# Patient Record
Sex: Female | Born: 1950 | Race: Black or African American | Hispanic: No | Smoking: Current every day smoker
Health system: Southern US, Community
[De-identification: ages and names within clinical notes are randomized; demographics above are authoritative.]

## PROBLEM LIST (undated history)

## (undated) DIAGNOSIS — I10 Essential (primary) hypertension: Secondary | ICD-10-CM

## (undated) DIAGNOSIS — M1990 Unspecified osteoarthritis, unspecified site: Secondary | ICD-10-CM

## (undated) DIAGNOSIS — F1010 Alcohol abuse, uncomplicated: Secondary | ICD-10-CM

## (undated) DIAGNOSIS — M549 Dorsalgia, unspecified: Secondary | ICD-10-CM

## (undated) DIAGNOSIS — F329 Major depressive disorder, single episode, unspecified: Secondary | ICD-10-CM

## (undated) DIAGNOSIS — R7303 Prediabetes: Secondary | ICD-10-CM

## (undated) DIAGNOSIS — S42302A Unspecified fracture of shaft of humerus, left arm, initial encounter for closed fracture: Secondary | ICD-10-CM

## (undated) DIAGNOSIS — K859 Acute pancreatitis without necrosis or infection, unspecified: Secondary | ICD-10-CM

## (undated) DIAGNOSIS — G8929 Other chronic pain: Secondary | ICD-10-CM

## (undated) DIAGNOSIS — K259 Gastric ulcer, unspecified as acute or chronic, without hemorrhage or perforation: Secondary | ICD-10-CM

## (undated) DIAGNOSIS — F419 Anxiety disorder, unspecified: Secondary | ICD-10-CM

## (undated) DIAGNOSIS — F32A Depression, unspecified: Secondary | ICD-10-CM

## (undated) DIAGNOSIS — C50912 Malignant neoplasm of unspecified site of left female breast: Secondary | ICD-10-CM

## (undated) DIAGNOSIS — K219 Gastro-esophageal reflux disease without esophagitis: Secondary | ICD-10-CM

## (undated) HISTORY — PX: TONSILLECTOMY: SUR1361

## (undated) HISTORY — PX: ABDOMINAL HYSTERECTOMY: SHX81

## (undated) HISTORY — PX: MASTECTOMY: SHX3

## (undated) HISTORY — PX: BREAST BIOPSY: SHX20

---

## 1997-07-12 ENCOUNTER — Emergency Department (HOSPITAL_COMMUNITY): Admission: EM | Admit: 1997-07-12 | Discharge: 1997-07-12 | Payer: Self-pay | Admitting: Emergency Medicine

## 1999-04-08 ENCOUNTER — Emergency Department (HOSPITAL_COMMUNITY): Admission: EM | Admit: 1999-04-08 | Discharge: 1999-04-08 | Payer: Self-pay | Admitting: Emergency Medicine

## 2000-09-01 ENCOUNTER — Other Ambulatory Visit: Admission: RE | Admit: 2000-09-01 | Discharge: 2000-09-01 | Payer: Self-pay | Admitting: Radiology

## 2000-09-13 ENCOUNTER — Other Ambulatory Visit: Admission: RE | Admit: 2000-09-13 | Discharge: 2000-09-13 | Payer: Self-pay | Admitting: Oncology

## 2000-09-13 ENCOUNTER — Encounter: Payer: Self-pay | Admitting: Oncology

## 2000-09-13 ENCOUNTER — Encounter: Admission: RE | Admit: 2000-09-13 | Discharge: 2000-09-13 | Payer: Self-pay | Admitting: Oncology

## 2000-10-20 ENCOUNTER — Other Ambulatory Visit: Admission: RE | Admit: 2000-10-20 | Discharge: 2000-10-20 | Payer: Self-pay | Admitting: Obstetrics and Gynecology

## 2000-10-25 ENCOUNTER — Encounter: Payer: Self-pay | Admitting: Obstetrics and Gynecology

## 2000-10-25 ENCOUNTER — Encounter: Admission: RE | Admit: 2000-10-25 | Discharge: 2000-10-25 | Payer: Self-pay | Admitting: Obstetrics and Gynecology

## 2000-10-26 ENCOUNTER — Inpatient Hospital Stay (HOSPITAL_COMMUNITY): Admission: RE | Admit: 2000-10-26 | Discharge: 2000-10-28 | Payer: Self-pay | Admitting: General Surgery

## 2000-10-26 ENCOUNTER — Encounter: Payer: Self-pay | Admitting: General Surgery

## 2001-01-04 ENCOUNTER — Encounter: Payer: Self-pay | Admitting: Oncology

## 2001-01-04 ENCOUNTER — Ambulatory Visit (HOSPITAL_COMMUNITY): Admission: RE | Admit: 2001-01-04 | Discharge: 2001-01-04 | Payer: Self-pay | Admitting: Oncology

## 2001-01-10 ENCOUNTER — Ambulatory Visit (HOSPITAL_COMMUNITY): Admission: RE | Admit: 2001-01-10 | Discharge: 2001-01-10 | Payer: Self-pay | Admitting: Oncology

## 2001-01-10 ENCOUNTER — Encounter: Payer: Self-pay | Admitting: Oncology

## 2001-02-10 ENCOUNTER — Inpatient Hospital Stay (HOSPITAL_COMMUNITY): Admission: RE | Admit: 2001-02-10 | Discharge: 2001-02-13 | Payer: Self-pay | Admitting: Obstetrics and Gynecology

## 2002-03-20 ENCOUNTER — Encounter: Payer: Self-pay | Admitting: General Surgery

## 2002-03-20 ENCOUNTER — Ambulatory Visit (HOSPITAL_COMMUNITY): Admission: RE | Admit: 2002-03-20 | Discharge: 2002-03-20 | Payer: Self-pay | Admitting: General Surgery

## 2002-04-25 ENCOUNTER — Encounter: Admission: RE | Admit: 2002-04-25 | Discharge: 2002-07-06 | Payer: Self-pay | Admitting: General Surgery

## 2002-07-24 ENCOUNTER — Ambulatory Visit (HOSPITAL_BASED_OUTPATIENT_CLINIC_OR_DEPARTMENT_OTHER): Admission: RE | Admit: 2002-07-24 | Discharge: 2002-07-24 | Payer: Self-pay | Admitting: Specialist

## 2002-08-24 ENCOUNTER — Ambulatory Visit (HOSPITAL_COMMUNITY): Admission: RE | Admit: 2002-08-24 | Discharge: 2002-08-24 | Payer: Self-pay | Admitting: Orthopedic Surgery

## 2002-08-29 ENCOUNTER — Encounter: Admission: RE | Admit: 2002-08-29 | Discharge: 2002-09-11 | Payer: Self-pay | Admitting: Orthopedic Surgery

## 2002-10-06 ENCOUNTER — Emergency Department (HOSPITAL_COMMUNITY): Admission: EM | Admit: 2002-10-06 | Discharge: 2002-10-06 | Payer: Self-pay | Admitting: Emergency Medicine

## 2005-03-19 ENCOUNTER — Emergency Department (HOSPITAL_COMMUNITY): Admission: EM | Admit: 2005-03-19 | Discharge: 2005-03-19 | Payer: Self-pay | Admitting: Emergency Medicine

## 2011-02-14 ENCOUNTER — Emergency Department (HOSPITAL_COMMUNITY)
Admission: EM | Admit: 2011-02-14 | Discharge: 2011-02-15 | Disposition: A | Discharge status: Home / Self Care | Payer: Medicaid Other | Attending: Emergency Medicine | Admitting: Emergency Medicine

## 2011-02-14 DIAGNOSIS — G8929 Other chronic pain: Secondary | ICD-10-CM | POA: Insufficient documentation

## 2011-02-14 DIAGNOSIS — M549 Dorsalgia, unspecified: Secondary | ICD-10-CM | POA: Insufficient documentation

## 2011-02-14 DIAGNOSIS — E119 Type 2 diabetes mellitus without complications: Secondary | ICD-10-CM | POA: Insufficient documentation

## 2011-02-14 DIAGNOSIS — M129 Arthropathy, unspecified: Secondary | ICD-10-CM | POA: Insufficient documentation

## 2011-02-14 DIAGNOSIS — I10 Essential (primary) hypertension: Secondary | ICD-10-CM | POA: Insufficient documentation

## 2011-02-14 DIAGNOSIS — F10929 Alcohol use, unspecified with intoxication, unspecified: Secondary | ICD-10-CM

## 2011-02-14 DIAGNOSIS — H543 Unqualified visual loss, both eyes: Secondary | ICD-10-CM | POA: Insufficient documentation

## 2011-02-14 DIAGNOSIS — R5381 Other malaise: Secondary | ICD-10-CM | POA: Insufficient documentation

## 2011-02-14 DIAGNOSIS — F1010 Alcohol abuse, uncomplicated: Secondary | ICD-10-CM | POA: Insufficient documentation

## 2011-02-14 HISTORY — DX: Unspecified osteoarthritis, unspecified site: M19.90

## 2011-02-14 HISTORY — DX: Essential (primary) hypertension: I10

## 2011-02-14 LAB — RAPID URINE DRUG SCREEN, HOSP PERFORMED
Amphetamines: NOT DETECTED
Benzodiazepines: NOT DETECTED
Opiates: NOT DETECTED

## 2011-02-14 LAB — COMPREHENSIVE METABOLIC PANEL
Alkaline Phosphatase: 71 U/L (ref 39–117)
BUN: 8 mg/dL (ref 6–23)
Calcium: 9.3 mg/dL (ref 8.4–10.5)
GFR calc Af Amer: >90 mL/min (ref >90)
Glucose, Bld: 97 mg/dL (ref 70–99)
Total Protein: 6.9 g/dL (ref 6.0–8.3)

## 2011-02-14 LAB — CBC
MCH: 29.8 pg (ref 26.0–34.0)
MCV: 84 fL (ref 78.0–100.0)
Platelets: 468 10*3/uL — ABNORMAL HIGH (ref 150–400)
RBC: 4.43 MIL/uL (ref 3.87–5.11)

## 2011-02-14 LAB — ETHANOL: Alcohol, Ethyl (B): 381 mg/dL — ABNORMAL HIGH (ref 0–11)

## 2011-02-15 ENCOUNTER — Encounter: Payer: Self-pay | Admitting: *Deleted

## 2011-02-15 NOTE — ED Notes (Signed)
Pt a/ox3, c/o chronic back pain, called husband to pick her up

## 2011-02-15 NOTE — ED Notes (Addendum)
Report given Anna Shell 

## 2011-02-15 NOTE — ED Notes (Signed)
Working with Linton Rump RN until she can log into epic

## 2011-02-15 NOTE — ED Notes (Signed)
Pt resting comfortably, NAD, awaiting disposition

## 2011-02-15 NOTE — ED Notes (Signed)
Pt resting quietly with eyes closed, continued monitoring,  NAD

## 2011-02-15 NOTE — ED Notes (Signed)
Entered in for EPIC go live only

## 2011-05-05 ENCOUNTER — Ambulatory Visit (HOSPITAL_COMMUNITY): Admission: RE | Admit: 2011-05-05 | Payer: Medicaid Other | Source: Ambulatory Visit

## 2011-05-18 ENCOUNTER — Ambulatory Visit (HOSPITAL_COMMUNITY): Payer: Medicaid Other

## 2011-05-18 ENCOUNTER — Ambulatory Visit (HOSPITAL_COMMUNITY): Payer: Medicaid Other | Attending: Internal Medicine

## 2011-05-18 DIAGNOSIS — R0602 Shortness of breath: Secondary | ICD-10-CM | POA: Insufficient documentation

## 2011-05-18 DIAGNOSIS — R5381 Other malaise: Secondary | ICD-10-CM | POA: Insufficient documentation

## 2011-05-19 ENCOUNTER — Telehealth: Payer: Self-pay

## 2011-05-19 NOTE — Telephone Encounter (Signed)
Patient husband called please contact him concerning a lot of questions he has about her medical conditions.

## 2011-05-20 NOTE — Telephone Encounter (Signed)
Spoke with pts fiance who vented to me at length about bad health care that pt has had in the past, but he seemed to like Dr Audria Nine and what she had done for pt. He had a question about ESR and what it showed but said he had talked w/ someone yesterday and gotten answer.

## 2012-04-13 DIAGNOSIS — K859 Acute pancreatitis without necrosis or infection, unspecified: Secondary | ICD-10-CM

## 2012-04-13 DIAGNOSIS — F1010 Alcohol abuse, uncomplicated: Secondary | ICD-10-CM

## 2012-04-13 HISTORY — DX: Acute pancreatitis without necrosis or infection, unspecified: K85.90

## 2012-04-13 HISTORY — DX: Alcohol abuse, uncomplicated: F10.10

## 2012-05-23 ENCOUNTER — Emergency Department (HOSPITAL_COMMUNITY): Payer: Medicaid Other

## 2012-05-23 ENCOUNTER — Encounter (HOSPITAL_COMMUNITY): Payer: Self-pay | Admitting: Emergency Medicine

## 2012-05-23 ENCOUNTER — Emergency Department (HOSPITAL_COMMUNITY)
Admission: EM | Admit: 2012-05-23 | Discharge: 2012-05-23 | Disposition: A | Discharge status: Home / Self Care | Payer: Medicaid Other | Attending: Emergency Medicine | Admitting: Emergency Medicine

## 2012-05-23 DIAGNOSIS — K859 Acute pancreatitis without necrosis or infection, unspecified: Secondary | ICD-10-CM | POA: Insufficient documentation

## 2012-05-23 DIAGNOSIS — R197 Diarrhea, unspecified: Secondary | ICD-10-CM | POA: Insufficient documentation

## 2012-05-23 DIAGNOSIS — Z8739 Personal history of other diseases of the musculoskeletal system and connective tissue: Secondary | ICD-10-CM | POA: Insufficient documentation

## 2012-05-23 DIAGNOSIS — R630 Anorexia: Secondary | ICD-10-CM | POA: Insufficient documentation

## 2012-05-23 DIAGNOSIS — R110 Nausea: Secondary | ICD-10-CM | POA: Insufficient documentation

## 2012-05-23 DIAGNOSIS — E119 Type 2 diabetes mellitus without complications: Secondary | ICD-10-CM | POA: Insufficient documentation

## 2012-05-23 DIAGNOSIS — I10 Essential (primary) hypertension: Secondary | ICD-10-CM | POA: Insufficient documentation

## 2012-05-23 DIAGNOSIS — R059 Cough, unspecified: Secondary | ICD-10-CM | POA: Insufficient documentation

## 2012-05-23 DIAGNOSIS — R948 Abnormal results of function studies of other organs and systems: Secondary | ICD-10-CM | POA: Insufficient documentation

## 2012-05-23 DIAGNOSIS — F1010 Alcohol abuse, uncomplicated: Secondary | ICD-10-CM | POA: Insufficient documentation

## 2012-05-23 DIAGNOSIS — Z853 Personal history of malignant neoplasm of breast: Secondary | ICD-10-CM | POA: Insufficient documentation

## 2012-05-23 DIAGNOSIS — R05 Cough: Secondary | ICD-10-CM | POA: Insufficient documentation

## 2012-05-23 DIAGNOSIS — H543 Unqualified visual loss, both eyes: Secondary | ICD-10-CM | POA: Insufficient documentation

## 2012-05-23 HISTORY — DX: Malignant neoplasm of unspecified site of left female breast: C50.912

## 2012-05-23 LAB — COMPREHENSIVE METABOLIC PANEL
Albumin: 4.2 g/dL (ref 3.5–5.2)
BUN: 8 mg/dL (ref 6–23)
Calcium: 10 mg/dL (ref 8.4–10.5)
Creatinine, Ser: 0.83 mg/dL (ref 0.50–1.10)
GFR calc Af Amer: 87 mL/min — ABNORMAL LOW (ref >90)
Total Protein: 8 g/dL (ref 6.0–8.3)

## 2012-05-23 LAB — LIPASE, BLOOD: Lipase: 67 U/L — ABNORMAL HIGH (ref 11–59)

## 2012-05-23 LAB — CBC WITH DIFFERENTIAL/PLATELET
Basophils Relative: 0 % (ref 0–1)
Eosinophils Absolute: 0.1 10*3/uL (ref 0.0–0.7)
Eosinophils Relative: 0 % (ref 0–5)
HCT: 45 % (ref 36.0–46.0)
Hemoglobin: 16.5 g/dL — ABNORMAL HIGH (ref 12.0–15.0)
MCH: 31.9 pg (ref 26.0–34.0)
MCHC: 36.7 g/dL — ABNORMAL HIGH (ref 30.0–36.0)
MCV: 87 fL (ref 78.0–100.0)
Monocytes Absolute: 0.7 10*3/uL (ref 0.1–1.0)
Monocytes Relative: 5 % (ref 3–12)
Neutrophils Relative %: 71 % (ref 43–77)

## 2012-05-23 MED ORDER — PROMETHAZINE HCL 25 MG PO TABS: 25.0000 mg | ORAL_TABLET | Freq: Four times a day (QID) | ORAL | Status: DC | PRN

## 2012-05-23 MED ORDER — SODIUM CHLORIDE 0.9 % IV BOLUS (SEPSIS)
1000.0000 mL | Freq: Once | INTRAVENOUS | Status: AC
  Administered 2012-05-23: 1000 mL via INTRAVENOUS

## 2012-05-23 MED ORDER — MORPHINE SULFATE 4 MG/ML IJ SOLN
4.0000 mg | Freq: Once | INTRAMUSCULAR | Status: AC
  Administered 2012-05-23: 4 mg via INTRAVENOUS
  Filled 2012-05-23: qty 1

## 2012-05-23 MED ORDER — ONDANSETRON HCL 4 MG/2ML IJ SOLN
4.0000 mg | Freq: Once | INTRAMUSCULAR | Status: AC
  Administered 2012-05-23: 4 mg via INTRAVENOUS
  Filled 2012-05-23: qty 2

## 2012-05-23 MED ORDER — IOHEXOL 300 MG/ML  SOLN
25.0000 mL | INTRAMUSCULAR | Status: AC
  Administered 2012-05-23 (×2): 25 mL via ORAL

## 2012-05-23 MED ORDER — IOHEXOL 300 MG/ML  SOLN
100.0000 mL | Freq: Once | INTRAMUSCULAR | Status: AC | PRN
  Administered 2012-05-23: 100 mL via INTRAVENOUS

## 2012-05-23 MED ORDER — OXYCODONE-ACETAMINOPHEN 5-325 MG PO TABS: 1.0000 | ORAL_TABLET | Freq: Four times a day (QID) | ORAL | Status: DC | PRN

## 2012-05-23 NOTE — ED Notes (Signed)
CT notified that pt has finished contrast.  

## 2012-05-23 NOTE — ED Provider Notes (Signed)
History     CSN: [id number]  Arrival date & time 05/23/12  1053   First MD Initiated Contact with Patient 05/23/12 1106      Chief Complaint  Patient presents with  . Abdominal Pain    (Consider location/radiation/quality/duration/timing/severity/associated sxs/prior treatment) Patient is a 62 y.o. female presenting with abdominal pain. The history is provided by the patient.  Abdominal Pain Associated symptoms: diarrhea and nausea   Associated symptoms: no chest pain, no fever, no shortness of breath and no vomiting    patient's had abdominal pain for last day and a half. States it is on her upper abdomen and goes to both flanks. It is worse on either flank if she lays on that side. She's had nausea and a decreased appetite. No fevers. She states she's had diarrhea. The pain is constant. She is eating less. She's had a cough with is chronic for her. No blood in stool or melena. She states she's been seen for this before and was told was her cholesterol. Pain does radiate to her back. Past Medical History  Diagnosis Date  . Arthritis   . Diabetes mellitus   . Hypertension   . Alcohol use   . Blindness   . Cancer   . Breast cancer, left breast     History reviewed. No pertinent past surgical history.  History reviewed. No pertinent family history.  History  Substance Use Topics  . Smoking status: Not on file  . Smokeless tobacco: Not on file  . Alcohol Use: Yes     Comment: heavy drinker    OB History   Grav Para Term Preterm Abortions TAB SAB Ect Mult Living                  Review of Systems  Constitutional: Positive for appetite change. Negative for fever and activity change.  HENT: Negative for neck stiffness.   Eyes: Negative for pain.  Respiratory: Negative for chest tightness and shortness of breath.   Cardiovascular: Negative for chest pain and leg swelling.  Gastrointestinal: Positive for nausea, abdominal pain and diarrhea. Negative for vomiting.   Endocrine: Negative for polyuria.  Genitourinary: Negative for flank pain.  Musculoskeletal: Negative for back pain.  Skin: Negative for rash.  Neurological: Negative for weakness, numbness and headaches.  Psychiatric/Behavioral: Negative for behavioral problems.    Allergies  Review of patient's allergies indicates no known allergies.  Home Medications   Current Outpatient Rx  Name  Route  Sig  Dispense  Refill  . ibuprofen (ADVIL,MOTRIN) 200 MG tablet   Oral   Take 600 mg by mouth every 6 (six) hours as needed for pain.         Marland Kitchen OVER THE COUNTER MEDICATION   Oral   Take 3 tablets by mouth at bedtime. Over the Counter sleep aid -- patient takes 3 tablets every night         . oxyCODONE-acetaminophen (PERCOCET/ROXICET) 5-325 MG per tablet   Oral   Take 1-2 tablets by mouth every 6 (six) hours as needed for pain.   15 tablet   0   . promethazine (PHENERGAN) 25 MG tablet   Oral   Take 1 tablet (25 mg total) by mouth every 6 (six) hours as needed for nausea.   20 tablet   0     BP 151/88  Pulse 98  Temp(Src) 99.2 F (37.3 C) (Oral)  Resp 14  SpO2 97%  Physical Exam  Nursing note and vitals reviewed.  Constitutional: She is oriented to person, place, and time. She appears well-developed and well-nourished.  HENT:  Head: Normocephalic and atraumatic.  Eyes: EOM are normal. Pupils are equal, round, and reactive to light.  Neck: Normal range of motion. Neck supple.  Cardiovascular: Normal rate, regular rhythm and normal heart sounds.   No murmur heard. Pulmonary/Chest: Effort normal. No respiratory distress. She has no wheezes. She has no rales.  Harsh breath sounds in bilateral lung fields  Abdominal: Soft. Bowel sounds are normal. She exhibits no distension. There is tenderness. There is no rebound and no guarding.  Minimal upper abdominal tenderness without CVA tenderness.  Musculoskeletal: Normal range of motion.  Neurological: She is alert and oriented to  person, place, and time. No cranial nerve deficit.  Skin: Skin is warm and dry.  Psychiatric: She has a normal mood and affect. Her speech is normal.    ED Course  Procedures (including critical care time)  Labs Reviewed  CBC WITH DIFFERENTIAL - Abnormal; Notable for the following:    WBC 14.1 (*)    RBC 5.17 (*)    Hemoglobin 16.5 (*)    MCHC 36.7 (*)    Platelets 403 (*)    Neutro Abs 10.0 (*)    All other components within normal limits  COMPREHENSIVE METABOLIC PANEL - Abnormal; Notable for the following:    Sodium 132 (*)    Chloride 95 (*)    Glucose, Bld 115 (*)    GFR calc non Af Amer 75 (*)    GFR calc Af Amer 87 (*)    All other components within normal limits  LIPASE, BLOOD - Abnormal; Notable for the following:    Lipase 67 (*)    All other components within normal limits   Dg Chest 2 View  05/23/2012  *RADIOLOGY REPORT*  Clinical Data: Cough.  CHEST - 2 VIEW  Comparison: None.  Findings: The heart size is normal.  The lungs are clear.  A port projects over the left axilla.  The patient is status post left mastectomy.  Surgical clips are present in the left axilla as well. The visualized soft tissues and bony thorax are otherwise unremarkable.  IMPRESSION:  1.  No acute cardiopulmonary disease. 2.  Status post left mastectomy and axillary node dissection.   Original Report Authenticated By: Marin Roberts, M.D.    Ct Abdomen Pelvis W Contrast  05/23/2012  *RADIOLOGY REPORT*  Clinical Data: generalized abdominal pain.  Elevated lipase.  CT ABDOMEN AND PELVIS WITH CONTRAST  Technique:  Multidetector CT imaging of the abdomen and pelvis was performed following the standard protocol during bolus administration of intravenous contrast.  Contrast: OMNIPAQUE IOHEXOL 300 MG/ML  SOLN  Comparison: None.  Findings: Lung bases are clear.  No effusions.  Heart is normal size.  Mild diffuse fatty infiltration of the liver. Small low-density area peripherally in the right  hepatic lobe, likely small cyst. There is calcification along the wall of the gallbladder fundus. No definite layering stones in the gallbladder.  There are is an ill-defined area of low density within the pancreatic head with surrounding inflammatory stranding.  Centrally within this area of irregular low density, there is a linear high density structure.  This may represent an area of focal acute pancreatitis superimposed on chronic pancreatitis.  The high density structure could conceivably represent a surgical clip. Recommend clinical correlation for prior instrumentation in the region of the pancreatic head.  Given the appearance of the pancreatic head, follow-up and  further characterization is recommended after symptoms resolve (1-2 months) with MRI to exclude underlying carcinoma.  Spleen, adrenals, kidneys are unremarkable except for small cortical cysts in the kidneys.  No hydronephrosis.  Appendix is visualized and is normal. Bowel grossly unremarkable. No free fluid, free air, or adenopathy.  Aorta and iliac vessels are calcified, non-aneurysmal.  No acute bony abnormality.  IMPRESSION: Focal area of low density within the pancreatic head could represent focal pancreatitis or pancreatic carcinoma.  There is a linear central high density structure, presumably calcification, but also could be a surgical clip or foreign body if there is been prior instrumentation in this area. Recommend further characterization and follow up after acute symptoms resolve (1-2 months ideally) with MRI to further assess this area and exclude underlying carcinoma.  Fatty infiltration of the liver.   Original Report Authenticated By: Charlett Nose, M.D.      1. Pancreatitis       MDM  Patient with abdominal pain nausea decreased appetite. Found to have pancreatitis on lab work with mildly elevated lipase. CT shows focal area of possible pancreatitis or carcinoma and pancreatic head. Patient was informed of this fact.  She's tolerated orals here and feels better. She was discharged home and will follow with her primary care Dr.        Juliet Rude. Rubin Payor, MD 05/23/12 2015

## 2012-05-23 NOTE — ED Notes (Signed)
Liquid Diet ordered spoke with [facility]

## 2012-05-23 NOTE — ED Notes (Signed)
Per EMS: pt c/o generalized abd pain into back; pt noncompliant with medications per EMS

## 2013-01-15 ENCOUNTER — Encounter (HOSPITAL_COMMUNITY): Payer: Self-pay | Admitting: *Deleted

## 2013-01-15 ENCOUNTER — Emergency Department (HOSPITAL_COMMUNITY)
Admission: EM | Admit: 2013-01-15 | Discharge: 2013-01-15 | Discharge status: Against Medical Advice | Payer: Medicaid Other | Attending: Emergency Medicine | Admitting: Emergency Medicine

## 2013-01-15 DIAGNOSIS — I10 Essential (primary) hypertension: Secondary | ICD-10-CM | POA: Insufficient documentation

## 2013-01-15 DIAGNOSIS — M129 Arthropathy, unspecified: Secondary | ICD-10-CM | POA: Insufficient documentation

## 2013-01-15 DIAGNOSIS — H539 Unspecified visual disturbance: Secondary | ICD-10-CM | POA: Insufficient documentation

## 2013-01-15 DIAGNOSIS — Z853 Personal history of malignant neoplasm of breast: Secondary | ICD-10-CM | POA: Insufficient documentation

## 2013-01-15 DIAGNOSIS — H0019 Chalazion unspecified eye, unspecified eyelid: Secondary | ICD-10-CM | POA: Insufficient documentation

## 2013-01-15 DIAGNOSIS — H5710 Ocular pain, unspecified eye: Secondary | ICD-10-CM | POA: Insufficient documentation

## 2013-01-15 DIAGNOSIS — Z79899 Other long term (current) drug therapy: Secondary | ICD-10-CM | POA: Insufficient documentation

## 2013-01-15 DIAGNOSIS — E119 Type 2 diabetes mellitus without complications: Secondary | ICD-10-CM | POA: Insufficient documentation

## 2013-01-15 DIAGNOSIS — Z7982 Long term (current) use of aspirin: Secondary | ICD-10-CM | POA: Insufficient documentation

## 2013-01-15 DIAGNOSIS — F17200 Nicotine dependence, unspecified, uncomplicated: Secondary | ICD-10-CM | POA: Insufficient documentation

## 2013-01-15 DIAGNOSIS — H0013 Chalazion right eye, unspecified eyelid: Secondary | ICD-10-CM

## 2013-01-15 MED ORDER — TETRACAINE HCL 0.5 % OP SOLN
2.0000 [drp] | Freq: Once | OPHTHALMIC | Status: AC
  Administered 2013-01-15: 2 [drp] via OPHTHALMIC
  Filled 2013-01-15: qty 2

## 2013-01-15 NOTE — ED Notes (Signed)
Pt stated that she was no longer going to allow treatment to her eye.  She stated it felt better and she was going to leave

## 2013-01-15 NOTE — ED Provider Notes (Signed)
CSN: [id number]     Arrival date & time 01/15/13  0127 History   First MD Initiated Contact with Patient 01/15/13 0146     Chief Complaint  Patient presents with  . Foreign Body in Eye   (Consider location/radiation/quality/duration/timing/severity/associated sxs/prior Treatment) HPI Comments: Patient states, that for the past 3, days.  She's had a foreign body sensation in her right eye it moves locations from upper to lower now.  She has some blurry vision, as well.  No exudate.  Oral erythema.  No known foreign bodies to the eye, but she states she was out in her garden when she first noticed this  Patient is a 62 y.o. female presenting with foreign body in eye. The history is provided by the patient.  Foreign Body in Eye This is a new problem. The current episode started in the past 7 days. The problem occurs constantly. The problem has been gradually worsening. Pertinent negatives include no congestion, fever or headaches. Exacerbated by: blinking. Treatments tried: Patient is irrigated with water x2. The treatment provided no relief.    Past Medical History  Diagnosis Date  . Arthritis   . Diabetes mellitus   . Hypertension   . Alcohol use   . Blindness   . Cancer   . Breast cancer, left breast    History reviewed. No pertinent past surgical history. History reviewed. No pertinent family history. History  Substance Use Topics  . Smoking status: Current Every Day Smoker  . Smokeless tobacco: Not on file  . Alcohol Use: Yes     Comment: heavy drinker   OB History   Grav Para Term Preterm Abortions TAB SAB Ect Mult Living                 Review of Systems  Constitutional: Negative for fever.  HENT: Negative for congestion and rhinorrhea.   Eyes: Positive for pain and visual disturbance. Negative for photophobia, discharge and redness.  Skin: Negative for wound.  Neurological: Negative for dizziness and headaches.  All other systems reviewed and are  negative.    Allergies  Review of patient's allergies indicates no known allergies.  Home Medications   Current Outpatient Rx  Name  Route  Sig  Dispense  Refill  . amLODipine (NORVASC) 10 MG tablet   Oral   Take 10 mg by mouth daily.         Marland Kitchen aspirin EC 81 MG tablet   Oral   Take 81 mg by mouth daily.         . hydrochlorothiazide (HYDRODIURIL) 25 MG tablet   Oral   Take 25 mg by mouth daily.         Marland Kitchen ibuprofen (ADVIL,MOTRIN) 200 MG tablet   Oral   Take 600 mg by mouth every 6 (six) hours as needed for pain.         Marland Kitchen lovastatin (MEVACOR) 20 MG tablet   Oral   Take 20 mg by mouth at bedtime.         Marland Kitchen OVER THE COUNTER MEDICATION   Oral   Take 3 tablets by mouth at bedtime. Over the Counter sleep aid -- patient takes 3 tablets every night         . oxyCODONE-acetaminophen (PERCOCET) 10-325 MG per tablet   Oral   Take 1-2 tablets by mouth every 6 (six) hours as needed for pain.         . promethazine (PHENERGAN) 25 MG tablet   Oral  Take 1 tablet (25 mg total) by mouth every 6 (six) hours as needed for nausea.   20 tablet   0    BP 112/69  Pulse 94  Temp(Src) 97.8 F (36.6 C) (Oral)  Resp 18  SpO2 96% Physical Exam  Nursing note and vitals reviewed. Constitutional: She is oriented to person, place, and time. She appears well-nourished.  HENT:  Head: Normocephalic.  Right Ear: External ear normal.  Left Ear: External ear normal.  Eyes: Pupils are equal, round, and reactive to light. Right eye exhibits no discharge and no hordeolum. Right conjunctiva is not injected. Right conjunctiva has no hemorrhage.    Cardiovascular: Normal rate.   Pulmonary/Chest: Effort normal.  Musculoskeletal: Normal range of motion.  Neurological: She is alert and oriented to person, place, and time.  Skin: Skin is warm and dry.    ED Course  Procedures (including critical care time) Labs Review Labs Reviewed - No data to display Imaging Review No  results found.  MDM  No diagnosis found.  Physical exam makes the believe this is a chalazion, but due to patient's symptoms, I will continue with irrigation and reassessment Patient left prior to completion of irrigation.    Arman Filter, NP 01/15/13 [phone]

## 2013-01-15 NOTE — ED Provider Notes (Signed)
Medical screening examination/treatment/procedure(s) were performed by non-physician practitioner and as supervising physician I was immediately available for consultation/collaboration.  Marcial Pless, MD 01/15/13 0711 

## 2013-01-15 NOTE — ED Notes (Signed)
Per EMS: pt states that she feels like she has a foreign body in her left eye.

## 2013-02-20 ENCOUNTER — Encounter (HOSPITAL_COMMUNITY): Payer: Self-pay | Admitting: Emergency Medicine

## 2013-02-20 ENCOUNTER — Emergency Department (HOSPITAL_COMMUNITY)
Admission: EM | Admit: 2013-02-20 | Discharge: 2013-02-20 | Disposition: A | Discharge status: Home / Self Care | Payer: Medicaid Other | Attending: Emergency Medicine | Admitting: Emergency Medicine

## 2013-02-20 DIAGNOSIS — R221 Localized swelling, mass and lump, neck: Secondary | ICD-10-CM

## 2013-02-20 DIAGNOSIS — Z853 Personal history of malignant neoplasm of breast: Secondary | ICD-10-CM | POA: Insufficient documentation

## 2013-02-20 DIAGNOSIS — Z8669 Personal history of other diseases of the nervous system and sense organs: Secondary | ICD-10-CM | POA: Insufficient documentation

## 2013-02-20 DIAGNOSIS — Z79899 Other long term (current) drug therapy: Secondary | ICD-10-CM | POA: Insufficient documentation

## 2013-02-20 DIAGNOSIS — J029 Acute pharyngitis, unspecified: Secondary | ICD-10-CM | POA: Insufficient documentation

## 2013-02-20 DIAGNOSIS — M129 Arthropathy, unspecified: Secondary | ICD-10-CM | POA: Insufficient documentation

## 2013-02-20 DIAGNOSIS — R109 Unspecified abdominal pain: Secondary | ICD-10-CM

## 2013-02-20 DIAGNOSIS — Z7982 Long term (current) use of aspirin: Secondary | ICD-10-CM | POA: Insufficient documentation

## 2013-02-20 DIAGNOSIS — F17200 Nicotine dependence, unspecified, uncomplicated: Secondary | ICD-10-CM | POA: Insufficient documentation

## 2013-02-20 DIAGNOSIS — R1084 Generalized abdominal pain: Secondary | ICD-10-CM | POA: Insufficient documentation

## 2013-02-20 DIAGNOSIS — I10 Essential (primary) hypertension: Secondary | ICD-10-CM | POA: Insufficient documentation

## 2013-02-20 DIAGNOSIS — E119 Type 2 diabetes mellitus without complications: Secondary | ICD-10-CM | POA: Insufficient documentation

## 2013-02-20 DIAGNOSIS — R220 Localized swelling, mass and lump, head: Secondary | ICD-10-CM | POA: Insufficient documentation

## 2013-02-20 LAB — URINALYSIS, ROUTINE W REFLEX MICROSCOPIC
Glucose, UA: NEGATIVE mg/dL
Ketones, ur: NEGATIVE mg/dL
Protein, ur: NEGATIVE mg/dL
pH: 5 (ref 5.0–8.0)

## 2013-02-20 LAB — COMPREHENSIVE METABOLIC PANEL
AST: 26 U/L (ref 0–37)
Albumin: 4.1 g/dL (ref 3.5–5.2)
Alkaline Phosphatase: 79 U/L (ref 39–117)
BUN: 7 mg/dL (ref 6–23)
CO2: 23 mEq/L (ref 19–32)
Chloride: 97 mEq/L (ref 96–112)
Creatinine, Ser: 0.73 mg/dL (ref 0.50–1.10)
GFR calc non Af Amer: >90 mL/min (ref >90)
Potassium: 4.6 mEq/L (ref 3.5–5.1)
Total Bilirubin: 0.8 mg/dL (ref 0.3–1.2)

## 2013-02-20 LAB — URINE MICROSCOPIC-ADD ON

## 2013-02-20 LAB — CBC WITH DIFFERENTIAL/PLATELET
Basophils Absolute: 0 10*3/uL (ref 0.0–0.1)
HCT: 41.8 % (ref 36.0–46.0)
Hemoglobin: 15.4 g/dL — ABNORMAL HIGH (ref 12.0–15.0)
Lymphocytes Relative: 28 % (ref 12–46)
Monocytes Absolute: 0.8 10*3/uL (ref 0.1–1.0)
Monocytes Relative: 6 % (ref 3–12)
Neutro Abs: 8 10*3/uL — ABNORMAL HIGH (ref 1.7–7.7)
Neutrophils Relative %: 65 % (ref 43–77)
WBC: 12.3 10*3/uL — ABNORMAL HIGH (ref 4.0–10.5)

## 2013-02-20 NOTE — ED Notes (Signed)
Pt c/o swollen gland on right side of throat with pain x months; pt c/o generalized abd pain and bloating x 3 days with nausea

## 2013-02-21 LAB — URINE CULTURE

## 2013-02-21 NOTE — ED Provider Notes (Signed)
Medical screening examination/treatment/procedure(s) were conducted as a shared visit with non-physician practitioner(s) or resident  and myself.  I personally evaluated the patient during the encounter and agree with the findings and plan unless otherwise indicated.    I have personally reviewed any xrays and/ or EKG's with the provider and I agree with interpretation.   Swelling to right neck mobile non tender, no erythema or induration, benign appearing - fup closely outpt discussed.l Abd pain resolved, abd soft/ NT/ ND - mild lipase elevation, outpt fup discussed.  Abdominal pain, right neck swelling  Enid Skeens, MD 02/21/13 0157

## 2013-02-21 NOTE — ED Provider Notes (Signed)
CSN: [id number]     Arrival date & time 02/20/13  1540 History   First MD Initiated Contact with Patient 02/20/13 2014     Chief Complaint  Patient presents with  . Abdominal Pain  . Sore Throat   (Consider location/radiation/quality/duration/timing/severity/associated sxs/prior Treatment) The history is provided by the patient. No language interpreter was used.   Patient is 62 year old African American female with past medical history of hypertension who comes emergency department today with generalized abdominal pain and swelling to the right neck. She's noted swelling for several months she feels that this change in size. It is nontender.  It is mobile. She's not had any difficulty breathing, swallowing, or speaking. She's not had any drooling. She describes abdominal pain as a distention. She states after she and she feels that she is full. She had a similar complaint several months ago and was told that she was suffering from pancreatitis at that time. She's not had any fevers, chills, nausea, vomiting, diarrhea, constipation, dysuria, frequency, or urgency. She currently denies abdominal pain.  Past Medical History  Diagnosis Date  . Arthritis   . Diabetes mellitus   . Hypertension   . Alcohol use   . Blindness   . Cancer   . Breast cancer, left breast    History reviewed. No pertinent past surgical history. History reviewed. No pertinent family history. History  Substance Use Topics  . Smoking status: Current Every Day Smoker  . Smokeless tobacco: Not on file  . Alcohol Use: Yes     Comment: heavy drinker   OB History   Grav Para Term Preterm Abortions TAB SAB Ect Mult Living                 Review of Systems  Constitutional: Negative for fever and chills.  HENT: Negative for drooling, sore throat, trouble swallowing and voice change.   Respiratory: Negative for cough and shortness of breath.   Gastrointestinal: Positive for abdominal distention. Negative for nausea,  vomiting, abdominal pain, diarrhea and constipation.  Genitourinary: Negative for dysuria, urgency and frequency.  Neurological: Negative for weakness, numbness and headaches.  All other systems reviewed and are negative.    Allergies  Review of patient's allergies indicates no known allergies.  Home Medications   Current Outpatient Rx  Name  Route  Sig  Dispense  Refill  . amLODipine (NORVASC) 10 MG tablet   Oral   Take 10 mg by mouth daily.         Marland Kitchen aspirin EC 81 MG tablet   Oral   Take 81 mg by mouth daily.         . hydrochlorothiazide (HYDRODIURIL) 25 MG tablet   Oral   Take 25 mg by mouth daily.         Marland Kitchen ibuprofen (ADVIL,MOTRIN) 200 MG tablet   Oral   Take 600 mg by mouth every 6 (six) hours as needed for pain.         Marland Kitchen lovastatin (MEVACOR) 20 MG tablet   Oral   Take 20 mg by mouth at bedtime.         . naproxen (NAPROSYN) 500 MG tablet   Oral   Take 500 mg by mouth 2 (two) times daily with a meal.         . OVER THE COUNTER MEDICATION   Oral   Take 3 tablets by mouth at bedtime. Over the Counter sleep aid -- patient takes 3 tablets every night         .  oxyCODONE-acetaminophen (PERCOCET) 10-325 MG per tablet   Oral   Take 1-2 tablets by mouth every 6 (six) hours as needed for pain.         . promethazine (PHENERGAN) 25 MG tablet   Oral   Take 1 tablet (25 mg total) by mouth every 6 (six) hours as needed for nausea.   20 tablet   0   . venlafaxine (EFFEXOR) 75 MG tablet   Oral   Take 75 mg by mouth daily.          BP 128/79  Pulse 96  Temp(Src) 98.6 F (37 C) (Oral)  Resp 20  Ht 5\' 7"  (1.702 m)  Wt 138 lb 4.8 oz (62.732 kg)  BMI 21.66 kg/m2  SpO2 98% Physical Exam  Nursing note and vitals reviewed. Constitutional: She is oriented to person, place, and time. She appears well-developed and well-nourished. No distress.  HENT:  Head: Normocephalic and atraumatic.  Eyes: Pupils are equal, round, and reactive to light.   Neck: Normal range of motion.  3 cm non-tender, freely mobile mass to the right neck.    Cardiovascular: Normal rate, regular rhythm, normal heart sounds and intact distal pulses.   Pulmonary/Chest: Effort normal. No respiratory distress. She has no wheezes. She exhibits no tenderness.  Abdominal: Soft. Bowel sounds are normal. She exhibits no distension. There is no tenderness. There is no rebound and no guarding.  Neurological: She is alert and oriented to person, place, and time.  Skin: Skin is warm and dry.    ED Course  Procedures (including critical care time) Labs Review Labs Reviewed  CBC WITH DIFFERENTIAL - Abnormal; Notable for the following:    WBC 12.3 (*)    Hemoglobin 15.4 (*)    MCHC 36.8 (*)    Platelets 404 (*)    Neutro Abs 8.0 (*)    All other components within normal limits  COMPREHENSIVE METABOLIC PANEL - Abnormal; Notable for the following:    Sodium 133 (*)    Glucose, Bld 126 (*)    All other components within normal limits  LIPASE, BLOOD - Abnormal; Notable for the following:    Lipase 111 (*)    All other components within normal limits  URINALYSIS, ROUTINE W REFLEX MICROSCOPIC - Abnormal; Notable for the following:    Color, Urine AMBER (*)    Bilirubin Urine SMALL (*)    Leukocytes, UA MODERATE (*)    All other components within normal limits  URINE MICROSCOPIC-ADD ON - Abnormal; Notable for the following:    Squamous Epithelial / LPF FEW (*)    Bacteria, UA FEW (*)    All other components within normal limits  URINE CULTURE   Imaging Review No results found.  EKG Interpretation   None       MDM  Patient is 62 year old American female with past medical history of hypertension who comes emergency department today with a mass to the right side of her neck and abdominal distention. Physical exam as above. Initial workup included a UA, CBC, CMP, and a lipase.  With benign abdominal exam patient is not felt to require abdominal imaging at this  time. Patient was initially tachycardic to 116 the emergency department she was observed in the emergency department and her heart rate decreased to 94 without intervention.  Lipase is 111. CMP had a set of 133 otherwise unremarkable. CBC had WBC of 12.3 unremarkable. UA had a few bacteria and a few epithelial cells. Since patient is asymptomatic  doubt urinary tract infection. Urine culture was sent. Patient will be called if she requires antibiotic treatment.  With benign abdominal exam and unremarkable labs doubt pancreatitis, acute cholecystitis, or appendicitis. Since the mass is freely mobile, nontender, and has been present for several months was not felt to require evaluation for this at this time. Patient is currently in the process of obtaining a primary care physician. She was instructed to followup with her primary care physician as soon as possible to evaluate for the mass. She was instructed to return to emergency department if she develops worsening pain, nausea, vomiting, or any other concerns. Ms. expressed understanding. She was discharged in stable condition. Labs reviewed by myself and considered and medical decision-making. Care was discussed with my attending Dr. Jodi Mourning.    1. Mass of right side of neck   2. Abdominal discomfort      Bethann Berkshire, MD 02/21/13 [phone]

## 2013-04-03 ENCOUNTER — Other Ambulatory Visit: Payer: Self-pay | Admitting: Internal Medicine

## 2013-04-03 DIAGNOSIS — Z1231 Encounter for screening mammogram for malignant neoplasm of breast: Secondary | ICD-10-CM

## 2015-09-22 ENCOUNTER — Emergency Department (HOSPITAL_COMMUNITY): Payer: Medicaid Other

## 2015-09-22 ENCOUNTER — Encounter (HOSPITAL_COMMUNITY): Payer: Self-pay | Admitting: *Deleted

## 2015-09-22 ENCOUNTER — Emergency Department (HOSPITAL_COMMUNITY)
Admission: EM | Admit: 2015-09-22 | Discharge: 2015-09-22 | Disposition: A | Discharge status: Home / Self Care | Payer: Medicaid Other | Attending: Emergency Medicine | Admitting: Emergency Medicine

## 2015-09-22 DIAGNOSIS — F10129 Alcohol abuse with intoxication, unspecified: Secondary | ICD-10-CM | POA: Diagnosis not present

## 2015-09-22 DIAGNOSIS — M1990 Unspecified osteoarthritis, unspecified site: Secondary | ICD-10-CM | POA: Insufficient documentation

## 2015-09-22 DIAGNOSIS — Z853 Personal history of malignant neoplasm of breast: Secondary | ICD-10-CM | POA: Diagnosis not present

## 2015-09-22 DIAGNOSIS — I959 Hypotension, unspecified: Secondary | ICD-10-CM | POA: Diagnosis present

## 2015-09-22 DIAGNOSIS — R4182 Altered mental status, unspecified: Secondary | ICD-10-CM | POA: Insufficient documentation

## 2015-09-22 DIAGNOSIS — F17200 Nicotine dependence, unspecified, uncomplicated: Secondary | ICD-10-CM | POA: Diagnosis not present

## 2015-09-22 DIAGNOSIS — E119 Type 2 diabetes mellitus without complications: Secondary | ICD-10-CM | POA: Diagnosis not present

## 2015-09-22 DIAGNOSIS — F10920 Alcohol use, unspecified with intoxication, uncomplicated: Secondary | ICD-10-CM

## 2015-09-22 LAB — ETHANOL: Alcohol, Ethyl (B): 259 mg/dL — ABNORMAL HIGH (ref <5)

## 2015-09-22 MED ORDER — NALOXONE HCL 0.4 MG/ML IJ SOLN
0.4000 mg | Freq: Once | INTRAMUSCULAR | Status: AC
  Administered 2015-09-22: 0.4 mg via INTRAVENOUS
  Filled 2015-09-22: qty 1
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
## 2015-09-22 NOTE — ED Notes (Signed)
amb in hallway to BR w/o difficulty

## 2015-09-22 NOTE — ED Notes (Signed)
Pt to CT via stretcher
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
## 2015-09-22 NOTE — ED Notes (Signed)
Oxycodone 5/325 secured in pharmacy.
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
## 2015-09-22 NOTE — ED Notes (Signed)
Pt found have low sat and hypotension, pt placed on 2L [place], CCM and attempt made to give fluid bolus. IV in R hand started by EMS found to be nonfunctioning. IV catheter removed, tip intact. Will attempt to establish another IV for fluid administration.
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
## 2015-09-22 NOTE — Discharge Instructions (Signed)
Alcohol Intoxication Emily Cohen, call the numbers below to get help with your alcohol use.  See a primary care doctor within 3 days for close follow up. If any symptoms worsen, come back to the ED immediately. Thank you. Alcohol intoxication occurs when you drink enough alcohol that it affects your ability to function. It can be mild or very severe. Drinking a lot of alcohol in a short time is called binge drinking. This can be very harmful. Drinking alcohol can also be more dangerous if you are taking medicines or other drugs. Some of the effects caused by alcohol may include:  Loss of coordination.  Changes in mood and behavior.  Unclear thinking.  Trouble talking (slurred speech).  Throwing up (vomiting).  Confusion.  Slowed breathing.  Twitching and shaking (seizures).  Loss of consciousness. HOME CARE  Do not drive after drinking alcohol.  Drink enough water and fluids to keep your pee (urine) clear or pale yellow. Avoid caffeine.  Only take medicine as told by your doctor. GET HELP IF:  You throw up (vomit) many times.  You do not feel better after a few days.  You frequently have alcohol intoxication. Your doctor can help decide if you should see a substance use treatment counselor. GET HELP RIGHT AWAY IF:  You become shaky when you stop drinking.  You have twitching and shaking.  You throw up blood. It may look bright red or like coffee grounds.  You notice blood in your poop (bowel movements).  You become lightheaded or pass out (faint). MAKE SURE YOU:   Understand these instructions.  Will watch your condition.  Will get help right away if you are not doing well or get worse.   This information is not intended to replace advice given to you by your health care provider. Make sure you discuss any questions you have with your health care provider.   Document Released: 09/16/2007 Document Revised: 11/30/2012 Document Reviewed: 09/02/2012 Elsevier  Interactive Patient Education [employer].
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
## 2016-01-12 DIAGNOSIS — S42302A Unspecified fracture of shaft of humerus, left arm, initial encounter for closed fracture: Secondary | ICD-10-CM

## 2016-01-12 HISTORY — DX: Unspecified fracture of shaft of humerus, left arm, initial encounter for closed fracture: S42.302A

## 2016-01-24 ENCOUNTER — Encounter (HOSPITAL_COMMUNITY): Payer: Self-pay | Admitting: *Deleted

## 2016-01-24 ENCOUNTER — Emergency Department (HOSPITAL_COMMUNITY)
Admission: EM | Admit: 2016-01-24 | Discharge: 2016-01-24 | Disposition: A | Discharge status: Home / Self Care | Payer: Medicaid Other | Attending: Emergency Medicine | Admitting: Emergency Medicine

## 2016-01-24 ENCOUNTER — Emergency Department (HOSPITAL_COMMUNITY): Payer: Medicaid Other

## 2016-01-24 DIAGNOSIS — Z791 Long term (current) use of non-steroidal anti-inflammatories (NSAID): Secondary | ICD-10-CM | POA: Insufficient documentation

## 2016-01-24 DIAGNOSIS — Y939 Activity, unspecified: Secondary | ICD-10-CM | POA: Insufficient documentation

## 2016-01-24 DIAGNOSIS — E119 Type 2 diabetes mellitus without complications: Secondary | ICD-10-CM | POA: Insufficient documentation

## 2016-01-24 DIAGNOSIS — F17200 Nicotine dependence, unspecified, uncomplicated: Secondary | ICD-10-CM | POA: Insufficient documentation

## 2016-01-24 DIAGNOSIS — Z853 Personal history of malignant neoplasm of breast: Secondary | ICD-10-CM | POA: Insufficient documentation

## 2016-01-24 DIAGNOSIS — Z79899 Other long term (current) drug therapy: Secondary | ICD-10-CM | POA: Insufficient documentation

## 2016-01-24 DIAGNOSIS — Y929 Unspecified place or not applicable: Secondary | ICD-10-CM | POA: Insufficient documentation

## 2016-01-24 DIAGNOSIS — Z7982 Long term (current) use of aspirin: Secondary | ICD-10-CM | POA: Diagnosis not present

## 2016-01-24 DIAGNOSIS — I10 Essential (primary) hypertension: Secondary | ICD-10-CM | POA: Insufficient documentation

## 2016-01-24 DIAGNOSIS — S4992XA Unspecified injury of left shoulder and upper arm, initial encounter: Secondary | ICD-10-CM | POA: Diagnosis present

## 2016-01-24 DIAGNOSIS — S42292A Other displaced fracture of upper end of left humerus, initial encounter for closed fracture: Secondary | ICD-10-CM | POA: Insufficient documentation

## 2016-01-24 DIAGNOSIS — W010XXA Fall on same level from slipping, tripping and stumbling without subsequent striking against object, initial encounter: Secondary | ICD-10-CM | POA: Insufficient documentation

## 2016-01-24 DIAGNOSIS — Y999 Unspecified external cause status: Secondary | ICD-10-CM | POA: Insufficient documentation

## 2016-01-24 MED ORDER — FENTANYL CITRATE (PF) 100 MCG/2ML IJ SOLN
50.0000 ug | Freq: Once | INTRAMUSCULAR | Status: AC
  Administered 2016-01-24: 50 ug via INTRAMUSCULAR
  Filled 2016-01-24: qty 2

## 2016-01-24 NOTE — ED Triage Notes (Signed)
Pt complains of left upper arm pain since falling on her arm yesterday. Pt states she lost her balance while getting up to go to the bathroom.

## 2016-01-24 NOTE — Discharge Instructions (Signed)
Follow up with Dr. Ninfa Linden as soon as possible. Take the oxycodone you have at home as needed for pain. Return to the ER for new or worsening symptoms.

## 2016-01-24 NOTE — Progress Notes (Signed)
Pt states she fell this am going to the BR and now has pain in her right humerus area. Ice applied and pt given blankets. She stated the pain is a 10/10 and pt does appear to guard the extremity.

## 2016-01-24 NOTE — ED Provider Notes (Signed)
[facility] DEPT Provider Note   CSN: [id number] Arrival date & time: 01/24/16  1253   By signing my name below, I, Delton Prairie, attest that this documentation has been prepared under the direction and in the presence of  Brennen Camper, [place]. Electronically Signed: Delton Prairie, ED Scribe. 01/24/16. 2:58 PM.   History   Chief Complaint Chief Complaint  Patient presents with  . Arm Pain    The history is provided by the patient. No language interpreter was used.   HPI Comments:  Emily Cohen is a 65 y.o. female who presents to the Emergency Department complaining of left upper arm pain s/p a fall which occurred in the AM today. Pt states she tripped and she fell onto her L side. She denies head injury or syncope. Pt has taken home oxycodone with no relief. Denies numbness or weakness. She denies any other complaints at this time.  Past Medical History:  Diagnosis Date  . Alcohol use   . Arthritis   . Blindness   . Breast cancer, left breast ([place])   . Cancer ([place])   . Diabetes mellitus   . Hypertension     There are no active problems to display for this patient.   History reviewed. No pertinent surgical history.  OB History    No data available       Home Medications    Prior to Admission medications   Medication Sig Start Date End Date Taking? Authorizing Provider  amLODipine (NORVASC) 10 MG tablet Take 10 mg by mouth daily.    Historical Provider, MD  aspirin EC 81 MG tablet Take 81 mg by mouth daily.    Historical Provider, MD  hydrochlorothiazide (HYDRODIURIL) 25 MG tablet Take 25 mg by mouth daily.    Historical Provider, MD  ibuprofen (ADVIL,MOTRIN) 200 MG tablet Take 600 mg by mouth every 6 (six) hours as needed for pain.    Historical Provider, MD  lovastatin (MEVACOR) 20 MG tablet Take 20 mg by mouth at bedtime.    Historical Provider, MD  naproxen (NAPROSYN) 500 MG tablet Take 500 mg by mouth 2 (two) times daily with a meal.    Historical  Provider, MD  ondansetron (ZOFRAN) 4 MG tablet Take 4 mg by mouth every 8 (eight) hours as needed for nausea or vomiting.    Historical Provider, MD  OVER THE COUNTER MEDICATION Take 3 tablets by mouth at bedtime. Over the Counter sleep aid -- patient takes 3 tablets every night    Historical Provider, MD  oxyCODONE-acetaminophen (PERCOCET) 10-325 MG per tablet Take 1-2 tablets by mouth every 6 (six) hours as needed for pain.    Historical Provider, MD  promethazine (PHENERGAN) 25 MG tablet Take 1 tablet (25 mg total) by mouth every 6 (six) hours as needed for nausea. Patient taking differently: Take 12.5-25 mg by mouth every 6 (six) hours as needed for nausea.  05/23/12   Davonna Belling, MD  venlafaxine XR (EFFEXOR-XR) 75 MG 24 hr capsule Take 75 mg by mouth daily with breakfast.    Historical Provider, MD    Family History No family history on file.  Social History Social History  Substance Use Topics  . Smoking status: Current Every Day Smoker  . Smokeless tobacco: Never Used  . Alcohol use Yes     Comment: heavy drinker     Allergies   Review of patient's allergies indicates no known allergies.   Review of Systems Review of Systems   10 Systems  reviewed and are negative for acute change except as noted in the HPI.  Physical Exam Updated Vital Signs BP 126/87 (BP Location: Right Arm)   Pulse 98   Temp 98.5 F (36.9 C) (Oral)   Resp 18   SpO2 100%   Physical Exam  Constitutional: She is oriented to person, place, and time. She appears well-developed and well-nourished. No distress.  NAD.  HENT:  Head: Normocephalic and atraumatic.  Eyes: Conjunctivae and EOM are normal. Pupils are equal, round, and reactive to light.  Neck: Normal range of motion.  No c-spine tenderness  Cardiovascular: Normal rate and regular rhythm.   Pulmonary/Chest: Effort normal.  Abdominal: She exhibits no distension.  Musculoskeletal: She exhibits tenderness. She exhibits no edema.    Marked tenderness of  L upper arm. Limited ROM at shoulder and elbow due to pain. FROM of wrist. 5/5 grip strength. 2+ radial pulse. Brisk cap refill x 5. No midline back tenderness no stepoff or deformity  Neurological: She is alert and oriented to person, place, and time.  Skin: Skin is warm and dry.  No skin discoloration or swelling.   Psychiatric: She has a normal mood and affect.  Nursing note and vitals reviewed.    ED Treatments / Results  DIAGNOSTIC STUDIES:  Oxygen Saturation is 100% on RA, normal by my interpretation.    COORDINATION OF CARE:  2:56 PM Discussed treatment plan with pt at bedside and pt agreed to plan.  Labs (all labs ordered are listed, but only abnormal results are displayed) Labs Reviewed - No data to display  EKG  EKG Interpretation None       Radiology Dg Humerus Left  Result Date: 01/24/2016 CLINICAL DATA:  Fall yesterday EXAM: LEFT HUMERUS - 2+ VIEW COMPARISON:  None. FINDINGS: Transverse fracture proximal humerus below the humeral neck. 1 shaft width of medial displacement. Mild angulation. Shoulder joint appears normal. Mild degenerative change in the [facility] joint. IMPRESSION: Displaced fracture proximal humerus. Electronically Signed   By: Franchot Gallo M.D.   On: 01/24/2016 13:52    Procedures Procedures (including critical care time)  Medications Ordered in ED Medications - No data to display   Initial Impression / Assessment and Plan / ED Course  I have reviewed the triage vital signs and the nursing notes.  Pertinent labs & imaging results that were available during my care of the patient were reviewed by me and considered in my medical decision making (see chart for details).  Clinical Course    Patient X-Ray positive for obvious fracture. Discussed with Dr. Ninfa Linden of ortho. As expected, recommend place in sling and f/u in clinic. Patient given sling while in ED, she has oxycodone at home for chronic pain. Encouraged close  f/u with ortho. Patient will be discharged home & is agreeable with above plan. Return precautions discussed. Pt appears safe for discharge.   Final Clinical Impressions(s) / ED Diagnoses   Final diagnoses:  Other closed displaced fracture of proximal end of left humerus, initial encounter    New Prescriptions Discharge Medication List as of 01/24/2016  4:03 PM     I personally performed the services described in this documentation, which was scribed in my presence. The recorded information has been reviewed and is accurate.    Anne Ng, PA-C 01/24/16 [place], MD 01/24/16 [phone]

## 2016-05-14 DIAGNOSIS — K259 Gastric ulcer, unspecified as acute or chronic, without hemorrhage or perforation: Secondary | ICD-10-CM

## 2016-05-14 HISTORY — DX: Gastric ulcer, unspecified as acute or chronic, without hemorrhage or perforation: K25.9

## 2016-05-14 HISTORY — PX: ESOPHAGOGASTRODUODENOSCOPY: SHX1529

## 2016-05-22 ENCOUNTER — Emergency Department (HOSPITAL_COMMUNITY): Payer: Medicaid Other

## 2016-05-22 ENCOUNTER — Inpatient Hospital Stay (HOSPITAL_COMMUNITY): Payer: Medicaid Other

## 2016-05-22 ENCOUNTER — Inpatient Hospital Stay (HOSPITAL_COMMUNITY)
Admission: EM | Admit: 2016-05-22 | Discharge: 2016-05-26 | DRG: 377 | Disposition: A | Discharge status: Home Health | Payer: Medicaid Other | Attending: Internal Medicine | Admitting: Internal Medicine

## 2016-05-22 ENCOUNTER — Encounter (HOSPITAL_COMMUNITY): Payer: Self-pay

## 2016-05-22 DIAGNOSIS — G9341 Metabolic encephalopathy: Secondary | ICD-10-CM | POA: Diagnosis present

## 2016-05-22 DIAGNOSIS — N179 Acute kidney failure, unspecified: Secondary | ICD-10-CM | POA: Diagnosis present

## 2016-05-22 DIAGNOSIS — Z853 Personal history of malignant neoplasm of breast: Secondary | ICD-10-CM | POA: Diagnosis not present

## 2016-05-22 DIAGNOSIS — S42309A Unspecified fracture of shaft of humerus, unspecified arm, initial encounter for closed fracture: Secondary | ICD-10-CM

## 2016-05-22 DIAGNOSIS — K254 Chronic or unspecified gastric ulcer with hemorrhage: Secondary | ICD-10-CM | POA: Diagnosis present

## 2016-05-22 DIAGNOSIS — S42202A Unspecified fracture of upper end of left humerus, initial encounter for closed fracture: Secondary | ICD-10-CM | POA: Diagnosis present

## 2016-05-22 DIAGNOSIS — G9340 Encephalopathy, unspecified: Secondary | ICD-10-CM | POA: Diagnosis not present

## 2016-05-22 DIAGNOSIS — Y92019 Unspecified place in single-family (private) house as the place of occurrence of the external cause: Secondary | ICD-10-CM | POA: Diagnosis not present

## 2016-05-22 DIAGNOSIS — Z9012 Acquired absence of left breast and nipple: Secondary | ICD-10-CM

## 2016-05-22 DIAGNOSIS — D62 Acute posthemorrhagic anemia: Secondary | ICD-10-CM | POA: Diagnosis present

## 2016-05-22 DIAGNOSIS — R680 Hypothermia, not associated with low environmental temperature: Secondary | ICD-10-CM | POA: Diagnosis present

## 2016-05-22 DIAGNOSIS — R296 Repeated falls: Secondary | ICD-10-CM

## 2016-05-22 DIAGNOSIS — D72829 Elevated white blood cell count, unspecified: Secondary | ICD-10-CM | POA: Diagnosis present

## 2016-05-22 DIAGNOSIS — K250 Acute gastric ulcer with hemorrhage: Secondary | ICD-10-CM | POA: Diagnosis not present

## 2016-05-22 DIAGNOSIS — Z79899 Other long term (current) drug therapy: Secondary | ICD-10-CM

## 2016-05-22 DIAGNOSIS — I10 Essential (primary) hypertension: Secondary | ICD-10-CM | POA: Diagnosis present

## 2016-05-22 DIAGNOSIS — H547 Unspecified visual loss: Secondary | ICD-10-CM | POA: Diagnosis present

## 2016-05-22 DIAGNOSIS — W1830XA Fall on same level, unspecified, initial encounter: Secondary | ICD-10-CM | POA: Diagnosis present

## 2016-05-22 DIAGNOSIS — E119 Type 2 diabetes mellitus without complications: Secondary | ICD-10-CM | POA: Diagnosis present

## 2016-05-22 DIAGNOSIS — Z791 Long term (current) use of non-steroidal anti-inflammatories (NSAID): Secondary | ICD-10-CM

## 2016-05-22 DIAGNOSIS — K922 Gastrointestinal hemorrhage, unspecified: Secondary | ICD-10-CM

## 2016-05-22 DIAGNOSIS — E878 Other disorders of electrolyte and fluid balance, not elsewhere classified: Secondary | ICD-10-CM | POA: Diagnosis present

## 2016-05-22 DIAGNOSIS — K920 Hematemesis: Secondary | ICD-10-CM | POA: Diagnosis present

## 2016-05-22 DIAGNOSIS — J9690 Respiratory failure, unspecified, unspecified whether with hypoxia or hypercapnia: Secondary | ICD-10-CM

## 2016-05-22 DIAGNOSIS — Z4659 Encounter for fitting and adjustment of other gastrointestinal appliance and device: Secondary | ICD-10-CM

## 2016-05-22 DIAGNOSIS — Z7982 Long term (current) use of aspirin: Secondary | ICD-10-CM | POA: Diagnosis not present

## 2016-05-22 DIAGNOSIS — F1010 Alcohol abuse, uncomplicated: Secondary | ICD-10-CM | POA: Diagnosis present

## 2016-05-22 DIAGNOSIS — Z79891 Long term (current) use of opiate analgesic: Secondary | ICD-10-CM | POA: Diagnosis not present

## 2016-05-22 DIAGNOSIS — F17200 Nicotine dependence, unspecified, uncomplicated: Secondary | ICD-10-CM | POA: Diagnosis present

## 2016-05-22 DIAGNOSIS — R000 Tachycardia, unspecified: Secondary | ICD-10-CM | POA: Diagnosis present

## 2016-05-22 DIAGNOSIS — I4891 Unspecified atrial fibrillation: Secondary | ICD-10-CM | POA: Diagnosis present

## 2016-05-22 DIAGNOSIS — R578 Other shock: Secondary | ICD-10-CM | POA: Diagnosis not present

## 2016-05-22 DIAGNOSIS — J9601 Acute respiratory failure with hypoxia: Secondary | ICD-10-CM | POA: Diagnosis not present

## 2016-05-22 DIAGNOSIS — Q8789 Other specified congenital malformation syndromes, not elsewhere classified: Secondary | ICD-10-CM | POA: Diagnosis not present

## 2016-05-22 DIAGNOSIS — I4892 Unspecified atrial flutter: Secondary | ICD-10-CM | POA: Diagnosis present

## 2016-05-22 DIAGNOSIS — R4182 Altered mental status, unspecified: Secondary | ICD-10-CM | POA: Diagnosis present

## 2016-05-22 DIAGNOSIS — K760 Fatty (change of) liver, not elsewhere classified: Secondary | ICD-10-CM | POA: Diagnosis present

## 2016-05-22 DIAGNOSIS — E876 Hypokalemia: Secondary | ICD-10-CM | POA: Diagnosis present

## 2016-05-22 LAB — COMPREHENSIVE METABOLIC PANEL
ALK PHOS: 87 U/L (ref 38–126)
ALT: 25 U/L (ref 14–54)
AST: 36 U/L (ref 15–41)
Albumin: 2.8 g/dL — ABNORMAL LOW (ref 3.5–5.0)
Anion gap: 18 — ABNORMAL HIGH (ref 5–15)
BILIRUBIN TOTAL: 0.8 mg/dL (ref 0.3–1.2)
BUN: 49 mg/dL — AB (ref 6–20)
CALCIUM: 8.2 mg/dL — AB (ref 8.9–10.3)
CO2: 20 mmol/L — ABNORMAL LOW (ref 22–32)
CREATININE: 1.15 mg/dL — AB (ref 0.44–1.00)
Chloride: 100 mmol/L — ABNORMAL LOW (ref 101–111)
GFR calc Af Amer: 57 mL/min — ABNORMAL LOW (ref >60)
GFR, EST NON AFRICAN AMERICAN: 49 mL/min — AB (ref >60)
GLUCOSE: 152 mg/dL — AB (ref 65–99)
Potassium: 2.5 mmol/L — CL (ref 3.5–5.1)
Sodium: 138 mmol/L (ref 135–145)
TOTAL PROTEIN: 5.5 g/dL — AB (ref 6.5–8.1)

## 2016-05-22 LAB — URINALYSIS, ROUTINE W REFLEX MICROSCOPIC
Bilirubin Urine: NEGATIVE
Glucose, UA: NEGATIVE mg/dL
HGB URINE DIPSTICK: NEGATIVE
KETONES UR: NEGATIVE mg/dL
LEUKOCYTES UA: NEGATIVE
Nitrite: NEGATIVE
PROTEIN: NEGATIVE mg/dL
Specific Gravity, Urine: 1.013 (ref 1.005–1.030)
pH: 5 (ref 5.0–8.0)

## 2016-05-22 LAB — GLUCOSE, CAPILLARY
GLUCOSE-CAPILLARY: 141 mg/dL — AB (ref 65–99)
Glucose-Capillary: 144 mg/dL — ABNORMAL HIGH (ref 65–99)

## 2016-05-22 LAB — BLOOD GAS, ARTERIAL
Acid-base deficit: 3.4 mmol/L — ABNORMAL HIGH (ref 0.0–2.0)
Bicarbonate: 19.8 mmol/L — ABNORMAL LOW (ref 20.0–28.0)
DRAWN BY: 244851
FIO2: 30
LHR: 16 {breaths}/min
MECHVT: 450 mL
O2 Saturation: 98.8 %
PATIENT TEMPERATURE: 98.5
PCO2 ART: 27.9 mmHg — AB (ref 32.0–48.0)
PEEP: 5 cmH2O
pH, Arterial: 7.464 — ABNORMAL HIGH (ref 7.350–7.450)
pO2, Arterial: 142 mmHg — ABNORMAL HIGH (ref 83.0–108.0)

## 2016-05-22 LAB — CBC
HEMATOCRIT: 16.7 % — AB (ref 36.0–46.0)
Hemoglobin: 6 g/dL — CL (ref 12.0–15.0)
MCH: 29.6 pg (ref 26.0–34.0)
MCHC: 35.9 g/dL (ref 30.0–36.0)
MCV: 82.3 fL (ref 78.0–100.0)
PLATELETS: 336 10*3/uL (ref 150–400)
RBC: 2.03 MIL/uL — AB (ref 3.87–5.11)
RDW: 14.1 % (ref 11.5–15.5)
WBC: 16.2 10*3/uL — ABNORMAL HIGH (ref 4.0–10.5)

## 2016-05-22 LAB — TRIGLYCERIDES: TRIGLYCERIDES: 440 mg/dL — AB (ref <150)

## 2016-05-22 LAB — I-STAT ARTERIAL BLOOD GAS, ED
ACID-BASE EXCESS: 3 mmol/L — AB (ref 0.0–2.0)
Bicarbonate: 25.4 mmol/L (ref 20.0–28.0)
O2 SAT: 100 %
PH ART: 7.535 — AB (ref 7.350–7.450)
PO2 ART: 504 mmHg — AB (ref 83.0–108.0)
Patient temperature: 97.4
TCO2: 26 mmol/L (ref 0–100)
pCO2 arterial: 29.9 mmHg — ABNORMAL LOW (ref 32.0–48.0)

## 2016-05-22 LAB — HEMOGLOBIN AND HEMATOCRIT, BLOOD
HCT: 29 % — ABNORMAL LOW (ref 36.0–46.0)
Hemoglobin: 10.2 g/dL — ABNORMAL LOW (ref 12.0–15.0)

## 2016-05-22 LAB — PROTIME-INR
INR: 1.45
Prothrombin Time: 17.7 seconds — ABNORMAL HIGH (ref 11.4–15.2)

## 2016-05-22 LAB — PREPARE RBC (CROSSMATCH)

## 2016-05-22 LAB — POC OCCULT BLOOD, ED: FECAL OCCULT BLD: POSITIVE — AB

## 2016-05-22 LAB — CK
CK TOTAL: 56 U/L (ref 38–234)
CK TOTAL: 183 U/L (ref 38–234)

## 2016-05-22 LAB — PHOSPHORUS: Phosphorus: 3.6 mg/dL (ref 2.5–4.6)

## 2016-05-22 LAB — AMMONIA: Ammonia: 31 umol/L (ref 9–35)

## 2016-05-22 LAB — MRSA PCR SCREENING: MRSA by PCR: NEGATIVE

## 2016-05-22 LAB — ABO/RH: ABO/RH(D): B POS

## 2016-05-22 LAB — MAGNESIUM: Magnesium: 1.5 mg/dL — ABNORMAL LOW (ref 1.7–2.4)

## 2016-05-22 MED ORDER — ONDANSETRON HCL 4 MG/2ML IJ SOLN: 4.0000 mg | Freq: Four times a day (QID) | INTRAMUSCULAR | Status: DC | PRN

## 2016-05-22 MED ORDER — MIDAZOLAM HCL 2 MG/2ML IJ SOLN
2.0000 mg | INTRAMUSCULAR | Status: DC | PRN
  Administered 2016-05-22 – 2016-05-23 (×3): 2 mg via INTRAVENOUS
  Filled 2016-05-22 (×3): qty 2

## 2016-05-22 MED ORDER — INSULIN ASPART 100 UNIT/ML ~~LOC~~ SOLN
0.0000 [IU] | SUBCUTANEOUS | Status: DC
  Administered 2016-05-22 – 2016-05-23 (×4): 2 [IU] via SUBCUTANEOUS
  Administered 2016-05-23: 3 [IU] via SUBCUTANEOUS
  Administered 2016-05-24: 2 [IU] via SUBCUTANEOUS

## 2016-05-22 MED ORDER — SODIUM CHLORIDE 0.9 % IV SOLN
8.0000 mg/h | INTRAVENOUS | Status: AC
  Administered 2016-05-22 – 2016-05-25 (×6): 8 mg/h via INTRAVENOUS
  Filled 2016-05-22 (×14): qty 80

## 2016-05-22 MED ORDER — PANTOPRAZOLE SODIUM 40 MG IV SOLR
80.0000 mg | Freq: Once | INTRAVENOUS | Status: AC
  Administered 2016-05-22: 16:00:00 80 mg via INTRAVENOUS
  Filled 2016-05-22: qty 80

## 2016-05-22 MED ORDER — FENTANYL CITRATE (PF) 100 MCG/2ML IJ SOLN
INTRAMUSCULAR | Status: DC | PRN
  Administered 2016-05-22: 100 ug via INTRAVENOUS

## 2016-05-22 MED ORDER — SODIUM CHLORIDE 0.9 % IV SOLN
30.0000 meq | Freq: Once | INTRAVENOUS | Status: AC
  Administered 2016-05-22: 30 meq via INTRAVENOUS
  Filled 2016-05-22: qty 15

## 2016-05-22 MED ORDER — SODIUM CHLORIDE 0.9 % IV SOLN
50.0000 ug/h | INTRAVENOUS | Status: DC
  Administered 2016-05-22 – 2016-05-23 (×2): 50 ug/h via INTRAVENOUS
  Filled 2016-05-22 (×4): qty 1

## 2016-05-22 MED ORDER — PANTOPRAZOLE SODIUM 40 MG IV SOLR: 40.0000 mg | Freq: Once | INTRAVENOUS | Status: DC

## 2016-05-22 MED ORDER — PROPOFOL 1000 MG/100ML IV EMUL
5.0000 ug/kg/min | INTRAVENOUS | Status: DC
  Administered 2016-05-22: 80 ug/kg/min via INTRAVENOUS
  Administered 2016-05-22 – 2016-05-23 (×2): 50 ug/kg/min via INTRAVENOUS
  Administered 2016-05-23: 80 ug/kg/min via INTRAVENOUS
  Filled 2016-05-22 (×4): qty 100

## 2016-05-22 MED ORDER — OCTREOTIDE LOAD VIA INFUSION
50.0000 ug | Freq: Once | INTRAVENOUS | Status: AC
  Administered 2016-05-22: 50 ug via INTRAVENOUS
  Filled 2016-05-22: qty 25

## 2016-05-22 MED ORDER — SUCCINYLCHOLINE CHLORIDE 20 MG/ML IJ SOLN
INTRAMUSCULAR | Status: DC | PRN
  Administered 2016-05-22: 10 mg via INTRAVENOUS

## 2016-05-22 MED ORDER — ETOMIDATE 2 MG/ML IV SOLN
INTRAVENOUS | Status: DC | PRN
  Administered 2016-05-22: 10 mg via INTRAVENOUS

## 2016-05-22 MED ORDER — FOLIC ACID 5 MG/ML IJ SOLN
1.0000 mg | Freq: Every day | INTRAMUSCULAR | Status: DC
  Administered 2016-05-23 – 2016-05-26 (×4): 1 mg via INTRAVENOUS
  Filled 2016-05-22 (×5): qty 0.2

## 2016-05-22 MED ORDER — SODIUM CHLORIDE 0.9 % IV SOLN
INTRAVENOUS | Status: DC
  Administered 2016-05-22 – 2016-05-23 (×2): via INTRAVENOUS

## 2016-05-22 MED ORDER — MAGNESIUM SULFATE 2 GM/50ML IV SOLN
2.0000 g | Freq: Once | INTRAVENOUS | Status: AC
  Administered 2016-05-22: 2 g via INTRAVENOUS
  Filled 2016-05-22: qty 50

## 2016-05-22 MED ORDER — SODIUM CHLORIDE 0.9 % IV SOLN: 80.0000 mg | Freq: Once | INTRAVENOUS | Status: DC

## 2016-05-22 MED ORDER — SODIUM CHLORIDE 0.9 % IV SOLN
10.0000 mL/h | Freq: Once | INTRAVENOUS | Status: AC
  Administered 2016-05-22: 10 mL/h via INTRAVENOUS

## 2016-05-22 MED ORDER — ALBUTEROL SULFATE (2.5 MG/3ML) 0.083% IN NEBU: 2.5000 mg | INHALATION_SOLUTION | RESPIRATORY_TRACT | Status: DC | PRN

## 2016-05-22 MED ORDER — PROPOFOL 1000 MG/100ML IV EMUL
INTRAVENOUS | Status: DC | PRN
  Administered 2016-05-22: 10 ug/kg/min via INTRAVENOUS

## 2016-05-22 MED ORDER — PROPOFOL 1000 MG/100ML IV EMUL
INTRAVENOUS | Status: AC
  Filled 2016-05-22: qty 100

## 2016-05-22 MED ORDER — MIDAZOLAM HCL 2 MG/2ML IJ SOLN
INTRAMUSCULAR | Status: AC
  Filled 2016-05-22: qty 4

## 2016-05-22 MED ORDER — THIAMINE HCL 100 MG/ML IJ SOLN
100.0000 mg | Freq: Every day | INTRAMUSCULAR | Status: DC
  Administered 2016-05-22 – 2016-05-26 (×5): 100 mg via INTRAVENOUS
  Filled 2016-05-22 (×5): qty 2

## 2016-05-22 MED ORDER — ONDANSETRON HCL 4 MG/2ML IJ SOLN
4.0000 mg | Freq: Once | INTRAMUSCULAR | Status: AC
  Administered 2016-05-22: 4 mg via INTRAVENOUS
  Filled 2016-05-22: qty 2

## 2016-05-22 MED ORDER — PANTOPRAZOLE SODIUM 40 MG IV SOLR: 40.0000 mg | Freq: Two times a day (BID) | INTRAVENOUS | Status: DC

## 2016-05-22 MED ORDER — MIDAZOLAM HCL 5 MG/5ML IJ SOLN
INTRAMUSCULAR | Status: DC | PRN
  Administered 2016-05-22: 50 mg via INTRAVENOUS

## 2016-05-22 MED ORDER — DEXTROSE 5 % IV SOLN
1.0000 g | INTRAVENOUS | Status: DC
  Administered 2016-05-22: 1 g via INTRAVENOUS
  Filled 2016-05-22 (×2): qty 10

## 2016-05-22 MED ORDER — FENTANYL CITRATE (PF) 100 MCG/2ML IJ SOLN
INTRAMUSCULAR | Status: AC
  Filled 2016-05-22: qty 2

## 2016-05-22 MED ORDER — CHLORHEXIDINE GLUCONATE 0.12% ORAL RINSE (MEDLINE KIT)
15.0000 mL | Freq: Two times a day (BID) | OROMUCOSAL | Status: DC
  Administered 2016-05-22 – 2016-05-24 (×3): 15 mL via OROMUCOSAL

## 2016-05-22 MED ORDER — FENTANYL CITRATE (PF) 100 MCG/2ML IJ SOLN
100.0000 ug | INTRAMUSCULAR | Status: DC | PRN
  Administered 2016-05-22 – 2016-05-23 (×3): 100 ug via INTRAVENOUS
  Filled 2016-05-22 (×4): qty 2

## 2016-05-22 MED ORDER — ORAL CARE MOUTH RINSE
15.0000 mL | Freq: Four times a day (QID) | OROMUCOSAL | Status: DC
  Administered 2016-05-23 – 2016-05-24 (×5): 15 mL via OROMUCOSAL

## 2016-05-22 NOTE — ED Notes (Signed)
$'20mg'Y$  bolus propofol given
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
## 2016-05-22 NOTE — H&P (Signed)
PCCM History and Physical Note  Admission date: 05/22/2016 Referring provider: Dr. Jeanell Sparrow  CC: Bleeding  HPI: Pt just intubated, sedated.  Hx from medical record and ER staff.  66 yo female fell at home.  Was found by roommate on floor.  She was throwing up blood.  She has hx of ETOH and last drink reported 5 days prior to admission,  In ER she was seen by GI.  Noted to take 2 aleve daily.  She continued to have bleeding, and there was concern about her ability to protect airway.  As a results she was intubated and had central venous access by EDP.  She  has a past medical history of Alcohol use; Arthritis; Blindness; Breast cancer, left breast ([place]); Cancer ([place]); Diabetes mellitus; and Hypertension.  She  has no past surgical history on file.  Unable to obtain family history   She  reports that she has been smoking.  She has never used smokeless tobacco. She reports that she drinks alcohol. She reports that she does not use drugs.  No Known Allergies  No current facility-administered medications on file prior to encounter.    Current Outpatient Prescriptions on File Prior to Encounter  Medication Sig  . amLODipine (NORVASC) 10 MG tablet Take 10 mg by mouth daily.  Marland Kitchen aspirin EC 81 MG tablet Take 81 mg by mouth daily.  . hydrochlorothiazide (HYDRODIURIL) 25 MG tablet Take 25 mg by mouth daily.  Marland Kitchen ibuprofen (ADVIL,MOTRIN) 200 MG tablet Take 600 mg by mouth every 6 (six) hours as needed for pain.  Marland Kitchen lovastatin (MEVACOR) 20 MG tablet Take 20 mg by mouth at bedtime.  . naproxen (NAPROSYN) 500 MG tablet Take 500 mg by mouth 2 (two) times daily with a meal.  . ondansetron (ZOFRAN) 4 MG tablet Take 4 mg by mouth every 8 (eight) hours as needed for nausea or vomiting.  Marland Kitchen OVER THE COUNTER MEDICATION Take 3 tablets by mouth at bedtime. Over the Counter sleep aid -- patient takes 3 tablets every night  . oxyCODONE-acetaminophen (PERCOCET) 10-325 MG per tablet Take 1-2 tablets by mouth every 6 (six)  hours as needed for pain.  . promethazine (PHENERGAN) 25 MG tablet Take 1 tablet (25 mg total) by mouth every 6 (six) hours as needed for nausea. (Patient taking differently: Take 12.5-25 mg by mouth every 6 (six) hours as needed for nausea. )  . venlafaxine XR (EFFEXOR-XR) 75 MG 24 hr capsule Take 75 mg by mouth daily with breakfast.   ROS: Unable to obtain  Vital signs: BP 178/90   Pulse 104   Temp 97.5 F (36.4 C) (Oral)   Resp 20   Ht '5\' 6"'$  (1.676 m)   Wt 148 lb (67.1 kg)   SpO2 100%   BMI 23.89 kg/m   Intake/output: No intake/output data recorded.  General: seen in ER Neuro: sedated, paralyzed after intubation HEENT: ETT, NG tubes in place Cardiac: irregular, no murmur Chest: no wheeze, Lt breast surgical removed Abd: soft, decreased BS, non tender Ext: no edema, decreased muscle bulk Skin: no rashes   CMP Latest Ref Rng & Units 02/20/2013 05/23/2012 02/14/2011  Glucose 70 - 99 mg/dL 126(H) 115(H) 97  BUN 6 - 23 mg/dL '7 8 8  '$ Creatinine 0.50 - 1.10 mg/dL 0.73 0.83 0.67  Sodium 135 - 145 mEq/L 133(L) 132(L) 138  Potassium 3.5 - 5.1 mEq/L 4.6 4.2 4.0  Chloride 96 - 112 mEq/L 97 95(L) 105  CO2 19 - 32 mEq/L 23 22  20  Calcium 8.4 - 10.5 mg/dL 9.6 10.0 9.3  Total Protein 6.0 - 8.3 g/dL 7.9 8.0 6.9  Total Bilirubin 0.3 - 1.2 mg/dL 0.8 0.8 0.2(L)  Alkaline Phos 39 - 117 U/L 79 84 71  AST 0 - 37 U/L '26 22 22  '$ ALT 0 - 35 U/L 24 19 33     CBC Latest Ref Rng & Units 05/22/2016 02/20/2013 05/23/2012  WBC 4.0 - 10.5 K/uL 16.2(H) 12.3(H) 14.1(H)  Hemoglobin 12.0 - 15.0 g/dL 6.0(LL) 15.4(H) 16.5(H)  Hematocrit 36.0 - 46.0 % 16.7(L) 41.8 45.0  Platelets 150 - 400 K/uL 336 404(H) 403(H)     ABG No results found for: PHART, PCO2ART, PO2ART, HCO3, TCO2, ACIDBASEDEF, O2SAT   CBG (last 3)  No results for input(s): GLUCAP in the last 72 hours.   Imaging: Dg Abd Portable 1 View  Result Date: 05/22/2016 CLINICAL DATA:  NG tube placement EXAM: PORTABLE ABDOMEN - 1 VIEW  COMPARISON:  None. FINDINGS: 1600 hours. NG tube tip is in the region the pylorus in may be transpyloric into the proximal duodenum. Bowel gas pattern nonspecific. Telemetry leads overlie the chest. IMPRESSION: NG tube tip is at or potentially distal to the pylorus. Tube could be retracted about 5 cm for tip placement in the distal stomach. Electronically Signed   By: Misty Stanley M.D.   On: 05/22/2016 16:14     Studies:  Antibiotics: Rocephin 2/09 >>  Cultures: Blood 2/09 >>  Lines/tubes: ETT 2/09 >> CVL 2/09 >>  Events: 2/09 Admit, GI consulted  Summary: 66 yo female smoker  with hx of ETOH and NSAID use presents with upper GI bleeding.  Required intubation for airway protection.  Assessment/plan:  Upper GI bleeding. - octreotide, protonix gtt - rocephin per GI - GI assessing for endoscopy  Acute blood loss anemia. - f/u CBC after transfusion - transfuse for Hb < 7, hemodynamic compromise, or bleeding - f/u INR  Compromised airway. - full vent support - f/u CXR, ABG - prn BDs  Acute metabolic encephalopathy. Hx of ETOH. - RASS goal -1 to -2 - thiamine, folic acid  A flutter. Hx of HTN. - monitor heart rhythm  DM type II. - SSI  DVT prophylaxis - SCDs SUP - protonix gtt Nutrition - NPO Goals of care - full code  No family at time of my examination  CC time 38 minutes  Chesley Mires, MD [place] 05/22/2016, 4:57 PM Pager:  [phone] After 3pm call: [phone]

## 2016-05-22 NOTE — Code Documentation (Signed)
$'20mg'v$  bolus of propofol for sedation

## 2016-05-22 NOTE — ED Notes (Signed)
Pt. Verbally agreeing to blood transfusion at this time with 2x RN as witnesses.

## 2016-05-22 NOTE — Progress Notes (Signed)
Patient transported from ED to room 3T59 without complications.

## 2016-05-22 NOTE — Code Documentation (Signed)
Intubation complete and successful.

## 2016-05-22 NOTE — Consult Note (Signed)
[facility] Consult: 4:29 PM 05/22/2016  LOS: 0 days    Referring Provider: ED PA Marcene Brawn.    Primary Care Physician:  Dr Vista Lawman. Primary Gastroenterologist:  unassigned     Reason for Consultation:  GI bleed and anemia.     HPI: Emily Cohen is a 66 y.o. female.  PMH alcoholism.  Pancreatitis in 03/2013, probably alcoholic. Left breast cancer and mastectomy before 2002.  Blindness.  Htn.   She has had previous ultrasound as well as CT scan. The CT scan from 05/2012 showed fatty liver and focal area of low density in the pancreatic head possibly representing focal pancreatitis or pancreatic carcinoma. Further workup with MRI post resolution of her acute symptoms was suggested.  However it doesn't look like she had any further imaging after 2014.  No previous upper endoscopy and colonoscopy.    Nausea and vomiting of bloody and sometimes dark emesis began yesterday. It was bloody from the get go. She also was passing red blood and dark, tarry, watery stools. She doesn't have abdominal pain. It's unusual for her to have GI upset and she rarely vomits. Patient takes 2 Aleve daily as she has done for at least a year.   She drinks malt liquor up to 40 ounces daily. 5 days ago she stopped drinking cold [place] because she is fed up with her drinking.  Patient found at home today down on the floor.  She fell on the floor and does not recall losing consciousness. She was not confused but she couldn't get herself up because she was too weak.Marland Kitchen She was covered with blood and vomited blood as well as passing dark stools and passing blood per rectum. Multiple labs are pending but so far we know that her hemoglobin is 6, her MCV is 82. She has good platelet count at 336 K.  FOBT positive.  Chemistries, coags, ammonia  level are all pending. Blood pressures reached as high as 203 over 96 and is currently in the 1 thirties over eighties. Tachycardia from 111-127.  Hypothermic with temperature 95.2 on arrival but 90 7.5 on recheck.     Past Medical History:  Diagnosis Date  . Alcohol use   . Arthritis   . Blindness   . Breast cancer, left breast ([place])   . Cancer ([place])   . Diabetes mellitus   . Hypertension     History reviewed. No pertinent surgical history.  Prior to Admission medications   Medication Sig Start Date End Date Taking? Authorizing Provider  amLODipine (NORVASC) 10 MG tablet Take 10 mg by mouth daily.    Historical Provider, MD  aspirin EC 81 MG tablet Take 81 mg by mouth daily.    Historical Provider, MD  hydrochlorothiazide (HYDRODIURIL) 25 MG tablet Take 25 mg by mouth daily.    Historical Provider, MD  ibuprofen (ADVIL,MOTRIN) 200 MG tablet Take 600 mg by mouth every 6 (six) hours as needed for pain.    Historical Provider, MD  lovastatin (MEVACOR) 20 MG tablet Take 20 mg by mouth at bedtime.  Historical Provider, MD  naproxen (NAPROSYN) 500 MG tablet Take 500 mg by mouth 2 (two) times daily with a meal.    Historical Provider, MD  ondansetron (ZOFRAN) 4 MG tablet Take 4 mg by mouth every 8 (eight) hours as needed for nausea or vomiting.    Historical Provider, MD  OVER THE COUNTER MEDICATION Take 3 tablets by mouth at bedtime. Over the Counter sleep aid -- patient takes 3 tablets every night    Historical Provider, MD  oxyCODONE-acetaminophen (PERCOCET) 10-325 MG per tablet Take 1-2 tablets by mouth every 6 (six) hours as needed for pain.    Historical Provider, MD  promethazine (PHENERGAN) 25 MG tablet Take 1 tablet (25 mg total) by mouth every 6 (six) hours as needed for nausea. Patient taking differently: Take 12.5-25 mg by mouth every 6 (six) hours as needed for nausea.  05/23/12   Davonna Belling, MD  venlafaxine XR (EFFEXOR-XR) 75 MG 24 hr capsule Take 75 mg by mouth daily  with breakfast.    Historical Provider, MD    Scheduled Meds: . octreotide  50 mcg Intravenous Once  . [START ON 05/26/2016] pantoprazole  40 mg Intravenous Q12H   Infusions: . cefTRIAXone (ROCEPHIN)  IV    . octreotide  (SANDOSTATIN)    IV infusion    . pantoprozole (PROTONIX) infusion     PRN Meds:    Allergies as of 05/22/2016  . (No Known Allergies)    History reviewed. No pertinent family history.  Social History   Social History  . Marital status: Single    Spouse name: N/A  . Number of children: N/A  . Years of education: N/A   Occupational History  . Not on file.   Social History Main Topics  . Smoking status: Current Every Day Smoker  . Smokeless tobacco: Never Used  . Alcohol use Yes     Comment: heavy drinker  . Drug use: No  . Sexual activity: Not on file   Other Topics Concern  . Not on file   Social History Narrative  . No narrative on file    REVIEW OF SYSTEMS: Constitutional:  Weakness as per HPI ENT:  No nose bleeds Pulm:  No SOB or cough.   CV:  No palpitations, no LE edema.  GU:  No hematuria, no frequency GI:  Per HPI.  No dysphagia.  Occasional heartburn. No previous history of hematemesis or of passing bloody stool. Heme:  No unusual bleeding or bruising.   Transfusions:  No previous transfusions. Neuro:  No recent or past history of seizures. She feels a bit tremulous today. Derm:  She gets pruritic lesions on her upper Endocrine:  No sweats or chills.  No polyuria or dysuria Immunization:  Didn't inquire as to recent or past immunizations. Travel:  None beyond local counties in last few months.    PHYSICAL EXAM: Vital signs in last 24 hours: Vitals:   05/22/16 1545 05/22/16 1545  BP: 130/89 130/89  Pulse: 112 112  Resp:  18  Temp:  97.5 F (36.4 C)   Wt Readings from Last 3 Encounters:  05/22/16 67.1 kg (148 lb)  02/20/13 62.7 kg (138 lb 4.8 oz)    General: Alert but acutely ill appearing AAF who has an emesis bag  at her mouth. Head:  No signs of head trauma. There is some dried blood in her hair.  Eyes:  No scleral icterus, no conjunctival pallor. Ears:  Not hard of hearing.  Nose:  NGT  is in place and is draining dark Mouth:  Oropharynx is moist and clear. Dentition is poor. Tongue is midline. Neck:  No masses, no thyromegaly, no JVD. Lungs:  Clear bilaterally.  Left mastectomy scar. Heart: RRR, tachy.   Abdomen:  Soft, NT, ND. Active bowel sounds.  No HSM, hernias or masses.  Rectal: Deferred rectal exam   Musc/Skeltl: No joint swelling, erythema or gross deformity. Extremities:  No CCE.  Neurologic:  Alert. Oriented times 3. No tremor, no asterixis. Mentation is a little bit slow but very precise and she is easy to understand. Skin:  On her upper back there are multiple scars which are hyper pigmented/dark and about 1 mm in size Tattoos:  None seen Nodes:  No cervical adenopathy.   Psych:  Calm, cooperative, seems sad and depressed.  Intake/Output from previous day: No intake/output data recorded. Intake/Output this shift: Total I/O In: 435 [Blood:335; IV Piggyback:100] Out: -   LAB RESULTS:  Recent Labs  05/22/16 1421  WBC 16.2*  HGB 6.0*  HCT 16.7*  PLT 336   BMET Lab Results  Component Value Date   NA 133 (L) 02/20/2013   NA 132 (L) 05/23/2012   NA 138 02/14/2011   K 4.6 02/20/2013   K 4.2 05/23/2012   K 4.0 02/14/2011   CL 97 02/20/2013   CL 95 (L) 05/23/2012   CL 105 02/14/2011   CO2 23 02/20/2013   CO2 22 05/23/2012   CO2 20 02/14/2011   GLUCOSE 126 (H) 02/20/2013   GLUCOSE 115 (H) 05/23/2012   GLUCOSE 97 02/14/2011   BUN 7 02/20/2013   BUN 8 05/23/2012   BUN 8 02/14/2011   CREATININE 0.73 02/20/2013   CREATININE 0.83 05/23/2012   CREATININE 0.67 02/14/2011   CALCIUM 9.6 02/20/2013   CALCIUM 10.0 05/23/2012   CALCIUM 9.3 02/14/2011   LFT No results for input(s): PROT, ALBUMIN, AST, ALT, ALKPHOS, BILITOT, BILIDIR, IBILI in the last 72 hours. PT/INR No  results found for: INR, PROTIME Hepatitis Panel No results for input(s): HEPBSAG, HCVAB, HEPAIGM, HEPBIGM in the last 72 hours. C-Diff No components found for: CDIFF Lipase     Component Value Date/Time   LIPASE 111 (H) 02/20/2013 1602    Drugs of Abuse     Component Value Date/Time   LABOPIA NONE DETECTED 02/14/2011 1748   COCAINSCRNUR NONE DETECTED 02/14/2011 1748   LABBENZ NONE DETECTED 02/14/2011 1748   AMPHETMU NONE DETECTED 02/14/2011 1748   THCU NONE DETECTED 02/14/2011 1748   LABBARB NONE DETECTED 02/14/2011 1748     RADIOLOGY STUDIES: Dg Abd Portable 1 View  Result Date: 05/22/2016 CLINICAL DATA:  NG tube placement EXAM: PORTABLE ABDOMEN - 1 VIEW COMPARISON:  None. FINDINGS: 1600 hours. NG tube tip is in the region the pylorus in may be transpyloric into the proximal duodenum. Bowel gas pattern nonspecific. Telemetry leads overlie the chest. IMPRESSION: NG tube tip is at or potentially distal to the pylorus. Tube could be retracted about 5 cm for tip placement in the distal stomach. Electronically Signed   By: Misty Stanley M.D.   On: 05/22/2016 16:14      IMPRESSION:   *  GI bleed with hematemesis and reports of both dark stools and hematochezia.  *  Atrial flutter.  *  Alcohol abuse.    *  Altered mental status, found down at home.    PLAN:     *  Octreotide and Protonix drips have been initiated by ED staff. Added daily  Rocephin indicated for GI bleeding in someone who may possibly have cirrhosis.  *  Plan upper endoscopy tomorrow once she has been stabilized though Dr. Ardis Hughs is available if this needs to be done emergently tonight.   *  Await coags, LFTs, ammonia level.   *  If she has coagulopathy this will need to be corrected with vitamin K and possibly FFP.  *  Red blood transfusion has already been started.  A total of 2 units are ordered..  *  Obviously she is not a candidate for anticoagulation despite the atrial flutter.   Azucena Freed   05/22/2016, 4:29 PM Pager: [phone]  ________________________________________________________________________  [facility] MD note:  I personally examined the patient, reviewed the data and agree with the assessment and plan described above.  Acute UGI bleeding in alcoholic with daily NSAID use.  She needs stabilization, blood transfusion, admission to ICU, IV PPI drip and octreotide drip as well.  Await coags, if elevated she should get vit K as well as FFP.  I'm planning on EGD tomorrow AM, hoping to get some control, stablization with the above measures first.  If needed more emergently, I am available anytime this weekend.  Owens Loffler, MD [facility] Pager [phone]

## 2016-05-22 NOTE — Procedures (Signed)
Intubation Procedure Note Emily Cohen [id number] 06-11-1950  Procedure: Intubation Indications: Airway protection and maintenance  Procedure Details Consent: Unable to obtain consent because of altered level of consciousness. Time Out: Verified patient identification, verified procedure, site/side was marked, verified correct patient position, special equipment/implants available, medications/allergies/relevent history reviewed, required imaging and test results available.  Performed  Maximum sterile technique was used including antiseptics, cap, gloves, gown, hand hygiene, mask and sheet.  MAC and 4    Evaluation Hemodynamic Status: BP stable throughout; O2 sats: stable throughout Patient's Current Condition: stable Complications: No apparent complications Patient did tolerate procedure well. Chest X-ray ordered to verify placement.  CXR: pending   Patient intubated by ED MD without complications.  Positive color change noted.  Condensation noted in ETT.  Bilateral breath sounds heard.  Chest xray pending.   Philomena Doheny 05/22/2016

## 2016-05-22 NOTE — Progress Notes (Signed)
[facility] Progress Note Patient Name: Emily Cohen DOB: Sep 18, 1950 MRN: [id number]   Date of Service  05/22/2016  HPI/Events of Note  Patient known from ER. Transferred to ICU.  89 female, known alcoholic, presenting with upper GI bleed likely from bleeding PUD. Intubated for airway protection.   Actively bleeding in the ER. On transfer to the ICU, no further bleeding noted. Baseline hemoglobin was 6. Received 1 unit packed red blood cells. Awaiting for the second unit.   Patient seen, comfortable, sedated. Blood pressure was 124/60, pulse rate 105, respiratory 20, sats 100%.   Chest x-ray without significant infiltrates. TLC in good place.   ABG with respiratory alkalosis.   eICU Interventions  Will adjust the vent. Rpt abg after 30 mins.   May use TLC.  Transfuse blood pRBC as ordered.   Awaiting rpt Hb and hct after transfusion.   Cont present management     Intervention Category Evaluation Type: New Patient Evaluation  Rush Landmark 05/22/2016, 7:20 PM

## 2016-05-22 NOTE — ED Notes (Signed)
EDP at bedside  

## 2016-05-22 NOTE — ED Provider Notes (Signed)
[facility] DEPT Provider Note   CSN: [id number] Arrival date & time: 05/22/16  1401     History   Chief Complaint Chief Complaint  Patient presents with  . Near Syncope  . GI Bleeding    HPI Emily Cohen is a 66 y.o. female.  The history is provided by the patient. No language interpreter was used.  Near Syncope  This is a new problem. The problem occurs constantly. The problem has not changed since onset.Nothing aggravates the symptoms. Nothing relieves the symptoms. She has tried nothing for the symptoms.  Pt was found on the floor by a roommate.  Pt reports she has been vomiting blood and having bloody diarrhea.  EMS reports she began feeling sick several days ago. Pt reports she has had black stools  Past Medical History:  Diagnosis Date  . Alcohol use   . Arthritis   . Blindness   . Breast cancer, left breast ([place])   . Cancer ([place])   . Diabetes mellitus   . Hypertension     There are no active problems to display for this patient.   History reviewed. No pertinent surgical history.  OB History    No data available       Home Medications    Prior to Admission medications   Medication Sig Start Date End Date Taking? Authorizing Provider  amLODipine (NORVASC) 10 MG tablet Take 10 mg by mouth daily.    Historical Provider, MD  aspirin EC 81 MG tablet Take 81 mg by mouth daily.    Historical Provider, MD  hydrochlorothiazide (HYDRODIURIL) 25 MG tablet Take 25 mg by mouth daily.    Historical Provider, MD  ibuprofen (ADVIL,MOTRIN) 200 MG tablet Take 600 mg by mouth every 6 (six) hours as needed for pain.    Historical Provider, MD  lovastatin (MEVACOR) 20 MG tablet Take 20 mg by mouth at bedtime.    Historical Provider, MD  naproxen (NAPROSYN) 500 MG tablet Take 500 mg by mouth 2 (two) times daily with a meal.    Historical Provider, MD  ondansetron (ZOFRAN) 4 MG tablet Take 4 mg by mouth every 8 (eight) hours as needed for nausea or vomiting.     Historical Provider, MD  OVER THE COUNTER MEDICATION Take 3 tablets by mouth at bedtime. Over the Counter sleep aid -- patient takes 3 tablets every night    Historical Provider, MD  oxyCODONE-acetaminophen (PERCOCET) 10-325 MG per tablet Take 1-2 tablets by mouth every 6 (six) hours as needed for pain.    Historical Provider, MD  promethazine (PHENERGAN) 25 MG tablet Take 1 tablet (25 mg total) by mouth every 6 (six) hours as needed for nausea. Patient taking differently: Take 12.5-25 mg by mouth every 6 (six) hours as needed for nausea.  05/23/12   Davonna Belling, MD  venlafaxine XR (EFFEXOR-XR) 75 MG 24 hr capsule Take 75 mg by mouth daily with breakfast.    Historical Provider, MD    Family History History reviewed. No pertinent family history.  Social History Social History  Substance Use Topics  . Smoking status: Current Every Day Smoker  . Smokeless tobacco: Never Used  . Alcohol use Yes     Comment: heavy drinker     Allergies   Patient has no known allergies.   Review of Systems Review of Systems  Cardiovascular: Positive for near-syncope.  All other systems reviewed and are negative.    Physical Exam Updated Vital Signs BP (!) 203/96  Pulse 103   Temp (!) 95.2 F (35.1 C) (Rectal)   Resp 19   Ht '5\' 6"'$  (1.676 m)   Wt 67.1 kg   SpO2 100%   BMI 23.89 kg/m   Physical Exam  Constitutional: She appears well-developed and well-nourished. No distress.  HENT:  Head: Normocephalic and atraumatic.  Nose: Nose normal.  Mouth/Throat: Oropharynx is clear and moist.  Eyes: Conjunctivae are normal.  Neck: Neck supple.  Cardiovascular: Normal rate.   No murmur heard. Tachy 120  Pulmonary/Chest: Effort normal and breath sounds normal. No respiratory distress.  Abdominal: Soft. She exhibits distension. There is tenderness.  Musculoskeletal: She exhibits no edema.  Neurological: She is alert. No cranial nerve deficit.  Skin: Skin is warm and dry.  Psychiatric: She  has a normal mood and affect.  Nursing note and vitals reviewed.    ED Treatments / Results  Labs (all labs ordered are listed, but only abnormal results are displayed) Labs Reviewed  POC OCCULT BLOOD, ED - Abnormal; Notable for the following:       Result Value   Fecal Occult Bld POSITIVE (*)    All other components within normal limits  CULTURE, BLOOD (ROUTINE X 2)  CULTURE, BLOOD (ROUTINE X 2)  COMPREHENSIVE METABOLIC PANEL  CBC  PROTIME-INR  AMMONIA  TYPE AND SCREEN    EKG  EKG Interpretation None       Radiology No results found.  Procedures Procedures (including critical care time)  Medications Ordered in ED Medications  ondansetron (ZOFRAN) injection 4 mg (not administered)  pantoprazole (PROTONIX) 80 mg in sodium chloride 0.9 % 100 mL IVPB (not administered)  pantoprazole (PROTONIX) 80 mg in sodium chloride 0.9 % 250 mL (0.32 mg/mL) infusion (not administered)  pantoprazole (PROTONIX) injection 40 mg (not administered)     Initial Impression / Assessment and Plan / ED Course  I have reviewed the triage vital signs and the nursing notes.  Pertinent labs & imaging results that were available during my care of the patient were reviewed by me and considered in my medical decision making (see chart for details).    ED course. IV x2, 02 x 2 liters, labs ordered  Hemoglobin 6.0.  Pt began actively vomiting bright red blood.  I spoke with Ivory Broad PA with [facility] who will see.   Critical care consulted.  Dr. Halford Chessman evaluated pt and will admit to ICU. Pt had increased vomiting of blood.  Pt vomiting around ng tube.  Pt required intubation to maintain airway. Central line for blood and medication.  CRITICAL CARE Performed by: Alyse Low Total critical care time: 60  minutes Critical care time was exclusive of separately billable procedures and treating other patients. Critical care was necessary to treat or prevent imminent or life-threatening  deterioration. Critical care was time spent personally by me on the following activities: development of treatment plan with patient and/or surrogate as well as nursing, discussions with consultants, evaluation of patient's response to treatment, examination of patient, obtaining history from patient or surrogate, ordering and performing treatments and interventions, ordering and review of laboratory studies, ordering and review of radiographic studies, pulse oximetry and re-evaluation of patient's condition.  Final Clinical Impressions(s) / ED Diagnoses   Final diagnoses:  Acute GI bleeding  Upper GI bleed  Lower GI bleed    New Prescriptions New Prescriptions   No medications on file     Fransico Meadow, PA-C 05/22/16 1753    Pattricia Boss, MD 05/23/16 [phone]

## 2016-05-22 NOTE — Code Documentation (Signed)
$'20mg'a$  bolus of propofol

## 2016-05-22 NOTE — Code Documentation (Signed)
ICU MD in room

## 2016-05-22 NOTE — ED Provider Notes (Signed)
66 year old female history of alcohol abuse who presents today with upper GI bleed. She has been tachycardic and her pressures have ranged from 1 121-975 SYSTOLICALLY. PATIENT HAD NG TUBE PLACED AND HAD LARGE QUANTITIES OF BLOOD RETURNED. Patient intubated and right ij placed.    INTUBATION Performed by: Shaune Pollack  Required items: required blood products, implants, devices, and special equipment available Patient identity confirmed: provided demographic data and hospital-assigned identification number Time out: Immediately prior to procedure a "time out" was called to verify the correct patient, procedure, equipment, support staff and site/side marked as required.  Indications: airway protection   Intubation method: Glidescope Laryngoscopy   Preoxygenation: high flow nasal canula, nrb  Sedatives: 10Etomidate Paralytic: 100Succinylcholine  Tube Size: 8 cuffed  Post-procedure assessment: chest rise and ETCO2 monitor Breath sounds: equal and absent over the epigastrium Tube secured with: ETT holder Chest x-Zakhai Meisinger interpreted by radiologist and me.  Chest x-Jacklynn Dehaas findings: 8 endotracheal tube in appropriate position  Patient tolerated the procedure well with no immediate complications.  CENTRAL LINE Performed by: Shaune Pollack Consent: The procedure was performed in an emergent situation. Required items: required blood products, implants, devices, and special equipment available Patient identity confirmed: arm band and provided demographic data Time out: Immediately prior to procedure a "time out" was called to verify the correct patient, procedure, equipment, support staff and site/side marked as required. Indications: vascular access Anesthesia: local infiltration Local anesthetic: lidocaine 1% with epinephrine Anesthetic total: 3 ml Patient sedated: no Preparation: skin prepped with 2% chlorhexidine Skin prep agent dried: skin prep agent completely dried prior to  procedure Sterile barriers: all five maximum sterile barriers used - cap, mask, sterile gown, sterile gloves, and large sterile sheet Hand hygiene: hand hygiene performed prior to central venous catheter insertion  Location details: right ij  Catheter type: triple lumen Catheter size: 8 Fr Pre-procedure: landmarks identified Ultrasound guidance: yes Successful placement: yes Post-procedure: line sutured and dressing applied Assessment: blood return through all parts, free fluid flow, placement verified by x-Moosa Bueche and no pneumothorax on x-Jarious Lyon Patient tolerance: Patient tolerated the procedure well with no immediate complications.     Pattricia Boss, MD 05/22/16 [phone]

## 2016-05-22 NOTE — ED Notes (Signed)
GI at bedside

## 2016-05-23 ENCOUNTER — Inpatient Hospital Stay (HOSPITAL_COMMUNITY): Payer: Medicaid Other

## 2016-05-23 ENCOUNTER — Encounter (HOSPITAL_COMMUNITY): Payer: Self-pay | Admitting: *Deleted

## 2016-05-23 ENCOUNTER — Encounter (HOSPITAL_COMMUNITY)
Admission: EM | Disposition: A | Discharge status: Home Health | Payer: Self-pay | Source: Home / Self Care | Attending: Pulmonary Disease

## 2016-05-23 DIAGNOSIS — Q8789 Other specified congenital malformation syndromes, not elsewhere classified: Secondary | ICD-10-CM

## 2016-05-23 DIAGNOSIS — R578 Other shock: Secondary | ICD-10-CM

## 2016-05-23 DIAGNOSIS — G9340 Encephalopathy, unspecified: Secondary | ICD-10-CM

## 2016-05-23 DIAGNOSIS — J9601 Acute respiratory failure with hypoxia: Secondary | ICD-10-CM

## 2016-05-23 DIAGNOSIS — D62 Acute posthemorrhagic anemia: Secondary | ICD-10-CM

## 2016-05-23 HISTORY — PX: ESOPHAGOGASTRODUODENOSCOPY: SHX5428

## 2016-05-23 LAB — CBC
HEMATOCRIT: 34.1 % — AB (ref 36.0–46.0)
HEMATOCRIT: 34.4 % — AB (ref 36.0–46.0)
HEMOGLOBIN: 12 g/dL (ref 12.0–15.0)
Hemoglobin: 12.3 g/dL (ref 12.0–15.0)
MCH: 29.6 pg (ref 26.0–34.0)
MCH: 30.2 pg (ref 26.0–34.0)
MCHC: 35.2 g/dL (ref 30.0–36.0)
MCHC: 35.8 g/dL (ref 30.0–36.0)
MCV: 84 fL (ref 78.0–100.0)
MCV: 84.5 fL (ref 78.0–100.0)
Platelets: 349 10*3/uL (ref 150–400)
Platelets: 356 10*3/uL (ref 150–400)
RBC: 4.06 MIL/uL (ref 3.87–5.11)
RBC: 4.07 MIL/uL (ref 3.87–5.11)
RDW: 14.7 % (ref 11.5–15.5)
RDW: 14.7 % (ref 11.5–15.5)
WBC: 16.8 10*3/uL — AB (ref 4.0–10.5)
WBC: 22.4 10*3/uL — ABNORMAL HIGH (ref 4.0–10.5)

## 2016-05-23 LAB — POTASSIUM: Potassium: 3.2 mmol/L — ABNORMAL LOW (ref 3.5–5.1)

## 2016-05-23 LAB — TYPE AND SCREEN
BLOOD PRODUCT EXPIRATION DATE: 201803032359
Blood Product Expiration Date: 201803032359
ISSUE DATE / TIME: 201802091535
ISSUE DATE / TIME: 201802092046
UNIT TYPE AND RH: 7300
Unit Type and Rh: 7300

## 2016-05-23 LAB — COMPREHENSIVE METABOLIC PANEL
ALBUMIN: 2.7 g/dL — AB (ref 3.5–5.0)
ALT: 23 U/L (ref 14–54)
AST: 34 U/L (ref 15–41)
Alkaline Phosphatase: 84 U/L (ref 38–126)
Anion gap: 13 (ref 5–15)
BILIRUBIN TOTAL: 1.1 mg/dL (ref 0.3–1.2)
BUN: 35 mg/dL — AB (ref 6–20)
CHLORIDE: 102 mmol/L (ref 101–111)
CO2: 23 mmol/L (ref 22–32)
CREATININE: 0.86 mg/dL (ref 0.44–1.00)
Calcium: 8.2 mg/dL — ABNORMAL LOW (ref 8.9–10.3)
GFR calc Af Amer: >60 mL/min (ref >60)
GLUCOSE: 146 mg/dL — AB (ref 65–99)
Potassium: 2.8 mmol/L — ABNORMAL LOW (ref 3.5–5.1)
Sodium: 138 mmol/L (ref 135–145)
TOTAL PROTEIN: 5.3 g/dL — AB (ref 6.5–8.1)

## 2016-05-23 LAB — GLUCOSE, CAPILLARY
GLUCOSE-CAPILLARY: 145 mg/dL — AB (ref 65–99)
GLUCOSE-CAPILLARY: 124 mg/dL — AB (ref 65–99)
Glucose-Capillary: 183 mg/dL — ABNORMAL HIGH (ref 65–99)
Glucose-Capillary: 103 mg/dL — ABNORMAL HIGH (ref 65–99)

## 2016-05-23 LAB — PHOSPHORUS: Phosphorus: 2.6 mg/dL (ref 2.5–4.6)

## 2016-05-23 LAB — MAGNESIUM: MAGNESIUM: 2.4 mg/dL (ref 1.7–2.4)

## 2016-05-23 SURGERY — EGD (ESOPHAGOGASTRODUODENOSCOPY)
Anesthesia: Moderate Sedation

## 2016-05-23 MED ORDER — DEXTROSE 5 % IV SOLN
2.0000 g | INTRAVENOUS | Status: DC
  Administered 2016-05-23 – 2016-05-25 (×3): 2 g via INTRAVENOUS
  Filled 2016-05-23 (×4): qty 2

## 2016-05-23 MED ORDER — CHLORHEXIDINE GLUCONATE CLOTH 2 % EX PADS
6.0000 | MEDICATED_PAD | Freq: Every day | CUTANEOUS | Status: DC
  Administered 2016-05-22 – 2016-05-26 (×4): 6 via TOPICAL

## 2016-05-23 MED ORDER — SODIUM CHLORIDE 0.9 % IV SOLN
30.0000 meq | Freq: Two times a day (BID) | INTRAVENOUS | Status: DC
  Administered 2016-05-23: 30 meq via INTRAVENOUS
  Filled 2016-05-23 (×2): qty 15

## 2016-05-23 MED ORDER — FENTANYL CITRATE (PF) 100 MCG/2ML IJ SOLN
INTRAMUSCULAR | Status: DC | PRN
  Administered 2016-05-23: 25 ug via INTRAVENOUS

## 2016-05-23 MED ORDER — MIDAZOLAM HCL 10 MG/2ML IJ SOLN
INTRAMUSCULAR | Status: DC | PRN
  Administered 2016-05-23: 2 mg via INTRAVENOUS

## 2016-05-23 MED ORDER — MIDAZOLAM HCL 5 MG/ML IJ SOLN
INTRAMUSCULAR | Status: AC
  Filled 2016-05-23: qty 2

## 2016-05-23 MED ORDER — SODIUM CHLORIDE 0.9 % IV SOLN
Freq: Once | INTRAVENOUS | Status: AC
  Administered 2016-05-23: 19:00:00 via INTRAVENOUS
  Filled 2016-05-23: qty 1000

## 2016-05-23 MED ORDER — PHENOL 1.4 % MT LIQD
1.0000 | OROMUCOSAL | Status: DC | PRN
  Administered 2016-05-23: 1 via OROMUCOSAL
  Filled 2016-05-23: qty 177

## 2016-05-23 MED ORDER — SODIUM PHOSPHATES 45 MMOLE/15ML IV SOLN
10.0000 mmol | Freq: Once | INTRAVENOUS | Status: DC
  Filled 2016-05-23: qty 3.33

## 2016-05-23 MED ORDER — DEXMEDETOMIDINE HCL IN NACL 200 MCG/50ML IV SOLN
0.0000 ug/kg/h | INTRAVENOUS | Status: DC
  Administered 2016-05-23: 0.6 ug/kg/h via INTRAVENOUS
  Administered 2016-05-23: 0.4 ug/kg/h via INTRAVENOUS
  Filled 2016-05-23 (×2): qty 50

## 2016-05-23 MED ORDER — SODIUM CHLORIDE 0.9 % IV SOLN: INTRAVENOUS | Status: DC

## 2016-05-23 MED ORDER — SODIUM PHOSPHATES 45 MMOLE/15ML IV SOLN
10.0000 mmol | Freq: Once | INTRAVENOUS | Status: AC
  Administered 2016-05-23: 10 mmol via INTRAVENOUS
  Filled 2016-05-23: qty 3.33

## 2016-05-23 MED ORDER — FENTANYL CITRATE (PF) 100 MCG/2ML IJ SOLN
INTRAMUSCULAR | Status: AC
  Filled 2016-05-23: qty 2

## 2016-05-23 MED ORDER — SODIUM CHLORIDE 0.9 % IV SOLN
30.0000 meq | Freq: Two times a day (BID) | INTRAVENOUS | Status: DC
  Filled 2016-05-23: qty 15

## 2016-05-23 NOTE — Progress Notes (Signed)
[facility] Progress Note Patient Name: Jasani Lengel DOB: 09/07/1950 MRN: [id number]   Date of Service  05/23/2016  HPI/Events of Note  Potassium 3.2 after IV replacement. Potassium chloride ordered IV every 12 hours. Creatinine improved now 0.86. Has central venous access. Magnesium 2.4.   eICU Interventions  1. KCl 80 mEq 1 IV 2. Repeat electrolytes in the a.m.      Intervention Category Intermediate Interventions: Electrolyte abnormality - evaluation and management  Tera Partridge 05/23/2016, 5:30 PM

## 2016-05-23 NOTE — Procedures (Signed)
Extubation Procedure Note  Patient Details:   Name: Emily Cohen DOB: Mar 18, 1951 MRN: [id number]   Airway Documentation:     Evaluation  O2 sats: stable throughout Complications: No apparent complications Patient did tolerate procedure well. Bilateral Breath Sounds: Clear, Diminished   Yes  Pt extubated per Dr Nelda Marseille. Pt placed on 4lpm [place]   [name] 05/23/2016, 1:33 PM

## 2016-05-23 NOTE — Progress Notes (Signed)
PT Cancellation Note  Patient Details Name: Emily Cohen MRN: [id number] DOB: [birth year]   Cancelled Treatment:    Reason Eval/Treat Not Completed: Patient not medically ready, recently extubated   Duncan Dull 05/23/2016, 2:13 PM Alben Deeds, [place] DPT  [phone]

## 2016-05-23 NOTE — Op Note (Signed)
[facility] Patient Name: Emily Cohen Procedure Date : 05/23/2016 MRN: [id number] Attending MD: Milus Banister , MD Date of Birth: January 22, 1951 CSN: [id number] Age: 66 Admit Type: Inpatient Procedure:                Upper GI endoscopy Indications:              Hematemesis in setting of alcoholism and excessive                            NSAID use Providers:                Milus Banister, MD, Elmer Ramp. Tilden Dome, RN, Cletis Athens, Technician Referring MD:              Medicines:                General Anesthesia Complications:            No immediate complications. Estimated blood loss:                            None. Estimated Blood Loss:     Estimated blood loss: none. Procedure:                Pre-Anesthesia Assessment:                           - Prior to the procedure, a History and Physical                            was performed, and patient medications and                            allergies were reviewed. The patient's tolerance of                            previous anesthesia was also reviewed. The risks                            and benefits of the procedure and the sedation                            options and risks were discussed with the patient.                            All questions were answered, and informed consent                            was obtained. Prior Anticoagulants: The patient has                            taken no previous anticoagulant or antiplatelet                            agents. ASA Grade  Assessment: IV - A patient with                            severe systemic disease that is a constant threat                            to life. After reviewing the risks and benefits,                            the patient was deemed in satisfactory condition to                            undergo the procedure.                           After obtaining informed consent, the endoscope was     passed under direct vision. Throughout the                            procedure, the patient's blood pressure, pulse, and                            oxygen saturations were monitored continuously. The                            EG-[id number] ([id number]) scope was introduced through the                            mouth, and advanced to the second part of duodenum.                            The upper GI endoscopy was accomplished without                            difficulty. The patient tolerated the procedure                            well. Scope In: Scope Out: Findings:      The esophagus was normal.      There was a large amount of blood clot in the proximal stomach. One       non-bleeding cratered gastric ulcer with a small visible vessel was       found on the greater curvature of the stomach. The lesion was 13 mm in       largest dimension. This was not overtly neoplastic appearing. The ulcer       crater was successfully injected with of a 1:10,000 solution of       epinephrine for hemostasis. Coagulation for destruction of remaining       portion of lesion using bipolar probe was successful.      Diffuse moderate inflammation characterized by erosions, erythema and       friability was found in the entire examined stomach. Biopsies were taken       with a cold forceps for histology.      The examined duodenum was normal. Impression:               -  Blood clot in the stomach.                           - Medium to large non-bleeding gastric ulcer with                            visible vessel. The ulcer was treated with                            injection of dilute epinephrine and then                            application of co-aptive cautery with 7French Gold                            probe.                           - Moderate pan-gastritis was biopsied to check for                            H. pylori. Moderate Sedation:      none Recommendation:           - Observe patient  in ICU for rebleeding.                           - Continue NPO overnight.                           - Continue present medications. IV PPI drip. OK to                            d/c octreotide. Procedure Code(s):        --- Professional ---                           [phone], Esophagogastroduodenoscopy, flexible,                            transoral; with ablation of tumor(s), polyp(s), or                            other lesion(s) (includes pre- and post-dilation                            and guide wire passage, when performed)                           43239, Esophagogastroduodenoscopy, flexible,                            transoral; with biopsy, single or multiple Diagnosis Code(s):        --- Professional ---                           K25.4, Chronic or unspecified gastric ulcer with  hemorrhage                           K29.70, Gastritis, unspecified, without bleeding                           K92.0, Hematemesis CPT copyright 2016 [employer] Association. All rights reserved. The codes documented in this report are preliminary and upon coder review may  be revised to meet current compliance requirements. Milus Banister, MD 05/23/2016 12:07:06 PM This report has been signed electronically. Number of Addenda: 0

## 2016-05-23 NOTE — Interval H&P Note (Signed)
History and Physical Interval Note:  05/23/2016 11:49 AM  Emily Cohen  has presented today for surgery, with the diagnosis of upper gi bleeding  The various methods of treatment have been discussed with the patient and family. After consideration of risks, benefits and other options for treatment, the patient has consented to  Procedure(s): ESOPHAGOGASTRODUODENOSCOPY (EGD) (N/A) as a surgical intervention .  The patient's history has been reviewed, patient examined, no change in status, stable for surgery.  I have reviewed the patient's chart and labs.  Questions were answered to the patient's satisfaction.     Milus Banister

## 2016-05-23 NOTE — Progress Notes (Signed)
PCCM History and Physical Note  Admission date: 05/22/2016 Referring provider: Dr. Jeanell Sparrow  CC: Bleeding  HPI: Pt just intubated, sedated.  Hx from medical record and ER staff.  66 yo female fell at home.  Was found by roommate on floor.  She was throwing up blood.  She has hx of ETOH and last drink reported 5 days prior to admission,  In ER she was seen by GI.  Noted to take 2 aleve daily.  She continued to have bleeding, and there was concern about her ability to protect airway.  As a results she was intubated and had central venous access by EDP.  Vital signs: BP 108/88   Pulse (!) 104   Temp 100 F (37.8 C) (Oral)   Resp 10   Ht '5\' 6"'$  (1.676 m)   Wt 61.3 kg (135 lb 2.3 oz)   SpO2 100%   BMI 21.81 kg/m   Intake/output: I/O last 3 completed shifts: In: 3470.2 [I.V.:1461.8; Blood:1005; Other:20; IV Piggyback:983.3] Out: 1330 [Urine:1130; Emesis/NG output:200]  General: Chronically ill appearing female who appears acutely ill, on vent. Neuro: Alert and interactive, moving all ext to commands HEENT: ETT, NG tubes in place, [place]/AT, PERRL, EOM-I and MMM Cardiac: IRIR, Nl S1/S2, -M/R/G. Chest: No wheeze, Lt breast surgical removed Abd: Soft, NT, ND and +BS Ext: No edema, decreased muscle bulk Skin: No rashes   CMP Latest Ref Rng & Units 05/23/2016 05/22/2016 05/22/2016  Glucose 65 - 99 mg/dL 146(H) 152(H) QUESTIONABLE RESULTS, RECOMMEND RECOLLECT TO VERIFY  BUN 6 - 20 mg/dL 35(H) 49(H) QUESTIONABLE RESULTS, RECOMMEND RECOLLECT TO VERIFY  Creatinine 0.44 - 1.00 mg/dL 0.86 1.15(H) QUESTIONABLE RESULTS, RECOMMEND RECOLLECT TO VERIFY  Sodium 135 - 145 mmol/L 138 138 QUESTIONABLE RESULTS, RECOMMEND RECOLLECT TO VERIFY  Potassium 3.5 - 5.1 mmol/L 2.8(L) 2.5(LL) QUESTIONABLE RESULTS, RECOMMEND RECOLLECT TO VERIFY  Chloride 101 - 111 mmol/L 102 100(L) QUESTIONABLE RESULTS, RECOMMEND RECOLLECT TO VERIFY  CO2 22 - 32 mmol/L 23 20(L) QUESTIONABLE RESULTS, RECOMMEND RECOLLECT TO VERIFY  Calcium  8.9 - 10.3 mg/dL 8.2(L) 8.2(L) QUESTIONABLE RESULTS, RECOMMEND RECOLLECT TO VERIFY  Total Protein 6.5 - 8.1 g/dL 5.3(L) 5.5(L) QUESTIONABLE RESULTS, RECOMMEND RECOLLECT TO VERIFY  Total Bilirubin 0.3 - 1.2 mg/dL 1.1 0.8 QUESTIONABLE RESULTS, RECOMMEND RECOLLECT TO VERIFY  Alkaline Phos 38 - 126 U/L 84 87 QUESTIONABLE RESULTS, RECOMMEND RECOLLECT TO VERIFY  AST 15 - 41 U/L 34 36 QUESTIONABLE RESULTS, RECOMMEND RECOLLECT TO VERIFY  ALT 14 - 54 U/L 23 25 QUESTIONABLE RESULTS, RECOMMEND RECOLLECT TO VERIFY     CBC Latest Ref Rng & Units 05/23/2016 05/22/2016 05/22/2016  WBC 4.0 - 10.5 K/uL 16.8(H) 22.4(H) -  Hemoglobin 12.0 - 15.0 g/dL 12.0 12.3 10.2(L)  Hematocrit 36.0 - 46.0 % 34.1(L) 34.4(L) 29.0(L)  Platelets 150 - 400 K/uL 349 356 -     ABG    Component Value Date/Time   PHART 7.464 (H) 05/22/2016 2000   PCO2ART 27.9 (L) 05/22/2016 2000   PO2ART 142 (H) 05/22/2016 2000   HCO3 19.8 (L) 05/22/2016 2000   TCO2 26 05/22/2016 1801   ACIDBASEDEF 3.4 (H) 05/22/2016 2000   O2SAT 98.8 05/22/2016 2000     CBG (last 3)   Recent Labs  05/22/16 2337 05/23/16 0333 05/23/16 1204  GLUCAP 144* 145* 183*     Imaging: Dg Chest Port 1 View  Result Date: 05/23/2016 CLINICAL DATA:  Respiratory failure EXAM: PORTABLE CHEST 1 VIEW COMPARISON:  05/22/2016 FINDINGS: Endotracheal tube, right central line and NG tube remain  in place, unchanged. Minimal left base atelectasis. Right lung is clear. Heart is normal size. No effusions. IMPRESSION: Minimal left base atelectasis. Electronically Signed   By: Rolm Baptise M.D.   On: 05/23/2016 07:10   Dg Chest Port 1 View  Result Date: 05/22/2016 CLINICAL DATA:  Respiratory failure EXAM: PORTABLE CHEST 1 VIEW COMPARISON:  09/22/2015 FINDINGS: Endotracheal tube tip is approximately 2.4 cm superior to the carina. Esophageal tube tip is below the diaphragm but is not included. Right-sided central venous catheter tip overlies the mid SVC. There is no right  pneumothorax. No acute infiltrate or effusion.  Normal heart size. IMPRESSION: 1. Support lines and tubes as above 2. Clear lung fields Electronically Signed   By: Donavan Foil M.D.   On: 05/22/2016 18:08   Dg Abd Portable 1v  Result Date: 05/22/2016 CLINICAL DATA:  OG tube placement EXAM: PORTABLE ABDOMEN - 1 VIEW COMPARISON:  05/22/2016 FINDINGS: Partially visualized right-sided central venous catheter tip superimposes the distal SVC. Lung bases grossly clear. Esophageal tube tip overlies the distal stomach, side-port overlies the mid stomach. Upper intestinal gas pattern is grossly unremarkable. IMPRESSION: Esophageal tube tip overlies the distal stomach Electronically Signed   By: Donavan Foil M.D.   On: 05/22/2016 20:13   Dg Abd Portable 1 View  Result Date: 05/22/2016 CLINICAL DATA:  NG tube placement EXAM: PORTABLE ABDOMEN - 1 VIEW COMPARISON:  None. FINDINGS: 1600 hours. NG tube tip is in the region the pylorus in may be transpyloric into the proximal duodenum. Bowel gas pattern nonspecific. Telemetry leads overlie the chest. IMPRESSION: NG tube tip is at or potentially distal to the pylorus. Tube could be retracted about 5 cm for tip placement in the distal stomach. Electronically Signed   By: Misty Stanley M.D.   On: 05/22/2016 16:14     Studies:  Antibiotics: Rocephin 2/09 >>  Cultures: Blood 2/09 >>  Lines/tubes: ETT 2/09 >>2/10 CVL 2/09 >>  Events: 2/09 Admit, GI consulted  Summary: 66 yo female smoker  with hx of ETOH and NSAID use presents with upper GI bleeding.  Required intubation for airway protection.  Assessment/plan:  Upper GI bleeding: gastric ulcer on EGD addressed with no portal hypertension - D/C octreotide  - Protonix drip. - Rocephin per GI - NPO til tomorrow.  Acute blood loss anemia. - F/u CBC after transfusion - Transfuse for Hb < 7, hemodynamic compromise, or bleeding - INR 1.45  Compromised airway. - Extubate today - OOB - PT/OT -  Titrate O2 for sat of 88-92% - PRN BDs  Acute metabolic encephalopathy. Hx of ETOH. - RASS goal -1 to -2 - Thiamine and folic acid  A flutter. Hx of HTN. - Monitor heart rhythm  DM type II. - SSI - CBGs  DVT prophylaxis - SCDs SUP - protonix gtt Nutrition - NPO Goals of care - full code  Patient updated bedside  The patient is critically ill with multiple organ systems failure and requires high complexity decision making for assessment and support, frequent evaluation and titration of therapies, application of advanced monitoring technologies and extensive interpretation of multiple databases.   Critical Care Time devoted to patient care services described in this note is  35  Minutes. This time reflects time of care of this signee Dr Jennet Maduro. This critical care time does not reflect procedure time, or teaching time or supervisory time of PA/NP/Med student/Med Resident etc but could involve care discussion time.  Rush Farmer, M.D. [facility] Pulmonary/Critical Care Medicine.  Pager: [phone]. After hours pager: [phone].

## 2016-05-23 NOTE — Progress Notes (Signed)
Nutrition Brief Note  Patient identified on the Malnutrition Screening Tool (MST) Report.  Wt Readings from Last 15 Encounters:  05/23/16 135 lb 2.3 oz (61.3 kg)  02/20/13 138 lb 4.8 oz (62.7 kg)   Body mass index is 21.81 kg/m. Patient meets criteria for Healthy wt for ht based on current BMI.   On MST, Patient had reported that she had lost weight without trying, but has eaten well and has a good appetite. Pt reports that her UBW is 148.   There is no documentation available to show she has ever weighed this much.  She appears to have a hx of alcoholism and is a questionable historian.   She says she eats well and has a strong appetite. She was just extubate and is upset she can not have food at this moment.   No nutrition interventions warranted at this time. If nutrition issues arise, please consult RD.   Burtis Junes RD, LDN, CNSC Clinical Nutrition Pager: [phone] 05/23/2016 3:47 PM

## 2016-05-23 NOTE — H&P (View-Only) (Signed)
[facility] Consult: 4:29 PM 05/22/2016  LOS: 0 days    Referring Provider: ED PA Marcene Brawn.    Primary Care Physician:  Dr Vista Lawman. Primary Gastroenterologist:  unassigned     Reason for Consultation:  GI bleed and anemia.     HPI: Emily Cohen is a 66 y.o. female.  PMH alcoholism.  Pancreatitis in 03/2013, probably alcoholic. Left breast cancer and mastectomy before 2002.  Blindness.  Htn.   She has had previous ultrasound as well as CT scan. The CT scan from 05/2012 showed fatty liver and focal area of low density in the pancreatic head possibly representing focal pancreatitis or pancreatic carcinoma. Further workup with MRI post resolution of her acute symptoms was suggested.  However it doesn't look like she had any further imaging after 2014.  No previous upper endoscopy and colonoscopy.    Nausea and vomiting of bloody and sometimes dark emesis began yesterday. It was bloody from the get go. She also was passing red blood and dark, tarry, watery stools. She doesn't have abdominal pain. It's unusual for her to have GI upset and she rarely vomits. Patient takes 2 Aleve daily as she has done for at least a year.   She drinks malt liquor up to 40 ounces daily. 5 days ago she stopped drinking cold [place] because she is fed up with her drinking.  Patient found at home today down on the floor.  She fell on the floor and does not recall losing consciousness. She was not confused but she couldn't get herself up because she was too weak.Marland Kitchen She was covered with blood and vomited blood as well as passing dark stools and passing blood per rectum. Multiple labs are pending but so far we know that her hemoglobin is 6, her MCV is 82. She has good platelet count at 336 K.  FOBT positive.  Chemistries, coags, ammonia  level are all pending. Blood pressures reached as high as 203 over 96 and is currently in the 1 thirties over eighties. Tachycardia from 111-127.  Hypothermic with temperature 95.2 on arrival but 90 7.5 on recheck.     Past Medical History:  Diagnosis Date  . Alcohol use   . Arthritis   . Blindness   . Breast cancer, left breast ([place])   . Cancer ([place])   . Diabetes mellitus   . Hypertension     History reviewed. No pertinent surgical history.  Prior to Admission medications   Medication Sig Start Date End Date Taking? Authorizing Provider  amLODipine (NORVASC) 10 MG tablet Take 10 mg by mouth daily.    Historical Provider, MD  aspirin EC 81 MG tablet Take 81 mg by mouth daily.    Historical Provider, MD  hydrochlorothiazide (HYDRODIURIL) 25 MG tablet Take 25 mg by mouth daily.    Historical Provider, MD  ibuprofen (ADVIL,MOTRIN) 200 MG tablet Take 600 mg by mouth every 6 (six) hours as needed for pain.    Historical Provider, MD  lovastatin (MEVACOR) 20 MG tablet Take 20 mg by mouth at bedtime.  Historical Provider, MD  naproxen (NAPROSYN) 500 MG tablet Take 500 mg by mouth 2 (two) times daily with a meal.    Historical Provider, MD  ondansetron (ZOFRAN) 4 MG tablet Take 4 mg by mouth every 8 (eight) hours as needed for nausea or vomiting.    Historical Provider, MD  OVER THE COUNTER MEDICATION Take 3 tablets by mouth at bedtime. Over the Counter sleep aid -- patient takes 3 tablets every night    Historical Provider, MD  oxyCODONE-acetaminophen (PERCOCET) 10-325 MG per tablet Take 1-2 tablets by mouth every 6 (six) hours as needed for pain.    Historical Provider, MD  promethazine (PHENERGAN) 25 MG tablet Take 1 tablet (25 mg total) by mouth every 6 (six) hours as needed for nausea. Patient taking differently: Take 12.5-25 mg by mouth every 6 (six) hours as needed for nausea.  05/23/12   Davonna Belling, MD  venlafaxine XR (EFFEXOR-XR) 75 MG 24 hr capsule Take 75 mg by mouth daily  with breakfast.    Historical Provider, MD    Scheduled Meds: . octreotide  50 mcg Intravenous Once  . [START ON 05/26/2016] pantoprazole  40 mg Intravenous Q12H   Infusions: . cefTRIAXone (ROCEPHIN)  IV    . octreotide  (SANDOSTATIN)    IV infusion    . pantoprozole (PROTONIX) infusion     PRN Meds:    Allergies as of 05/22/2016  . (No Known Allergies)    History reviewed. No pertinent family history.  Social History   Social History  . Marital status: Single    Spouse name: N/A  . Number of children: N/A  . Years of education: N/A   Occupational History  . Not on file.   Social History Main Topics  . Smoking status: Current Every Day Smoker  . Smokeless tobacco: Never Used  . Alcohol use Yes     Comment: heavy drinker  . Drug use: No  . Sexual activity: Not on file   Other Topics Concern  . Not on file   Social History Narrative  . No narrative on file    REVIEW OF SYSTEMS: Constitutional:  Weakness as per HPI ENT:  No nose bleeds Pulm:  No SOB or cough.   CV:  No palpitations, no LE edema.  GU:  No hematuria, no frequency GI:  Per HPI.  No dysphagia.  Occasional heartburn. No previous history of hematemesis or of passing bloody stool. Heme:  No unusual bleeding or bruising.   Transfusions:  No previous transfusions. Neuro:  No recent or past history of seizures. She feels a bit tremulous today. Derm:  She gets pruritic lesions on her upper Endocrine:  No sweats or chills.  No polyuria or dysuria Immunization:  Didn't inquire as to recent or past immunizations. Travel:  None beyond local counties in last few months.    PHYSICAL EXAM: Vital signs in last 24 hours: Vitals:   05/22/16 1545 05/22/16 1545  BP: 130/89 130/89  Pulse: 112 112  Resp:  18  Temp:  97.5 F (36.4 C)   Wt Readings from Last 3 Encounters:  05/22/16 67.1 kg (148 lb)  02/20/13 62.7 kg (138 lb 4.8 oz)    General: Alert but acutely ill appearing AAF who has an emesis bag  at her mouth. Head:  No signs of head trauma. There is some dried blood in her hair.  Eyes:  No scleral icterus, no conjunctival pallor. Ears:  Not hard of hearing.  Nose:  NGT  is in place and is draining dark Mouth:  Oropharynx is moist and clear. Dentition is poor. Tongue is midline. Neck:  No masses, no thyromegaly, no JVD. Lungs:  Clear bilaterally.  Left mastectomy scar. Heart: RRR, tachy.   Abdomen:  Soft, NT, ND. Active bowel sounds.  No HSM, hernias or masses.  Rectal: Deferred rectal exam   Musc/Skeltl: No joint swelling, erythema or gross deformity. Extremities:  No CCE.  Neurologic:  Alert. Oriented times 3. No tremor, no asterixis. Mentation is a little bit slow but very precise and she is easy to understand. Skin:  On her upper back there are multiple scars which are hyper pigmented/dark and about 1 mm in size Tattoos:  None seen Nodes:  No cervical adenopathy.   Psych:  Calm, cooperative, seems sad and depressed.  Intake/Output from previous day: No intake/output data recorded. Intake/Output this shift: Total I/O In: 435 [Blood:335; IV Piggyback:100] Out: -   LAB RESULTS:  Recent Labs  05/22/16 1421  WBC 16.2*  HGB 6.0*  HCT 16.7*  PLT 336   BMET Lab Results  Component Value Date   NA 133 (L) 02/20/2013   NA 132 (L) 05/23/2012   NA 138 02/14/2011   K 4.6 02/20/2013   K 4.2 05/23/2012   K 4.0 02/14/2011   CL 97 02/20/2013   CL 95 (L) 05/23/2012   CL 105 02/14/2011   CO2 23 02/20/2013   CO2 22 05/23/2012   CO2 20 02/14/2011   GLUCOSE 126 (H) 02/20/2013   GLUCOSE 115 (H) 05/23/2012   GLUCOSE 97 02/14/2011   BUN 7 02/20/2013   BUN 8 05/23/2012   BUN 8 02/14/2011   CREATININE 0.73 02/20/2013   CREATININE 0.83 05/23/2012   CREATININE 0.67 02/14/2011   CALCIUM 9.6 02/20/2013   CALCIUM 10.0 05/23/2012   CALCIUM 9.3 02/14/2011   LFT No results for input(s): PROT, ALBUMIN, AST, ALT, ALKPHOS, BILITOT, BILIDIR, IBILI in the last 72 hours. PT/INR No  results found for: INR, PROTIME Hepatitis Panel No results for input(s): HEPBSAG, HCVAB, HEPAIGM, HEPBIGM in the last 72 hours. C-Diff No components found for: CDIFF Lipase     Component Value Date/Time   LIPASE 111 (H) 02/20/2013 1602    Drugs of Abuse     Component Value Date/Time   LABOPIA NONE DETECTED 02/14/2011 1748   COCAINSCRNUR NONE DETECTED 02/14/2011 1748   LABBENZ NONE DETECTED 02/14/2011 1748   AMPHETMU NONE DETECTED 02/14/2011 1748   THCU NONE DETECTED 02/14/2011 1748   LABBARB NONE DETECTED 02/14/2011 1748     RADIOLOGY STUDIES: Dg Abd Portable 1 View  Result Date: 05/22/2016 CLINICAL DATA:  NG tube placement EXAM: PORTABLE ABDOMEN - 1 VIEW COMPARISON:  None. FINDINGS: 1600 hours. NG tube tip is in the region the pylorus in may be transpyloric into the proximal duodenum. Bowel gas pattern nonspecific. Telemetry leads overlie the chest. IMPRESSION: NG tube tip is at or potentially distal to the pylorus. Tube could be retracted about 5 cm for tip placement in the distal stomach. Electronically Signed   By: Misty Stanley M.D.   On: 05/22/2016 16:14      IMPRESSION:   *  GI bleed with hematemesis and reports of both dark stools and hematochezia.  *  Atrial flutter.  *  Alcohol abuse.    *  Altered mental status, found down at home.    PLAN:     *  Octreotide and Protonix drips have been initiated by ED staff. Added daily  Rocephin indicated for GI bleeding in someone who may possibly have cirrhosis.  *  Plan upper endoscopy tomorrow once she has been stabilized though Dr. Ardis Hughs is available if this needs to be done emergently tonight.   *  Await coags, LFTs, ammonia level.   *  If she has coagulopathy this will need to be corrected with vitamin K and possibly FFP.  *  Red blood transfusion has already been started.  A total of 2 units are ordered..  *  Obviously she is not a candidate for anticoagulation despite the atrial flutter.   Azucena Freed   05/22/2016, 4:29 PM Pager: [phone]  ________________________________________________________________________  Velora Heckler GI MD note:  I personally examined the patient, reviewed the data and agree with the assessment and plan described above.  Acute UGI bleeding in alcoholic with daily NSAID use.  She needs stabilization, blood transfusion, admission to ICU, IV PPI drip and octreotide drip as well.  Await coags, if elevated she should get vit K as well as FFP.  I'm planning on EGD tomorrow AM, hoping to get some control, stablization with the above measures first.  If needed more emergently, I am available anytime this weekend.  Owens Loffler, MD [facility] Pager [phone]

## 2016-05-24 DIAGNOSIS — D62 Acute posthemorrhagic anemia: Secondary | ICD-10-CM

## 2016-05-24 DIAGNOSIS — J9690 Respiratory failure, unspecified, unspecified whether with hypoxia or hypercapnia: Secondary | ICD-10-CM

## 2016-05-24 DIAGNOSIS — F1010 Alcohol abuse, uncomplicated: Secondary | ICD-10-CM

## 2016-05-24 LAB — BASIC METABOLIC PANEL
Anion gap: 9 (ref 5–15)
BUN: 13 mg/dL (ref 6–20)
CO2: 24 mmol/L (ref 22–32)
CREATININE: 0.59 mg/dL (ref 0.44–1.00)
Calcium: 8.1 mg/dL — ABNORMAL LOW (ref 8.9–10.3)
Chloride: 112 mmol/L — ABNORMAL HIGH (ref 101–111)
GFR calc Af Amer: >60 mL/min (ref >60)
GLUCOSE: 115 mg/dL — AB (ref 65–99)
Potassium: 4 mmol/L (ref 3.5–5.1)
SODIUM: 145 mmol/L (ref 135–145)

## 2016-05-24 LAB — CBC
HCT: 25.6 % — ABNORMAL LOW (ref 36.0–46.0)
Hemoglobin: 8.8 g/dL — ABNORMAL LOW (ref 12.0–15.0)
MCH: 29.8 pg (ref 26.0–34.0)
MCHC: 34.4 g/dL (ref 30.0–36.0)
MCV: 86.8 fL (ref 78.0–100.0)
PLATELETS: 321 10*3/uL (ref 150–400)
RBC: 2.95 MIL/uL — ABNORMAL LOW (ref 3.87–5.11)
RDW: 16 % — ABNORMAL HIGH (ref 11.5–15.5)
WBC: 14.2 10*3/uL — ABNORMAL HIGH (ref 4.0–10.5)

## 2016-05-24 LAB — GLUCOSE, CAPILLARY
GLUCOSE-CAPILLARY: 112 mg/dL — AB (ref 65–99)
GLUCOSE-CAPILLARY: 90 mg/dL (ref 65–99)
Glucose-Capillary: 112 mg/dL — ABNORMAL HIGH (ref 65–99)
Glucose-Capillary: 112 mg/dL — ABNORMAL HIGH (ref 65–99)
Glucose-Capillary: 117 mg/dL — ABNORMAL HIGH (ref 65–99)

## 2016-05-24 LAB — MAGNESIUM: MAGNESIUM: 1.7 mg/dL (ref 1.7–2.4)

## 2016-05-24 LAB — PHOSPHORUS: Phosphorus: 1.3 mg/dL — ABNORMAL LOW (ref 2.5–4.6)

## 2016-05-24 NOTE — Progress Notes (Signed)
[facility] Progress Note    Since last GI note: EGD yesterday in ICU while she was still intubated. See full report in chart.  Large gastric ulcer with visible vessel, treated with epinephrine injection and cautery.  She had one marroon stool overnight but no more hematemesis.  Has been HD stable.   She feels much better overall.  Would like to try liquids today.    Objective: Vital signs in last 24 hours: Temp:  [97.7 F (36.5 C)-100 F (37.8 C)] 98.3 F (36.8 C) (02/11 0700) Pulse Rate:  [88-106] 88 (02/11 0700) Resp:  [10-26] 19 (02/11 0700) BP: (80-118)/(45-88) 101/62 (02/11 0700) SpO2:  [93 %-100 %] 100 % (02/11 0700) FiO2 (%):  [30 %] 30 % (02/10 0835) Last BM Date: 05/24/16 General: alert and oriented times 3 Heart: regular rate and rythm Abdomen: soft, non-tender, non-distended, normal bowel sounds   Lab Results:  Recent Labs  05/22/16 2347 05/23/16 0333 05/24/16 0522  WBC 22.4* 16.8* PENDING  HGB 12.3 12.0 8.8*  PLT 356 349 321  MCV 84.5 84.0 86.8    Recent Labs  05/22/16 2130 05/23/16 0333 05/23/16 1352 05/24/16 0522  NA 138 138  --  145  K 2.5* 2.8* 3.2* 4.0  CL 100* 102  --  112*  CO2 20* 23  --  24  GLUCOSE 152* 146*  --  115*  BUN 49* 35*  --  13  CREATININE 1.15* 0.86  --  0.59  CALCIUM 8.2* 8.2*  --  8.1*    Recent Labs  05/22/16 1430 05/22/16 2130 05/23/16 0333  PROT QUESTIONABLE RESULTS, RECOMMEND RECOLLECT TO VERIFY 5.5* 5.3*  ALBUMIN QUESTIONABLE RESULTS, RECOMMEND RECOLLECT TO VERIFY 2.8* 2.7*  AST QUESTIONABLE RESULTS, RECOMMEND RECOLLECT TO VERIFY 36 34  ALT QUESTIONABLE RESULTS, RECOMMEND RECOLLECT TO VERIFY 25 23  ALKPHOS QUESTIONABLE RESULTS, RECOMMEND RECOLLECT TO VERIFY 87 84  BILITOT QUESTIONABLE RESULTS, RECOMMEND RECOLLECT TO VERIFY 0.8 1.1    Recent Labs  05/22/16 1430  INR 1.45    Medications: Scheduled Meds: . cefTRIAXone (ROCEPHIN)  IV  2 g Intravenous Q24H  . chlorhexidine gluconate (MEDLINE  KIT)  15 mL Mouth Rinse BID  . Chlorhexidine Gluconate Cloth  6 each Topical Q0600  . folic acid  1 mg Intravenous Daily  . insulin aspart  0-15 Units Subcutaneous Q4H  . mouth rinse  15 mL Mouth Rinse QID  . [START ON 05/26/2016] pantoprazole  40 mg Intravenous Q12H  . thiamine injection  100 mg Intravenous Daily   Continuous Infusions: . sodium chloride 50 mL/hr at 05/24/16 0700  . pantoprozole (PROTONIX) infusion 8 mg/hr (05/24/16 0700)   PRN Meds:.albuterol, ondansetron (ZOFRAN) IV, phenol    Assessment/Plan: 66 y.o. female with upper GI bleed from gastric ulcer that is likely NSAID (naproxen) related.  She's been taking 1035m of naproxen daily for about two years.  Not on PPI.  I biopsies her stomach to see if she has H. Pylori infection but certainly that dose of NSAID could explain her GU.  For now, OK to advance to clears today. Continue PPI drip.  She will eventually need repeat EGD (2 months) to confirm ulcer healing.   She is an alcoholic, but no signs of portal hypertension on EGD; her platelets, total bili are normal and INR was only slightly elevated so I do not think she has underlying cirrhosis yet.  Will follow along.   JMilus Banister MD  05/24/2016, 8:13 AM [facility] Pager ([phone]

## 2016-05-24 NOTE — Evaluation (Signed)
Physical Therapy Evaluation Patient Details Name: Emily Cohen MRN: [id number] DOB: Aug 09, 1950 Today's Date: 05/24/2016   History of Present Illness  Patient is a 66 yo female admitted 05/22/16 after being found down, bleeding from mouth.  Patient with UGI bleed (large gastric ulcer).  Patient extubated 05/23/16.    PMH:  ETOH use, HTN, DM, arthritis, blindness  Clinical Impression  Patient presents with problems listed below.  Will benefit from acute PT to maximize functional mobility prior to discharge. Recommend HHPT at d/c for continued therapy.  Anticipate patient will progress well with mobility.    Follow Up Recommendations Home health PT;Supervision - Intermittent    Equipment Recommendations  Rolling walker with 5" wheels;3in1 (PT)    Recommendations for Other Services       Precautions / Restrictions Precautions Precautions: Fall Restrictions Weight Bearing Restrictions: No      Mobility  Bed Mobility Overal bed mobility: Needs Assistance Bed Mobility: Supine to Sit;Sit to Supine     Supine to sit: Min guard Sit to supine: Min guard   General bed mobility comments: No physical assist required.  Min guard assist for safety.  Transfers Overall transfer level: Needs assistance Equipment used: None Transfers: Sit to/from Stand Sit to Stand: Min assist;+2 safety/equipment         General transfer comment: Min assist to steady during transfers.  Patient able to take steps in place with min assist for balance/safety.  Fatigued and returned to sitting.  Ambulation/Gait             General Gait Details: NT  Stairs            Wheelchair Mobility    Modified Rankin (Stroke Patients Only)       Balance Overall balance assessment: Needs assistance Sitting-balance support: No upper extremity supported;Feet supported Sitting balance-Leahy Scale: Good     Standing balance support: No upper extremity supported Standing balance-Leahy Scale:  Fair                               Pertinent Vitals/Pain Pain Assessment: No/denies pain    Home Living Family/patient expects to be discharged to:: Private residence Living Arrangements: Non-relatives/Friends (Roommate - does not assist patient) Available Help at Discharge: Personal care attendant;Available PRN/intermittently (2.5 hours/day) Type of Home: Mobile home Home Access: Stairs to enter Entrance Stairs-Rails: Psychiatric nurse of Steps: 3 Home Layout: One level Home Equipment: None      Prior Function Level of Independence: Independent         Comments: Patient is "furniture walker" in home for stability     Hand Dominance        Extremity/Trunk Assessment   Upper Extremity Assessment Upper Extremity Assessment: Generalized weakness    Lower Extremity Assessment Lower Extremity Assessment: Generalized weakness       Communication   Communication: No difficulties  Cognition Arousal/Alertness: Awake/alert Behavior During Therapy: WFL for tasks assessed/performed (Very positive and talkative) Overall Cognitive Status: Within Functional Limits for tasks assessed                      General Comments      Exercises     Assessment/Plan    PT Assessment Patient needs continued PT services  PT Problem List Decreased strength;Decreased activity tolerance;Decreased balance;Decreased mobility;Decreased knowledge of use of DME          PT Treatment Interventions DME instruction;Gait training;Stair  training;Functional mobility training;Therapeutic activities;Therapeutic exercise;Balance training;Patient/family education    PT Goals (Current goals can be found in the Care Plan section)  Acute Rehab PT Goals Patient Stated Goal: To return home PT Goal Formulation: With patient Time For Goal Achievement: 05/31/16 Potential to Achieve Goals: Good    Frequency Min 3X/week   Barriers to discharge Decreased  caregiver support Caregiver available prn and family out of town    Co-evaluation               End of Session   Activity Tolerance: Patient tolerated treatment well;Patient limited by fatigue Patient left: in bed;with call bell/phone within reach;with SCD's reapplied Nurse Communication: Mobility status         Time: 1340-1353 PT Time Calculation (min) (ACUTE ONLY): 13 min   Charges:   PT Evaluation $PT Eval Moderate Complexity: 1 Procedure     PT G Codes:        Despina Pole 06-06-2016, 7:47 PM Carita Pian. Sanjuana Kava, [place] Pager [phone]

## 2016-05-24 NOTE — Progress Notes (Signed)
PCCM History and Physical Note  Admission date: 05/22/2016 Referring provider: Dr. Jeanell Sparrow  CC: Bleeding  HPI: Pt just intubated, sedated.  Hx from medical record and ER staff.  66 yo female fell at home.  Was found by roommate on floor.  She was throwing up blood.  She has hx of ETOH and last drink reported 5 days prior to admission,  In ER she was seen by GI.  Noted to take 2 aleve daily.  She continued to have bleeding, and there was concern about her ability to protect airway.  As a results she was intubated and had central venous access by EDP.  Vital signs: BP (!) 104/58   Pulse 89   Temp 98.3 F (36.8 C) (Oral)   Resp 20   Ht '5\' 6"'$  (1.676 m)   Wt 135 lb 2.3 oz (61.3 kg)   SpO2 100%   BMI 21.81 kg/m   Intake/output: I/O last 3 completed shifts: In: 5582.3 [I.V.:3475.7; Blood:670; Other:250; IV Piggyback:1186.7] Out: 4008 [Urine:3105; Emesis/NG output:200]  General: Chronically ill appearing female who is awake and alert on room air Neuro: Alert and interactive, moving all ext to commands HEENT:[place]/AT, PERRL, EOM-I and MMM Cardiac: IRIR, Nl S1/S2, -M/R/G. Chest: No wheeze, Lt breast surgical removed Abd: Soft, NT, ND and +BS Ext: No edema, decreased muscle bulk Skin: No rashes   CMP Latest Ref Rng & Units 05/24/2016 05/23/2016 05/23/2016  Glucose 65 - 99 mg/dL 115(H) - 146(H)  BUN 6 - 20 mg/dL 13 - 35(H)  Creatinine 0.44 - 1.00 mg/dL 0.59 - 0.86  Sodium 135 - 145 mmol/L 145 - 138  Potassium 3.5 - 5.1 mmol/L 4.0 3.2(L) 2.8(L)  Chloride 101 - 111 mmol/L 112(H) - 102  CO2 22 - 32 mmol/L 24 - 23  Calcium 8.9 - 10.3 mg/dL 8.1(L) - 8.2(L)  Total Protein 6.5 - 8.1 g/dL - - 5.3(L)  Total Bilirubin 0.3 - 1.2 mg/dL - - 1.1  Alkaline Phos 38 - 126 U/L - - 84  AST 15 - 41 U/L - - 34  ALT 14 - 54 U/L - - 23     CBC Latest Ref Rng & Units 05/24/2016 05/23/2016 05/22/2016  WBC 4.0 - 10.5 K/uL PENDING 16.8(H) 22.4(H)  Hemoglobin 12.0 - 15.0 g/dL 8.8(L) 12.0 12.3  Hematocrit 36.0 -  46.0 % 25.6(L) 34.1(L) 34.4(L)  Platelets 150 - 400 K/uL 321 349 356     ABG    Component Value Date/Time   PHART 7.464 (H) 05/22/2016 2000   PCO2ART 27.9 (L) 05/22/2016 2000   PO2ART 142 (H) 05/22/2016 2000   HCO3 19.8 (L) 05/22/2016 2000   TCO2 26 05/22/2016 1801   ACIDBASEDEF 3.4 (H) 05/22/2016 2000   O2SAT 98.8 05/22/2016 2000     CBG (last 3)   Recent Labs  05/23/16 2012 05/24/16 0010 05/24/16 0404  GLUCAP 124* 112* 112*     Imaging: Dg Chest Port 1 View  Result Date: 05/23/2016 CLINICAL DATA:  Respiratory failure EXAM: PORTABLE CHEST 1 VIEW COMPARISON:  05/22/2016 FINDINGS: Endotracheal tube, right central line and NG tube remain in place, unchanged. Minimal left base atelectasis. Right lung is clear. Heart is normal size. No effusions. IMPRESSION: Minimal left base atelectasis. Electronically Signed   By: Rolm Baptise M.D.   On: 05/23/2016 07:10   Dg Chest Port 1 View  Result Date: 05/22/2016 CLINICAL DATA:  Respiratory failure EXAM: PORTABLE CHEST 1 VIEW COMPARISON:  09/22/2015 FINDINGS: Endotracheal tube tip is approximately 2.4 cm superior  to the carina. Esophageal tube tip is below the diaphragm but is not included. Right-sided central venous catheter tip overlies the mid SVC. There is no right pneumothorax. No acute infiltrate or effusion.  Normal heart size. IMPRESSION: 1. Support lines and tubes as above 2. Clear lung fields Electronically Signed   By: Donavan Foil M.D.   On: 05/22/2016 18:08   Dg Abd Portable 1v  Result Date: 05/22/2016 CLINICAL DATA:  OG tube placement EXAM: PORTABLE ABDOMEN - 1 VIEW COMPARISON:  05/22/2016 FINDINGS: Partially visualized right-sided central venous catheter tip superimposes the distal SVC. Lung bases grossly clear. Esophageal tube tip overlies the distal stomach, side-port overlies the mid stomach. Upper intestinal gas pattern is grossly unremarkable. IMPRESSION: Esophageal tube tip overlies the distal stomach Electronically  Signed   By: Donavan Foil M.D.   On: 05/22/2016 20:13   Dg Abd Portable 1 View  Result Date: 05/22/2016 CLINICAL DATA:  NG tube placement EXAM: PORTABLE ABDOMEN - 1 VIEW COMPARISON:  None. FINDINGS: 1600 hours. NG tube tip is in the region the pylorus in may be transpyloric into the proximal duodenum. Bowel gas pattern nonspecific. Telemetry leads overlie the chest. IMPRESSION: NG tube tip is at or potentially distal to the pylorus. Tube could be retracted about 5 cm for tip placement in the distal stomach. Electronically Signed   By: Misty Stanley M.D.   On: 05/22/2016 16:14     Studies:  Antibiotics: Rocephin 2/09 >>  Cultures: Blood 2/09 >>  Lines/tubes: ETT 2/09 >>2/10 CVL 2/09 >>  Events: 2/09 Admit, GI consulted  Summary: 66 yo female smoker  with hx of ETOH and NSAID use presents with upper GI bleeding.  Required intubation for airway protection. Extubated 2/10 after EGD revealed gastric ulcer that was treated with epi/  Assessment/plan:  Upper GI bleeding: gastric ulcer on EGD addressed with no portal hypertension - D/C octreotide  - Protonix drip. Per GI - Rocephin per GI - advance diet  Acute blood loss anemia. - F/u CBC after transfusion - Transfuse for Hb < 7, hemodynamic compromise, or bleeding - INR 1.45  Compromised airway. - Extubated 2/10 and on room air - OOB - PT/OT - Titrate O2 for sat of 88-92% - PRN BDs  Acute metabolic encephalopathy. Resolved Hx of ETOH. - RASS goal -1 to -2 - Thiamine and folic acid  A flutter. Hx of HTN. - Monitor heart rhythm  DM type II. CBG (last 3)   Recent Labs  05/23/16 2012 05/24/16 0010 05/24/16 0404  GLUCAP 124* 112* 112*     - SSI - CBGs  DVT prophylaxis - SCDs SUP - protonix gtt Nutrition - Advance diet to clears 2/11 Goals of care - full code 2/11 transfer to SDU and to [facility]    App cct 30 min  Richardson Landry Minor ACNP Maryanna Shape PCCM Pager [phone] till 3 pm If no answer page  [phone] 05/24/2016, 11:24 AM . Attending Note:  66 year old female with etoh abuse history presenting with gastric ulcer and upper GI bleeding, intubated for airway protection and extubated 2/10.  On exam, she is alert and oriented, moving all ext to command and lungs are clear.  I reviewed CXR myself, no acute disease noted.  Discussed with TRH-MD and PCCM-NP.  Upper GI Bleeding:  - Protonix drip  - GI following  - Diet  Etoh abuse:  - CIWA  - Thiamine/folate  - Monitor  DM:  - CBG  - ISS  Hemorrhagic anemia:  -  Transfuse as ordered  - F/U CBC  - Treat GI source  Disposition:  - SDU transfer  - Transition care to [facility] with PCCM off 1/12  Patient seen and examined, agree with above note.  I dictated the care and orders written for this patient under my direction.  Rush Farmer, MD [phone]

## 2016-05-25 ENCOUNTER — Encounter (HOSPITAL_COMMUNITY): Payer: Self-pay | Admitting: Gastroenterology

## 2016-05-25 LAB — GLUCOSE, CAPILLARY
GLUCOSE-CAPILLARY: 89 mg/dL (ref 65–99)
GLUCOSE-CAPILLARY: 91 mg/dL (ref 65–99)
GLUCOSE-CAPILLARY: 95 mg/dL (ref 65–99)
Glucose-Capillary: 95 mg/dL (ref 65–99)
Glucose-Capillary: 104 mg/dL — ABNORMAL HIGH (ref 65–99)
Glucose-Capillary: 93 mg/dL (ref 65–99)

## 2016-05-25 LAB — BASIC METABOLIC PANEL
Anion gap: 8 (ref 5–15)
Anion gap: 6 (ref 5–15)
BUN: <5 mg/dL — ABNORMAL LOW (ref 6–20)
CALCIUM: 8.2 mg/dL — AB (ref 8.9–10.3)
CHLORIDE: 109 mmol/L (ref 101–111)
CO2: 25 mmol/L (ref 22–32)
CO2: 25 mmol/L (ref 22–32)
CREATININE: 0.58 mg/dL (ref 0.44–1.00)
Calcium: 7.9 mg/dL — ABNORMAL LOW (ref 8.9–10.3)
Chloride: 111 mmol/L (ref 101–111)
Creatinine, Ser: 0.56 mg/dL (ref 0.44–1.00)
GFR calc Af Amer: >60 mL/min (ref >60)
GFR calc non Af Amer: >60 mL/min (ref >60)
Glucose, Bld: 96 mg/dL (ref 65–99)
Glucose, Bld: 116 mg/dL — ABNORMAL HIGH (ref 65–99)
POTASSIUM: 2.9 mmol/L — AB (ref 3.5–5.1)
Potassium: 3.3 mmol/L — ABNORMAL LOW (ref 3.5–5.1)
SODIUM: 142 mmol/L (ref 135–145)
SODIUM: 142 mmol/L (ref 135–145)

## 2016-05-25 LAB — CBC
HEMATOCRIT: 25.2 % — AB (ref 36.0–46.0)
HEMOGLOBIN: 8.5 g/dL — AB (ref 12.0–15.0)
MCH: 29.7 pg (ref 26.0–34.0)
MCHC: 33.7 g/dL (ref 30.0–36.0)
MCV: 88.1 fL (ref 78.0–100.0)
Platelets: 321 10*3/uL (ref 150–400)
RBC: 2.86 MIL/uL — AB (ref 3.87–5.11)
RDW: 16.4 % — ABNORMAL HIGH (ref 11.5–15.5)
WBC: 15.2 10*3/uL — ABNORMAL HIGH (ref 4.0–10.5)

## 2016-05-25 MED ORDER — WHITE PETROLATUM GEL
Status: AC
  Administered 2016-05-25: 13:00:00
  Filled 2016-05-25: qty 1

## 2016-05-25 MED ORDER — INSULIN ASPART 100 UNIT/ML ~~LOC~~ SOLN: 0.0000 [IU] | Freq: Every day | SUBCUTANEOUS | Status: DC

## 2016-05-25 MED ORDER — PANTOPRAZOLE SODIUM 40 MG PO TBEC: 40.0000 mg | DELAYED_RELEASE_TABLET | Freq: Two times a day (BID) | ORAL | Status: DC

## 2016-05-25 MED ORDER — ZOLPIDEM TARTRATE 5 MG PO TABS
5.0000 mg | ORAL_TABLET | Freq: Every evening | ORAL | Status: DC | PRN
  Administered 2016-05-25: 5 mg via ORAL
  Filled 2016-05-25: qty 1

## 2016-05-25 MED ORDER — POTASSIUM CHLORIDE CRYS ER 20 MEQ PO TBCR
40.0000 meq | EXTENDED_RELEASE_TABLET | ORAL | Status: AC
  Administered 2016-05-25 (×2): 40 meq via ORAL
  Filled 2016-05-25 (×2): qty 2

## 2016-05-25 MED ORDER — INSULIN ASPART 100 UNIT/ML ~~LOC~~ SOLN: 0.0000 [IU] | Freq: Three times a day (TID) | SUBCUTANEOUS | Status: DC

## 2016-05-25 MED ORDER — PANTOPRAZOLE SODIUM 40 MG PO TBEC
40.0000 mg | DELAYED_RELEASE_TABLET | Freq: Two times a day (BID) | ORAL | Status: DC
  Administered 2016-05-25 – 2016-05-26 (×2): 40 mg via ORAL
  Filled 2016-05-25 (×3): qty 1

## 2016-05-25 NOTE — Progress Notes (Signed)
Report called to [facility]

## 2016-05-25 NOTE — Progress Notes (Signed)
[facility] TEAM 1 - Stepdown/ICU TEAM  Emily Cohen  [name]:[id number] DOB: January 04, 1951 DOA: 05/22/2016 PCP: Default, Provider, MD    Brief Narrative:  66 yo female who fell at home and was found by her roommate on the floor throwing up blood.  She has hx of ETOH abuse, w/ her last drink reportedly 5 days prior to admission.  In the ER she continued to have bleeding, and there was concern about her ability to protect her airway.  She was intubated.  Significant Events: 2/9 admit w/ GIB - intubated  2/10 EGD > gastric ulcer tx w/ epi - extubated   Subjective: Pt is quite cheerful and has no complaints.  She is hungry and wished to eat "real food."  She denies cp, n/v, or abdom pain.  She has a dark black stool this morning.    Assessment & Plan:  Upper GI bleeding - large gastric ulcer Noted via EGD w/ epi tx - no portal hypertension appreciated - felt to be NSAID related - now off octreotide - GI following - needs f/u EGD in 2 months - Protonix gtt per GI - diet being advanced by GI   Acute blood loss anemia Due to above - Hgb appears to be holding steady but will need to follow trend further to be sure   Recent Labs Lab 05/22/16 2000 05/22/16 2347 05/23/16 0333 05/24/16 0522 05/25/16 0438  HGB 10.2* 12.3 12.0 8.8* 8.5*    Compromised airway Extubated 2/10 - on room air - no persisting respiratory issues   Acute metabolic encephalopathy Resolved  ETOH abuse  No evidence of withdrawal at this time   A fib? Noted on admit EKG - no prior hx of same noted but minimal old recoreds available - recheck 12 lead EKG - follow on tele   HTN BP currently well controlled - follow w/o change in tx  DM2 CBG well controlled - follow  Hypokalemia  Due to poor intake - supplement and follow - check Mg  DVT prophylaxis: SCDs Code Status: FULL CODE Family Communication: no family present at time of exam  Disposition Plan: transfer to tele bed - follow Hgb - advance diet and  transition to oral PPI at discretion of GI - PT/OT   Consultants:  PCCM GI  Antimicrobials:  Rocephin 2/9 >  Objective: Blood pressure (!) 106/56, pulse 88, temperature 99.5 F (37.5 C), temperature source Oral, resp. rate 15, height '5\' 6"'$  (1.676 m), weight 63.7 kg (140 lb 6.9 oz), SpO2 100 %.  Intake/Output Summary (Last 24 hours) at 05/25/16 0839 Last data filed at 05/25/16 0800  Gross per 24 hour  Intake             1625 ml  Output                0 ml  Net             1625 ml   Filed Weights   05/22/16 2200 05/23/16 0027 05/25/16 0524  Weight: 61.5 kg (135 lb 9.3 oz) 61.3 kg (135 lb 2.3 oz) 63.7 kg (140 lb 6.9 oz)    Examination: General: No acute respiratory distress Lungs: Clear to auscultation bilaterally without wheezes or crackles Cardiovascular: Regular rate and rhythm without murmur gallop or rub normal S1 and S2 Abdomen: Nontender, nondistended, soft, bowel sounds positive, no rebound, no ascites, no appreciable mass Extremities: No significant cyanosis, clubbing, or edema bilateral lower extremities  CBC:  Recent Labs Lab 05/22/16 1421  05/22/16 2000 05/22/16 2347 05/23/16 0333 05/24/16 0522 05/25/16 0438  WBC 16.2*  --  22.4* 16.8* 14.2* 15.2*  HGB 6.0* 10.2* 12.3 12.0 8.8* 8.5*  HCT 16.7* 29.0* 34.4* 34.1* 25.6* 25.2*  MCV 82.3  --  84.5 84.0 86.8 88.1  PLT 336  --  356 349 321 540   Basic Metabolic Panel:  Recent Labs Lab 05/22/16 1430 05/22/16 2130 05/23/16 0333 05/23/16 1352 05/24/16 0522 05/25/16 0438  NA QUESTIONABLE RESULTS, RECOMMEND RECOLLECT TO VERIFY 138 138  --  145 142  K QUESTIONABLE RESULTS, RECOMMEND RECOLLECT TO VERIFY 2.5* 2.8* 3.2* 4.0 2.9*  CL QUESTIONABLE RESULTS, RECOMMEND RECOLLECT TO VERIFY 100* 102  --  112* 109  CO2 QUESTIONABLE RESULTS, RECOMMEND RECOLLECT TO VERIFY 20* 23  --  24 25  GLUCOSE QUESTIONABLE RESULTS, RECOMMEND RECOLLECT TO VERIFY 152* 146*  --  115* 96  BUN QUESTIONABLE RESULTS, RECOMMEND RECOLLECT TO  VERIFY 49* 35*  --  13 <5*  CREATININE QUESTIONABLE RESULTS, RECOMMEND RECOLLECT TO VERIFY 1.15* 0.86  --  0.59 0.56  CALCIUM QUESTIONABLE RESULTS, RECOMMEND RECOLLECT TO VERIFY 8.2* 8.2*  --  8.1* 7.9*  MG  --  1.5* 2.4  --  1.7  --   PHOS  --  3.6 2.6  --  1.3*  --    GFR: Estimated Creatinine Clearance: 66.5 mL/min (by C-G formula based on SCr of 0.56 mg/dL).  Liver Function Tests:  Recent Labs Lab 05/22/16 1430 05/22/16 2130 05/23/16 0333  AST QUESTIONABLE RESULTS, RECOMMEND RECOLLECT TO VERIFY 36 34  ALT QUESTIONABLE RESULTS, RECOMMEND RECOLLECT TO VERIFY 25 23  ALKPHOS QUESTIONABLE RESULTS, RECOMMEND RECOLLECT TO VERIFY 87 84  BILITOT QUESTIONABLE RESULTS, RECOMMEND RECOLLECT TO VERIFY 0.8 1.1  PROT QUESTIONABLE RESULTS, RECOMMEND RECOLLECT TO VERIFY 5.5* 5.3*  ALBUMIN QUESTIONABLE RESULTS, RECOMMEND RECOLLECT TO VERIFY 2.8* 2.7*    Recent Labs Lab 05/22/16 2022  AMMONIA 31    Coagulation Profile:  Recent Labs Lab 05/22/16 1430  INR 1.45    Cardiac Enzymes:  Recent Labs Lab 05/22/16 1430 05/22/16 2130  CKTOTAL 56 183    HbA1C: No results found for: HGBA1C  CBG:  Recent Labs Lab 05/24/16 1552 05/24/16 1929 05/25/16 0011 05/25/16 0402 05/25/16 0802  GLUCAP 112* 117* 89 95 95    Recent Results (from the past 240 hour(s))  Blood culture (routine x 2)     Status: None (Preliminary result)   Collection Time: 05/22/16  2:30 PM  Result Value Ref Range Status   Specimen Description BLOOD LEFT WRIST  Final   Special Requests BOTTLES DRAWN AEROBIC ONLY 5CC  Final   Culture NO GROWTH 2 DAYS  Final   Report Status PENDING  Incomplete  MRSA PCR Screening     Status: None   Collection Time: 05/22/16  7:18 PM  Result Value Ref Range Status   MRSA by PCR NEGATIVE NEGATIVE Final    Comment:        The GeneXpert MRSA Assay (FDA approved for NASAL specimens only), is one component of a comprehensive MRSA colonization surveillance program. It is  not intended to diagnose MRSA infection nor to guide or monitor treatment for MRSA infections.   Blood culture (routine x 2)     Status: None (Preliminary result)   Collection Time: 05/22/16  7:33 PM  Result Value Ref Range Status   Specimen Description BLOOD RIGHT ANTECUBITAL  Final   Special Requests BOTTLES DRAWN AEROBIC ONLY 6CC  Final   Culture NO GROWTH 2 DAYS  Final   Report Status PENDING  Incomplete     Scheduled Meds: . cefTRIAXone (ROCEPHIN)  IV  2 g Intravenous Q24H  . Chlorhexidine Gluconate Cloth  6 each Topical Q0600  . folic acid  1 mg Intravenous Daily  . insulin aspart  0-15 Units Subcutaneous Q4H  . pantoprazole  40 mg Oral BID  . potassium chloride  40 mEq Oral Q4H  . thiamine injection  100 mg Intravenous Daily   Continuous Infusions: . sodium chloride 50 mL/hr at 05/25/16 0800  . pantoprozole (PROTONIX) infusion 8 mg/hr (05/25/16 0800)     LOS: 3 days   Cherene Altes, MD [facility] Office  [phone] Pager - Text Page per Amion as per below:  On-Call/Text Page:      [email or web address]      password [email or web address]  If 7PM-7AM, please contact night-coverage [email or web address] Password [facility] 05/25/2016, 8:39 AM

## 2016-05-25 NOTE — Progress Notes (Signed)
Physical Therapy Treatment Patient Details Name: Emily Cohen MRN: [id number] DOB: 07-Sep-1950 Today's Date: 05/25/2016    History of Present Illness Patient is a 66 yo female admitted 05/22/16 after being found down, bleeding from mouth.  Patient with UGI bleed (large gastric ulcer).  Patient extubated 05/23/16.    PMH:  ETOH use, HTN, DM, arthritis, blindness    PT Comments    Pt very jovial and eager to mobilize. Pt states she has roommate who can assist at home, rails on her stairs and aide for ADLs. Pt educated for mobility progression, HEP and RW use. Encouraged daily mobility with nursing and will continue to follow to return to PLOF.  BP supine 106/56 115/68 sitting Hr 97-125 sats 100% on RA  Follow Up Recommendations  Home health PT;Supervision - Intermittent     Equipment Recommendations  Rolling walker with 5" wheels;3in1 (PT)    Recommendations for Other Services       Precautions / Restrictions Precautions Precautions: Fall Restrictions Weight Bearing Restrictions: No    Mobility  Bed Mobility               General bed mobility comments: pt sitting EOB on arrival  Transfers Overall transfer level: Modified independent                  Ambulation/Gait Ambulation/Gait assistance: Supervision Ambulation Distance (Feet): 300 Feet Assistive device: Rolling walker (2 wheeled) Gait Pattern/deviations: Step-through pattern;Decreased stride length   Gait velocity interpretation: Below normal speed for age/gender General Gait Details: pt with good activity tolerance but fatigued after 300' and very eager to return to walking her dog   Financial trader Rankin (Stroke Patients Only)       Balance Overall balance assessment: Needs assistance   Sitting balance-Leahy Scale: Good       Standing balance-Leahy Scale: Good                      Cognition Arousal/Alertness:  Awake/alert Behavior During Therapy: WFL for tasks assessed/performed Overall Cognitive Status: Within Functional Limits for tasks assessed                      Exercises General Exercises - Lower Extremity Ankle Circles/Pumps: AROM;20 reps;Both;Seated Long Arc Quad: AROM;Both;Seated;20 reps Hip ABduction/ADduction: AROM;20 reps;Both;Seated Hip Flexion/Marching: AROM;20 reps;Both;Seated    General Comments        Pertinent Vitals/Pain Pain Assessment: No/denies pain    Home Living                      Prior Function            PT Goals (current goals can now be found in the care plan section) Progress towards PT goals: Progressing toward goals    Frequency           PT Plan Current plan remains appropriate    Co-evaluation             End of Session Equipment Utilized During Treatment: Gait belt Activity Tolerance: Patient tolerated treatment well Patient left: in chair;with call bell/phone within reach;with chair alarm set     Time: 1829-9371 PT Time Calculation (min) (ACUTE ONLY): 17 min  Charges:  $Gait Training: 8-22 mins                    G Codes:  Derenda Giddings B Karis Emig 05/25/2016, 9:15 AM  Elwyn Reach, [place]

## 2016-05-25 NOTE — Progress Notes (Signed)
[facility] ADULT ICU REPLACEMENT PROTOCOL FOR AM LAB REPLACEMENT ONLY  The patient does apply for the [facility] Adult ICU Electrolyte Replacment Protocol based on the criteria listed below:   1. Is GFR >/= 40 ml/min? Yes.    Patient's GFR today is >60 2. Is urine output >/= 0.5 ml/kg/hr for the last 6 hours? Yes.   Patient's UOP is 0.7 ml/kg/hr 3. Is BUN < 60 mg/dL? Yes.    Patient's BUN today is <5 4. Abnormal electrolyte(s): k 2.9 5. Ordered repletion with: protocol 6. If a panic level lab has been reported, has the CCM MD in charge been notified? No..   Physician:    Ronda Fairly A 05/25/2016 5:56 AM

## 2016-05-25 NOTE — Progress Notes (Deleted)
15 mls versed wasted and witnessed by Allegra Grana RN

## 2016-05-25 NOTE — Progress Notes (Signed)
Daily Rounding Note  05/25/2016, 8:11 AM  LOS: 3 days   SUBJECTIVE:   Chief complaint: hungry    Tolerating [place] diet.  Stools are still liquid and dark, but not as red as thwy had been PTA.  Feels great.    OBJECTIVE:         Vital signs in last 24 hours:    Temp:  [97.8 F (36.6 C)-99.5 F (37.5 C)] 99.5 F (37.5 C) (02/12 0805) Pulse Rate:  [75-103] 88 (02/12 0700) Resp:  [17-26] 19 (02/12 0700) BP: (91-120)/(51-83) 113/58 (02/12 0700) SpO2:  [94 %-100 %] 96 % (02/12 0700) Weight:  [63.7 kg (140 lb 6.9 oz)] 63.7 kg (140 lb 6.9 oz) (02/12 0524) Last BM Date: 05/24/16 Filed Weights   05/22/16 2200 05/23/16 0027 05/25/16 0524  Weight: 61.5 kg (135 lb 9.3 oz) 61.3 kg (135 lb 2.3 oz) 63.7 kg (140 lb 6.9 oz)   General: pleasant, alert, comfortable   Heart: RRR Chest: clear bil.  Reduced BS overall.  No cough or dyspnea Abdomen: soft, active BS, NT.  Active BS  Extremities: no CCE Neuro/Psych:  Oriented x 3.  Calm.  In good spirits.   Intake/Output from previous day: 02/11 0701 - 02/12 0700 In: 1625 [I.V.:1575; IV Piggyback:50] Out: -   Intake/Output this shift: No intake/output data recorded.  Lab Results:  Recent Labs  05/23/16 0333 05/24/16 0522 05/25/16 0438  WBC 16.8* 14.2* 15.2*  HGB 12.0 8.8* 8.5*  HCT 34.1* 25.6* 25.2*  PLT 349 321 321   BMET  Recent Labs  05/23/16 0333 05/23/16 1352 05/24/16 0522 05/25/16 0438  NA 138  --  145 142  K 2.8* 3.2* 4.0 2.9*  CL 102  --  112* 109  CO2 23  --  24 25  GLUCOSE 146*  --  115* 96  BUN 35*  --  13 <5*  CREATININE 0.86  --  0.59 0.56  CALCIUM 8.2*  --  8.1* 7.9*   LFT  Recent Labs  05/22/16 1430 05/22/16 2130 05/23/16 0333  PROT QUESTIONABLE RESULTS, RECOMMEND RECOLLECT TO VERIFY 5.5* 5.3*  ALBUMIN QUESTIONABLE RESULTS, RECOMMEND RECOLLECT TO VERIFY 2.8* 2.7*  AST QUESTIONABLE RESULTS, RECOMMEND RECOLLECT TO VERIFY 36 34  ALT  QUESTIONABLE RESULTS, RECOMMEND RECOLLECT TO VERIFY 25 23  ALKPHOS QUESTIONABLE RESULTS, RECOMMEND RECOLLECT TO VERIFY 87 84  BILITOT QUESTIONABLE RESULTS, RECOMMEND RECOLLECT TO VERIFY 0.8 1.1   PT/INR  Recent Labs  05/22/16 1430  LABPROT 17.7*  INR 1.45   Hepatitis Panel No results for input(s): HEPBSAG, HCVAB, HEPAIGM, HEPBIGM in the last 72 hours.  Studies/Results: No results found.   Scheduled Meds: . cefTRIAXone (ROCEPHIN)  IV  2 g Intravenous Q24H  . Chlorhexidine Gluconate Cloth  6 each Topical Q0600  . folic acid  1 mg Intravenous Daily  . insulin aspart  0-15 Units Subcutaneous Q4H  . [START ON 05/26/2016] pantoprazole  40 mg Intravenous Q12H  . potassium chloride  40 mEq Oral Q4H  . thiamine injection  100 mg Intravenous Daily   Continuous Infusions: . sodium chloride 50 mL/hr at 05/25/16 0400  . pantoprozole (PROTONIX) infusion 8 mg/hr (05/25/16 0400)   PRN Meds:.albuterol, ondansetron (ZOFRAN) IV   ASSESMENT:   *  Hematemesis and melena.  Daily Naproxen PTA. .   EGD 2/10: large GU with VV.  Pan gastritis.   Awaiting path report, r/o H pylori.    *  ABL anemia.  S/P  PRBC x 2.  Hg stable over 24 hours, down ~3 grams overall from post transfusion apex.   *  Hypokalemia.    *  AKI.  Resolved.   *  Alcoholism.  Fatty liver per CT in 2014.  No evidence of liver dz sequelae on EGD. Platelets and coags normal.      PLAN   *  Completes PPI gtt today.  Start oral Protonix BID this evening. Advance to carb mod diet.  Await path report, treat if H Pylori +.  Pt aware and wants to stop ETOH as well as Naproxen.  Hgb and hct in AM.     Emily Cohen  05/25/2016, 8:11 AM Pager: [phone]

## 2016-05-26 ENCOUNTER — Inpatient Hospital Stay (HOSPITAL_COMMUNITY): Payer: Medicaid Other

## 2016-05-26 DIAGNOSIS — K250 Acute gastric ulcer with hemorrhage: Secondary | ICD-10-CM

## 2016-05-26 LAB — CBC
HCT: 31.3 % — ABNORMAL LOW (ref 36.0–46.0)
Hemoglobin: 10.3 g/dL — ABNORMAL LOW (ref 12.0–15.0)
MCH: 29.3 pg (ref 26.0–34.0)
MCHC: 32.9 g/dL (ref 30.0–36.0)
MCV: 88.9 fL (ref 78.0–100.0)
PLATELETS: 430 10*3/uL — AB (ref 150–400)
RBC: 3.52 MIL/uL — AB (ref 3.87–5.11)
RDW: 16.2 % — AB (ref 11.5–15.5)
WBC: 19.1 10*3/uL — AB (ref 4.0–10.5)

## 2016-05-26 LAB — GLUCOSE, CAPILLARY
Glucose-Capillary: 92 mg/dL (ref 65–99)
Glucose-Capillary: 98 mg/dL (ref 65–99)

## 2016-05-26 LAB — MAGNESIUM: MAGNESIUM: 1.2 mg/dL — AB (ref 1.7–2.4)

## 2016-05-26 LAB — COMPREHENSIVE METABOLIC PANEL
ALBUMIN: 2.8 g/dL — AB (ref 3.5–5.0)
ALT: 17 U/L (ref 14–54)
ANION GAP: 10 (ref 5–15)
AST: 17 U/L (ref 15–41)
Alkaline Phosphatase: 71 U/L (ref 38–126)
CHLORIDE: 111 mmol/L (ref 101–111)
CO2: 22 mmol/L (ref 22–32)
Calcium: 8.5 mg/dL — ABNORMAL LOW (ref 8.9–10.3)
Creatinine, Ser: 0.55 mg/dL (ref 0.44–1.00)
GFR calc Af Amer: >60 mL/min (ref >60)
GFR calc non Af Amer: >60 mL/min (ref >60)
GLUCOSE: 93 mg/dL (ref 65–99)
POTASSIUM: 4.4 mmol/L (ref 3.5–5.1)
SODIUM: 143 mmol/L (ref 135–145)
Total Bilirubin: 0.7 mg/dL (ref 0.3–1.2)
Total Protein: 5.4 g/dL — ABNORMAL LOW (ref 6.5–8.1)

## 2016-05-26 MED ORDER — PANTOPRAZOLE SODIUM 40 MG PO TBEC: 40.0000 mg | DELAYED_RELEASE_TABLET | Freq: Two times a day (BID) | ORAL | 1 refills | Status: DC

## 2016-05-26 NOTE — Care Management Note (Addendum)
Case Management Note  Patient Details  Name: Emily Cohen MRN: [id number] Date of Birth: 02/22/1951  Subjective/Objective:         Spoke to patient at the bedside. Patient stated that she would need a RW and BSC for home use. Ordered and requested to be delivered to room by [facility] today. Patient states that she has medicare as well, and would like to have [facility] provide HH PT. Manuela Schwartz, clinical liaison verifying medicare, if covered they can follow for [facility], if not will need to reasses if Dx covers [facility] PT through Medicaid.           Patient states she has HH 2.5 hours 5 days a week.   Action/Plan:  [facility] verifying Medicare, will DC to home with [facility] RN SW, if medicare covered will also have PT. Patient aware. RW and BSC at bedside to take home.  Expected Discharge Date:                  Expected Discharge Plan:     In-House Referral:     Discharge planning Services  CM Consult  Post Acute Care Choice:    Choice offered to:     DME Arranged:    DME Agency:     HH Arranged:    HH Agency:     Status of Service:  In process, will continue to follow  If discussed at Long Length of Stay Meetings, dates discussed:    Additional Comments:  Carles Collet, RN 05/26/2016, 11:25 AM

## 2016-05-26 NOTE — Discharge Summary (Signed)
[facility]  Physician Discharge Summary   Patient ID: Emily Cohen MRN: [id number] DOB/AGE: 1950/07/09 66 y.o.  Admit date: 05/22/2016 Discharge date: 05/26/2016  PCP: Default, Provider, MD  DISCHARGE DIAGNOSES:  Active Problems:   Acute upper GI bleed   Respiratory failure (HCC)   Acute post-hemorrhagic anemia   Alcohol abuse   RECOMMENDATIONS FOR OUTPATIENT FOLLOW UP:  Will need to follow-up with her primary care physician to have blood work done to check WBC count.  Will have to arrange for an cardiac event monitor to see if there are any episodes of atrial fibrillation  Will need to follow-up with Dr. Ninfa Linden with orthopedic surgery for left shoulder fracture.  Antihypertensives placed on hold due to borderline low blood pressures.   DISCHARGE CONDITION: fair  Diet recommendation: As before  Filed Weights   05/23/16 0027 05/25/16 0524 05/25/16 2022  Weight: 61.3 kg (135 lb 2.3 oz) 63.7 kg (140 lb 6.9 oz) 63.8 kg (140 lb 9.6 oz)    INITIAL HISTORY: 66 yo female who fell at home and was found by her roommate on the floor throwing up blood.  She has hx of ETOH abuse, w/ her last drink reportedly 5 days prior to admission.  In the ER she continued to have bleeding, and there was concern about her ability to protect her airway.  She was intubated and admitted to the ICU. She was stabilized.  Consultations:  Critical care medicine  Gastroenterology  Procedures: EGD Impression:               - Blood clot in the stomach. - Medium to large non-bleeding gastric ulcer with visible vessel. The ulcer was treated with injection of dilute epinephrine and then application of co-aptive cautery with 7French Gold probe. - Moderate pan-gastritis was biopsied to check for H. Pylori.  HOSPITAL COURSE:   Upper GI bleeding - large gastric ulcer Patient presented with upper GI bleed. She was admitted to the intensive care unit. She was intubated for airway protection.  Seen by gastroenterology. Initially placed on PPI as well as octreotide. Upper endoscopy showed gastric ulcer. No varices were noted. Octreotide was discontinued. Biopsies were taken. H. pylori is pending. Switched over to twice daily PPI.    Acute blood loss anemia Due to above. Hemoglobin has been stable.  Compromised airway Extubated 2/10 - on room air - no persisting respiratory issues   Acute metabolic encephalopathy Resolved  ETOH abuse  No evidence of withdrawal at this time   Questionable atrial fibrillation EKG done at the time of admission was read by the computer as atrial fibrillation. Close examination of the EKG shows that it is a poor quality EKG with artifacts. There are P waves seen before QRS complexes. All subsequent EKGs have shown sinus rhythm. We will recommend PCP to pursue event monitor.   History of essential hypertension Blood pressure actually noted to be borderline low. We'll hold her antihypertensive agents. She should follow-up with her primary care provider.  Left proximal humerus fracture  Patient was diagnosed with a proximal humerus fracture in October 2017. She has not followed up with orthopedic surgeon. X-ray repeated which shows similar findings with upper halves slightly more displacement. Discussed with Dr. Ninfa Linden with orthopedics. He recommends follow-up in his office this week. This was conveyed to the patient. Sling for now.  Leukocytosis Patient's WBC noted to be elevated. There is no source of infection. She is saturating normal. No respiratory symptoms. No urinary complaints. No diarrhea. Blood  cultures are negative. Reason for high WBC could be stress from acute illness.This should be followed up by patient's primary care provider. Labs should be repeated in the outpatient setting. Patient has been told that if she develops high fever, she should seek attention immediately.  Overall , stable. Okay for discharge home  today.   PERTINENT LABS:  The results of significant diagnostics from this hospitalization (including imaging, microbiology, ancillary and laboratory) are listed below for reference.    Microbiology: Recent Results (from the past 240 hour(s))  Blood culture (routine x 2)     Status: None (Preliminary result)   Collection Time: 05/22/16  2:30 PM  Result Value Ref Range Status   Specimen Description BLOOD LEFT WRIST  Final   Special Requests BOTTLES DRAWN AEROBIC ONLY 5CC  Final   Culture NO GROWTH 3 DAYS  Final   Report Status PENDING  Incomplete  MRSA PCR Screening     Status: None   Collection Time: 05/22/16  7:18 PM  Result Value Ref Range Status   MRSA by PCR NEGATIVE NEGATIVE Final    Comment:        The GeneXpert MRSA Assay (FDA approved for NASAL specimens only), is one component of a comprehensive MRSA colonization surveillance program. It is not intended to diagnose MRSA infection nor to guide or monitor treatment for MRSA infections.   Blood culture (routine x 2)     Status: None (Preliminary result)   Collection Time: 05/22/16  7:33 PM  Result Value Ref Range Status   Specimen Description BLOOD RIGHT ANTECUBITAL  Final   Special Requests BOTTLES DRAWN AEROBIC ONLY [facility]  Final   Culture NO GROWTH 3 DAYS  Final   Report Status PENDING  Incomplete     Labs: Basic Metabolic Panel:  Recent Labs Lab 05/22/16 2130 05/23/16 0333 05/23/16 1352 05/24/16 0522 05/25/16 0438 05/25/16 0930 05/26/16 0701  NA 138 138  --  145 142 142 143  K 2.5* 2.8* 3.2* 4.0 2.9* 3.3* 4.4  CL 100* 102  --  112* 109 111 111  CO2 20* 23  --  '24 25 25 22  '$ GLUCOSE 152* 146*  --  115* 96 116* 93  BUN 49* 35*  --  13 <5* <5* <5*  CREATININE 1.15* 0.86  --  0.59 0.56 0.58 0.55  CALCIUM 8.2* 8.2*  --  8.1* 7.9* 8.2* 8.5*  MG 1.5* 2.4  --  1.7  --   --  1.2*  PHOS 3.6 2.6  --  1.3*  --   --   --    Liver Function Tests:  Recent Labs Lab 05/22/16 1430 05/22/16 2130 05/23/16 0333  05/26/16 0701  AST QUESTIONABLE RESULTS, RECOMMEND RECOLLECT TO VERIFY 36 34 17  ALT QUESTIONABLE RESULTS, RECOMMEND RECOLLECT TO VERIFY '25 23 17  '$ ALKPHOS QUESTIONABLE RESULTS, RECOMMEND RECOLLECT TO VERIFY 87 84 71  BILITOT QUESTIONABLE RESULTS, RECOMMEND RECOLLECT TO VERIFY 0.8 1.1 0.7  PROT QUESTIONABLE RESULTS, RECOMMEND RECOLLECT TO VERIFY 5.5* 5.3* 5.4*  ALBUMIN QUESTIONABLE RESULTS, RECOMMEND RECOLLECT TO VERIFY 2.8* 2.7* 2.8*   No results for input(s): LIPASE, AMYLASE in the last 168 hours.  Recent Labs Lab 05/22/16 2022  AMMONIA 31   CBC:  Recent Labs Lab 05/22/16 2347 05/23/16 0333 05/24/16 0522 05/25/16 0438 05/26/16 0701  WBC 22.4* 16.8* 14.2* 15.2* 19.1*  HGB 12.3 12.0 8.8* 8.5* 10.3*  HCT 34.4* 34.1* 25.6* 25.2* 31.3*  MCV 84.5 84.0 86.8 88.1 88.9  PLT 356 349 321 321  430*   Cardiac Enzymes:  Recent Labs Lab 05/22/16 1430 05/22/16 2130  CKTOTAL 56 183   CBG:  Recent Labs Lab 05/25/16 1143 05/25/16 1541 05/25/16 2028 05/26/16 0745 05/26/16 1150  GLUCAP 91 104* 93 92 98     IMAGING STUDIES Dg Chest Port 1 View  Result Date: 05/23/2016 CLINICAL DATA:  Respiratory failure EXAM: PORTABLE CHEST 1 VIEW COMPARISON:  05/22/2016 FINDINGS: Endotracheal tube, right central line and NG tube remain in place, unchanged. Minimal left base atelectasis. Right lung is clear. Heart is normal size. No effusions. IMPRESSION: Minimal left base atelectasis. Electronically Signed   By: Rolm Baptise M.D.   On: 05/23/2016 07:10   Dg Chest Port 1 View  Result Date: 05/22/2016 CLINICAL DATA:  Respiratory failure EXAM: PORTABLE CHEST 1 VIEW COMPARISON:  09/22/2015 FINDINGS: Endotracheal tube tip is approximately 2.4 cm superior to the carina. Esophageal tube tip is below the diaphragm but is not included. Right-sided central venous catheter tip overlies the mid SVC. There is no right pneumothorax. No acute infiltrate or effusion.  Normal heart size. IMPRESSION: 1. Support  lines and tubes as above 2. Clear lung fields Electronically Signed   By: Donavan Foil M.D.   On: 05/22/2016 18:08   Dg Shoulder Left  Result Date: 05/26/2016 CLINICAL DATA:  Multiple falls.  Old left humeral fracture EXAM: LEFT SHOULDER - 2+ VIEW COMPARISON:  01/24/2016 FINDINGS: Comminuted, displaced fracture through the proximal left humeral neck, old. The shaft is displaced significantly anteriorly relative to the humeral head and neck. Degree of displacement may have increased since prior study. IMPRESSION: Old left proximal left humerus fracture with marked anterior displacement of the shaft relative to the humeral head and neck. Degree of displacement may have increased since prior study. Electronically Signed   By: Rolm Baptise M.D.   On: 05/26/2016 09:33   Dg Abd Portable 1v  Result Date: 05/22/2016 CLINICAL DATA:  OG tube placement EXAM: PORTABLE ABDOMEN - 1 VIEW COMPARISON:  05/22/2016 FINDINGS: Partially visualized right-sided central venous catheter tip superimposes the distal SVC. Lung bases grossly clear. Esophageal tube tip overlies the distal stomach, side-port overlies the mid stomach. Upper intestinal gas pattern is grossly unremarkable. IMPRESSION: Esophageal tube tip overlies the distal stomach Electronically Signed   By: Donavan Foil M.D.   On: 05/22/2016 20:13   Dg Abd Portable 1 View  Result Date: 05/22/2016 CLINICAL DATA:  NG tube placement EXAM: PORTABLE ABDOMEN - 1 VIEW COMPARISON:  None. FINDINGS: 1600 hours. NG tube tip is in the region the pylorus in may be transpyloric into the proximal duodenum. Bowel gas pattern nonspecific. Telemetry leads overlie the chest. IMPRESSION: NG tube tip is at or potentially distal to the pylorus. Tube could be retracted about 5 cm for tip placement in the distal stomach. Electronically Signed   By: Misty Stanley M.D.   On: 05/22/2016 16:14    DISCHARGE EXAMINATION: Vitals:   05/25/16 1800 05/25/16 2022 05/26/16 0549 05/26/16 1000  BP:  103/80  (!) 104/54 (!) 113/59  Pulse:  95 94 96  Resp: '18 18 18 20  '$ Temp:  99.2 F (37.3 C) 99.2 F (37.3 C) 99 F (37.2 C)  TempSrc:  Oral Oral Oral  SpO2:  100% 100% 100%  Weight:  63.8 kg (140 lb 9.6 oz)    Height:  '5\' 5"'$  (1.651 m)     General appearance: alert, cooperative, appears stated age and no distress Resp: clear to auscultation bilaterally Cardio: regular rate and rhythm,  S1, S2 normal, no murmur, click, rub or gallop GI: soft, non-tender; bowel sounds normal; no masses,  no organomegaly Extremities: Limited range of motion of the left shoulder. No erythema noted.  DISPOSITION: Home. Home health  Discharge Instructions    Call MD for:  difficulty breathing, headache or visual disturbances    Complete by:  As directed    Call MD for:  extreme fatigue    Complete by:  As directed    Call MD for:  persistant dizziness or light-headedness    Complete by:  As directed    Call MD for:  persistant nausea and vomiting    Complete by:  As directed    Call MD for:  severe uncontrolled pain    Complete by:  As directed    Call MD for:  temperature >100.4    Complete by:  As directed    Discharge instructions    Complete by:  As directed    You will need to follow-up with her primary care physician to have blood work done to check your WBC count. If you develop high fevers in the meantime, please seek attention immediately. Your PCP will have to arrange for an cardiac event monitor to see if you are having irregular heartbeats. You will need to follow-up with Dr. Ninfa Linden with orthopedic surgery for left shoulder fracture. Do not take Motrin, Naproxen and any other NSAIDS for now.  You were cared for by a hospitalist during your hospital stay. If you have any questions about your discharge medications or the care you received while you were in the hospital after you are discharged, you can call the unit and asked to speak with the hospitalist on call if the hospitalist that took  care of you is not available. Once you are discharged, your primary care physician will handle any further medical issues. Please note that NO REFILLS for any discharge medications will be authorized once you are discharged, as it is imperative that you return to your primary care physician (or establish a relationship with a primary care physician if you do not have one) for your aftercare needs so that they can reassess your need for medications and monitor your lab values. If you do not have a primary care physician, you can call [phone] for a physician referral.   Increase activity slowly    Complete by:  As directed       ALLERGIES: No Known Allergies   Current Discharge Medication List    START taking these medications   Details  pantoprazole (PROTONIX) 40 MG tablet Take 1 tablet (40 mg total) by mouth 2 (two) times daily. Qty: 60 tablet, Refills: 1      CONTINUE these medications which have NOT CHANGED   Details  acetaminophen (TYLENOL) 500 MG tablet Take 500-1,000 mg by mouth every 6 (six) hours as needed for headache.    oxyCODONE-acetaminophen (PERCOCET/ROXICET) 5-325 MG tablet Take 1 tablet by mouth 4 (four) times daily.    lovastatin (MEVACOR) 40 MG tablet Take 40 mg by mouth at bedtime.    ondansetron (ZOFRAN) 4 MG tablet Take 4 mg by mouth See admin instructions. One to three times a day as needed for nausea/vomiting    OVER THE COUNTER MEDICATION Take 3 tablets by mouth at bedtime. Over the Counter sleep aid -- patient takes 3 tablets every night    promethazine (PHENERGAN) 25 MG tablet Take 1 tablet (25 mg total) by mouth every 6 (six) hours as needed  for nausea. Qty: 20 tablet, Refills: 0    venlafaxine XR (EFFEXOR-XR) 75 MG 24 hr capsule Take 75 mg by mouth daily with breakfast.      STOP taking these medications     amLODipine (NORVASC) 10 MG tablet      aspirin EC 81 MG tablet      hydrochlorothiazide (HYDRODIURIL) 25 MG tablet      ibuprofen  (ADVIL,MOTRIN) 200 MG tablet      naproxen (NAPROSYN) 500 MG tablet          Follow-up Information    Milus Banister, MD. Schedule an appointment as soon as possible for a visit in 3 week(s).   Specialty:  Gastroenterology Contact information: [address] [place] [address] [phone]        OSEI-BONSU,GEORGE, MD. Schedule an appointment as soon as possible for a visit in 3 day(s).   Specialty:  Internal Medicine Why:  You will need to have blood work to check your white blood cell count Contact information: [address] [place] [address] [phone]        [facility] Follow up.   Why:  Rolling walker and bedside commode to be delivered to room prior to discharge Contact information: [address] [address] [phone]        Mcarthur Rossetti, MD Follow up.   Specialty:  Orthopedic Surgery Why:  Please call his office tomorrow to schedule appointment. Tell him that Dr. [place] Pink poke to Dr. Ninfa Linden on 2/13. Contact information: [place] [address] [phone]           TOTAL DISCHARGE TIME: 35 minutes  [facility] Pager [phone]  05/26/2016, 2:38 PM

## 2016-05-26 NOTE — Progress Notes (Signed)
Orthopedic Tech Progress Note Patient Details:  Emily Cohen 07/16/1950 [id number]  Ortho Devices Type of Ortho Device: Sling immobilizer Ortho Device/Splint Location: lue Ortho Device/Splint Interventions: Application   Christepher Melchior 05/26/2016, 9:10 AM

## 2016-05-26 NOTE — Discharge Instructions (Signed)
Gastrointestinal Bleeding Introduction Gastrointestinal bleeding is bleeding somewhere along the path food travels through the body (digestive tract). This path is anywhere between the mouth and the opening of the butt (anus). You may have blood in your poop (stools) or have black poop. If you throw up (vomit), there may be blood in it. This condition can be mild, serious, or even life-threatening. If you have a lot of bleeding, you may need to stay in the hospital. Follow these instructions at home:  Take over-the-counter and prescription medicines only as told by your doctor.  Eat foods that have a lot of fiber in them. These foods include whole grains, fruits, and vegetables. You can also try eating 1-3 prunes each day.  Drink enough fluid to keep your pee (urine) clear or pale yellow.  Keep all follow-up visits as told by your doctor. This is important. Contact a doctor if:  Your symptoms do not get better. Get help right away if:  Your bleeding gets worse.  You feel dizzy or you pass out (faint).  You feel weak.  You have very bad cramps in your back or belly (abdomen).  You pass large clumps of blood (clots) in your poop.  Your symptoms are getting worse. This information is not intended to replace advice given to you by your health care provider. Make sure you discuss any questions you have with your health care provider. Document Released: 01/07/2008 Document Revised: 09/05/2015 Document Reviewed: 09/17/2014  2017 Elsevier   Peptic Ulcer A peptic ulcer is a painful sore in the lining of your esophagus, stomach, or in the first part of your small intestine. The main causes of an ulcer can be:  An infection.  Using certain pain medicines too often or too much.  Smoking. HOME CARE  Avoid smoking, alcohol, and caffeine.  Avoid foods that bother you.  Only take medicine as told by your doctor. Do not take any medicines your doctor has not approved.  Keep all  doctor visits as told. GET HELP IF:  You do not get better in 7 days after starting treatment.  You keep having an upset stomach (indigestion) or heartburn. GET HELP RIGHT AWAY IF:  You have sudden, sharp, or lasting belly (abdominal) pain.  You have bloody, black, or tarry poop (stool).  You throw up (vomit) blood or your throw up looks like coffee grounds.  You get light-headed, weak, or feel like you will pass out (faint).  You get sweaty or feel sticky and cold to the touch (clammy). MAKE SURE YOU:   Understand these instructions.  Will watch your condition.  Will get help right away if you are not doing well or get worse. This information is not intended to replace advice given to you by your health care provider. Make sure you discuss any questions you have with your health care provider. Document Released: 06/24/2009 Document Revised: 04/20/2014 Document Reviewed: 12/29/2014 Elsevier Interactive Patient Education  2017 [employer].  Humerus Fracture Treated With Immobilization Introduction The humerus is the large bone in the upper arm. A broken (fractured) humerus is often treated by wearing a cast, splint, or sling (immobilization). This holds the broken pieces in place so they can heal. Follow these instructions at home: If you have a cast:  Do not stick anything inside the cast to scratch your skin.  Check the skin around the cast every day. Tell your doctor about any concerns. You may put lotion on dry skin around the edges of the  cast. Do not put lotion on the skin underneath the cast.  Keep the cast clean and dry as told by your doctor. If you have a splint:  Wear it as told by your doctor. Remove it only as told by your doctor.  Loosen the splint if your fingers become numb and tingle, or if they turn cold and blue.  Keep the splint clean and dry. If you have a sling:  Wear it as told by your doctor. Remove it only as told by your  doctor. Bathing  Do not take baths, swim, or use a hot tub until your doctor says that you can. Ask your doctor if you can take showers. You may only be allowed to take sponge baths.  If your cast or splint is not waterproof, cover it with a watertight plastic bag while you take a bath or a shower. Do not let the cast or splint get wet.  If you have a sling, remove it for bathing only if your doctor says this is okay. Managing pain, stiffness, and swelling  If directed, put ice on the injured area.  Put ice in a plastic bag.  Place a towel between your skin and the bag.  Leave the ice on for 20 minutes, 2-3 times per day.  Move your fingers often to avoid stiffness and to lessen swelling.  Raise (elevate) the injured area above the level of your heart while you are sitting or lying down. Driving  Do not drive or use heavy machinery while taking prescription pain medicine.  Do not drive while wearing a cast, splint, or sling on an arm that you use for driving. Activity  Return to your normal activities as told by your doctor. Ask your doctor what activities are safe for you.  Do range-of-motion exercises only as told by your doctor. General instructions  Do not put pressure on any part of the cast or splint until it is fully hardened. This may take many hours.  Do not use any tobacco products. These include cigarettes, chewing tobacco, or e-cigarettes. Tobacco can delay bone healing. If you need help quitting, ask your doctor.  Take over-the-counter and prescription medicines only as told by your doctor.  Keep all follow-up visits as told by your doctor. This is important. Contact a doctor if:  You have any new pain, swelling, or bruising.  Your pain, swelling, and bruising do not get better.  Your cast, splint, or sling becomes loose or damaged. Get help right away if:  Your skin or fingers on your injured arm turn blue or gray.  Your arm is cold or numb.  You  have very bad pain in your injured arm. This information is not intended to replace advice given to you by your health care provider. Make sure you discuss any questions you have with your health care provider. Document Released: 09/16/2007 Document Revised: 09/05/2015 Document Reviewed: 08/22/2014  2017 Elsevier

## 2016-05-27 LAB — CULTURE, BLOOD (ROUTINE X 2)
CULTURE: NO GROWTH
Culture: NO GROWTH

## 2016-05-27 LAB — GLUCOSE, CAPILLARY: GLUCOSE-CAPILLARY: 135 mg/dL — AB (ref 65–99)

## 2016-05-27 LAB — HEMOGLOBIN A1C
Hgb A1c MFr Bld: 5.3 % (ref 4.8–5.6)
Mean Plasma Glucose: 105 mg/dL

## 2016-06-10 ENCOUNTER — Ambulatory Visit (INDEPENDENT_AMBULATORY_CARE_PROVIDER_SITE_OTHER): Payer: Self-pay | Admitting: Orthopaedic Surgery

## 2016-08-24 ENCOUNTER — Observation Stay (HOSPITAL_COMMUNITY)
Admission: EM | Admit: 2016-08-24 | Discharge: 2016-08-25 | Disposition: A | Discharge status: Home / Self Care | Payer: Medicare Other | Attending: Internal Medicine | Admitting: Internal Medicine

## 2016-08-24 ENCOUNTER — Encounter (HOSPITAL_COMMUNITY): Payer: Self-pay | Admitting: Emergency Medicine

## 2016-08-24 DIAGNOSIS — R197 Diarrhea, unspecified: Secondary | ICD-10-CM | POA: Diagnosis present

## 2016-08-24 DIAGNOSIS — R748 Abnormal levels of other serum enzymes: Secondary | ICD-10-CM | POA: Insufficient documentation

## 2016-08-24 DIAGNOSIS — M19012 Primary osteoarthritis, left shoulder: Secondary | ICD-10-CM | POA: Insufficient documentation

## 2016-08-24 DIAGNOSIS — E119 Type 2 diabetes mellitus without complications: Secondary | ICD-10-CM | POA: Insufficient documentation

## 2016-08-24 DIAGNOSIS — F329 Major depressive disorder, single episode, unspecified: Secondary | ICD-10-CM | POA: Insufficient documentation

## 2016-08-24 DIAGNOSIS — R079 Chest pain, unspecified: Secondary | ICD-10-CM | POA: Diagnosis not present

## 2016-08-24 DIAGNOSIS — Z720 Tobacco use: Secondary | ICD-10-CM | POA: Diagnosis not present

## 2016-08-24 DIAGNOSIS — R7989 Other specified abnormal findings of blood chemistry: Secondary | ICD-10-CM

## 2016-08-24 DIAGNOSIS — Z853 Personal history of malignant neoplasm of breast: Secondary | ICD-10-CM | POA: Diagnosis not present

## 2016-08-24 DIAGNOSIS — Z79899 Other long term (current) drug therapy: Secondary | ICD-10-CM | POA: Insufficient documentation

## 2016-08-24 DIAGNOSIS — I10 Essential (primary) hypertension: Secondary | ICD-10-CM | POA: Insufficient documentation

## 2016-08-24 DIAGNOSIS — E785 Hyperlipidemia, unspecified: Secondary | ICD-10-CM | POA: Insufficient documentation

## 2016-08-24 DIAGNOSIS — R0789 Other chest pain: Principal | ICD-10-CM | POA: Insufficient documentation

## 2016-08-24 DIAGNOSIS — F17210 Nicotine dependence, cigarettes, uncomplicated: Secondary | ICD-10-CM | POA: Diagnosis not present

## 2016-08-24 DIAGNOSIS — Z7952 Long term (current) use of systemic steroids: Secondary | ICD-10-CM | POA: Diagnosis not present

## 2016-08-24 DIAGNOSIS — R778 Other specified abnormalities of plasma proteins: Secondary | ICD-10-CM

## 2016-08-24 LAB — POC OCCULT BLOOD, ED: FECAL OCCULT BLD: NEGATIVE

## 2016-08-24 LAB — TROPONIN I
TROPONIN I: 0.03 ng/mL — AB (ref <0.03)
TROPONIN I: 0.03 ng/mL — AB (ref <0.03)
TROPONIN I: 0.03 ng/mL — AB (ref <0.03)
TROPONIN I: 0.03 ng/mL — AB (ref <0.03)

## 2016-08-24 LAB — CBC WITH DIFFERENTIAL/PLATELET
Basophils Absolute: 0 10*3/uL (ref 0.0–0.1)
Basophils Relative: 0 %
EOS PCT: 0 %
Eosinophils Absolute: 0.1 10*3/uL (ref 0.0–0.7)
HCT: 33.4 % — ABNORMAL LOW (ref 36.0–46.0)
Hemoglobin: 11.2 g/dL — ABNORMAL LOW (ref 12.0–15.0)
LYMPHS ABS: 1.8 10*3/uL (ref 0.7–4.0)
LYMPHS PCT: 11 %
MCH: 25.4 pg — AB (ref 26.0–34.0)
MCHC: 33.5 g/dL (ref 30.0–36.0)
MCV: 75.7 fL — AB (ref 78.0–100.0)
MONO ABS: 0.5 10*3/uL (ref 0.1–1.0)
MONOS PCT: 3 %
Neutro Abs: 13.7 10*3/uL — ABNORMAL HIGH (ref 1.7–7.7)
Neutrophils Relative %: 86 %
PLATELETS: 417 10*3/uL — AB (ref 150–400)
RBC: 4.41 MIL/uL (ref 3.87–5.11)
RDW: 17.2 % — AB (ref 11.5–15.5)
WBC: 16.1 10*3/uL — AB (ref 4.0–10.5)

## 2016-08-24 LAB — TYPE AND SCREEN
ABO/RH(D): B POS
Antibody Screen: NEGATIVE

## 2016-08-24 LAB — COMPREHENSIVE METABOLIC PANEL
ALT: 24 U/L (ref 14–54)
ANION GAP: 9 (ref 5–15)
AST: 23 U/L (ref 15–41)
Albumin: 3.5 g/dL (ref 3.5–5.0)
Alkaline Phosphatase: 99 U/L (ref 38–126)
BUN: 15 mg/dL (ref 6–20)
CHLORIDE: 102 mmol/L (ref 101–111)
CO2: 28 mmol/L (ref 22–32)
CREATININE: 0.69 mg/dL (ref 0.44–1.00)
Calcium: 9.2 mg/dL (ref 8.9–10.3)
Glucose, Bld: 105 mg/dL — ABNORMAL HIGH (ref 65–99)
POTASSIUM: 3.4 mmol/L — AB (ref 3.5–5.1)
SODIUM: 139 mmol/L (ref 135–145)
Total Bilirubin: 0.5 mg/dL (ref 0.3–1.2)
Total Protein: 6.8 g/dL (ref 6.5–8.1)

## 2016-08-24 LAB — ABO/RH: ABO/RH(D): B POS

## 2016-08-24 LAB — PROTIME-INR
INR: 0.96
Prothrombin Time: 12.8 seconds (ref 11.4–15.2)

## 2016-08-24 MED ORDER — ONDANSETRON HCL 4 MG PO TABS: 4.0000 mg | ORAL_TABLET | Freq: Three times a day (TID) | ORAL | Status: DC | PRN

## 2016-08-24 MED ORDER — CYCLOBENZAPRINE HCL 10 MG PO TABS
5.0000 mg | ORAL_TABLET | Freq: Once | ORAL | Status: AC
  Administered 2016-08-24: 5 mg via ORAL
  Filled 2016-08-24: qty 1

## 2016-08-24 MED ORDER — SODIUM CHLORIDE 0.9% FLUSH: 3.0000 mL | Freq: Two times a day (BID) | INTRAVENOUS | Status: DC

## 2016-08-24 MED ORDER — PROMETHAZINE HCL 25 MG PO TABS: 25.0000 mg | ORAL_TABLET | Freq: Four times a day (QID) | ORAL | Status: DC | PRN

## 2016-08-24 MED ORDER — CYCLOBENZAPRINE HCL 5 MG PO TABS: 5.0000 mg | ORAL_TABLET | Freq: Three times a day (TID) | ORAL | Status: DC | PRN

## 2016-08-24 MED ORDER — NICOTINE 14 MG/24HR TD PT24
14.0000 mg | MEDICATED_PATCH | Freq: Every day | TRANSDERMAL | Status: DC
  Administered 2016-08-24 – 2016-08-25 (×2): 14 mg via TRANSDERMAL
  Filled 2016-08-24 (×2): qty 1

## 2016-08-24 MED ORDER — SODIUM CHLORIDE 0.9% FLUSH
3.0000 mL | Freq: Two times a day (BID) | INTRAVENOUS | Status: DC
  Administered 2016-08-24 – 2016-08-25 (×2): 3 mL via INTRAVENOUS

## 2016-08-24 MED ORDER — TRAMADOL HCL 50 MG PO TABS
50.0000 mg | ORAL_TABLET | Freq: Three times a day (TID) | ORAL | Status: DC
  Administered 2016-08-24 – 2016-08-25 (×3): 50 mg via ORAL
  Filled 2016-08-24 (×3): qty 1

## 2016-08-24 MED ORDER — SODIUM CHLORIDE 0.9 % IV SOLN: 250.0000 mL | INTRAVENOUS | Status: DC | PRN

## 2016-08-24 MED ORDER — ACETAMINOPHEN 325 MG PO TABS: 650.0000 mg | ORAL_TABLET | Freq: Four times a day (QID) | ORAL | Status: DC | PRN

## 2016-08-24 MED ORDER — SODIUM CHLORIDE 0.9% FLUSH: 3.0000 mL | INTRAVENOUS | Status: DC | PRN

## 2016-08-24 MED ORDER — PRAVASTATIN SODIUM 20 MG PO TABS: 10.0000 mg | ORAL_TABLET | Freq: Every day | ORAL | Status: DC

## 2016-08-24 MED ORDER — TRAZODONE HCL 50 MG PO TABS
50.0000 mg | ORAL_TABLET | Freq: Every day | ORAL | Status: DC
  Administered 2016-08-24: 50 mg via ORAL
  Filled 2016-08-24: qty 1

## 2016-08-24 MED ORDER — ACETAMINOPHEN 650 MG RE SUPP: 650.0000 mg | Freq: Four times a day (QID) | RECTAL | Status: DC | PRN

## 2016-08-24 MED ORDER — ONDANSETRON HCL 4 MG PO TABS: 4.0000 mg | ORAL_TABLET | Freq: Four times a day (QID) | ORAL | Status: DC | PRN

## 2016-08-24 MED ORDER — SODIUM CHLORIDE 0.9 % IV BOLUS (SEPSIS)
1000.0000 mL | Freq: Once | INTRAVENOUS | Status: AC
  Administered 2016-08-24: 1000 mL via INTRAVENOUS

## 2016-08-24 MED ORDER — DICLOFENAC SODIUM 1 % TD GEL
2.0000 g | Freq: Four times a day (QID) | TRANSDERMAL | Status: DC
Start: 2016-08-24 — End: 2016-08-25
  Administered 2016-08-24 – 2016-08-25 (×3): 2 g via TOPICAL
  Filled 2016-08-24: qty 100

## 2016-08-24 MED ORDER — ONDANSETRON HCL 4 MG/2ML IJ SOLN: 4.0000 mg | Freq: Four times a day (QID) | INTRAMUSCULAR | Status: DC | PRN

## 2016-08-24 MED ORDER — AMLODIPINE BESYLATE 10 MG PO TABS
10.0000 mg | ORAL_TABLET | Freq: Every day | ORAL | Status: DC
  Administered 2016-08-25: 10 mg via ORAL
  Filled 2016-08-24: qty 1

## 2016-08-24 MED ORDER — VENLAFAXINE HCL ER 75 MG PO CP24
75.0000 mg | ORAL_CAPSULE | Freq: Every day | ORAL | Status: DC
  Administered 2016-08-25: 75 mg via ORAL
  Filled 2016-08-24: qty 1

## 2016-08-24 NOTE — H&P (Signed)
History and Physical    Emily Cohen ZYS:[id number] DOB: 1951-03-25 DOA: 08/24/2016  PCP: Benito Mccreedy, MD   Patient coming from: Home    Chief Complaint: chest pain  HPI: Emily Cohen is a 66 y.o. female with medical history significant of tobacco abuse and hypertension. Patient presents with chest pain, localized in the left upper chest, dull in nature, 8 out of 10 in intensity, persistent in nature, worse with movement, improved with muscle relaxants in the emergency department. Patient sustained a mechanical fall about 4 months ago, since then she has been experiencing intermittent left shoulder pain that has aggravated over last week no been constant and worsening. Denies any frank angina, PND orthopnea. She continues to smoke one pack of cigarettes daily for many years.  ED Course: Pain control with flexeril, troponin elevation, called for admission.   Review of Systems:  1. Gen. no fevers or chills 2. ENT no runny nose or sore throat 3. Pulmonary no shortness of breath cough or hemoptysis 4. Cardiovascular, no angina or claudication 5. Gastrointestinal no nausea vomiting or diarrhea 6. Skeletal positive for left shoulder pain as mentioned in history present illness 7. Dermatology no rashes 8. Urology no dysuria or increased urinary frequency 9. Hematology no easy bruisability or frequent infections 10. Neurology no seizures or paresthesias  Past Medical History:  Diagnosis Date  . Alcohol use   . Arthritis   . Blindness   . Breast cancer, left breast ([place])   . Cancer ([place])   . Diabetes mellitus   . Hypertension     Past Surgical History:  Procedure Laterality Date  . ESOPHAGOGASTRODUODENOSCOPY N/A 05/23/2016   Procedure: ESOPHAGOGASTRODUODENOSCOPY (EGD);  Surgeon: Milus Banister, MD;  Location: [facility];  Service: Endoscopy;  Laterality: N/A;     reports that she has been smoking.  She has never used smokeless tobacco. She reports that she  drinks alcohol. She reports that she does not use drugs.  No Known Allergies  History reviewed. No pertinent family history. No family history of heart disease.   Prior to Admission medications   Medication Sig Start Date End Date Taking? Authorizing Provider  amLODipine (NORVASC) 10 MG tablet Take 10 mg by mouth daily.   Yes [provider]  lovastatin (MEVACOR) 20 MG tablet Take 20 mg by mouth daily.   Yes [provider]  ondansetron (ZOFRAN) 4 MG tablet Take 4 mg by mouth 3 (three) times daily as needed for nausea or vomiting.    Yes [provider]  predniSONE (DELTASONE) 20 MG tablet Take 20 mg by mouth daily with breakfast.   Yes [provider]  promethazine (PHENERGAN) 25 MG tablet Take 1 tablet (25 mg total) by mouth every 6 (six) hours as needed for nausea. 05/23/12  Yes Davonna Belling, MD  traMADol (ULTRAM) 50 MG tablet Take 50 mg by mouth 3 (three) times daily.   Yes [provider]  traZODone (DESYREL) 50 MG tablet Take 50 mg by mouth at bedtime.   Yes [provider]  venlafaxine XR (EFFEXOR-XR) 75 MG 24 hr capsule Take 75 mg by mouth daily with breakfast.   Yes [provider]    Physical Exam: Vitals:   08/24/16 0846 08/24/16 0849 08/24/16 1138 08/24/16 1335  BP:  119/65 136/69 (!) 149/76  Pulse:  87 79 83  Resp:  '16 15 17  '$ Temp:  97.8 F (36.6 C)    TempSrc:  Oral    SpO2: 98% 98% 97%  98%    Constitutional: deconditioned Vitals:   08/24/16 0846 08/24/16 0849 08/24/16 1138 08/24/16 1335  BP:  119/65 136/69 (!) 149/76  Pulse:  87 79 83  Resp:  '16 15 17  '$ Temp:  97.8 F (36.6 C)    TempSrc:  Oral    SpO2: 98% 98% 97% 98%   Eyes: PERRL, lids and conjunctivae normal, no icterus, mild pallor.  ENMT: Mucous membranes are moist. Posterior pharynx clear of any exudate or lesions.Normal dentition.  Neck: normal, supple, no masses, no thyromegaly Respiratory: clear to auscultation bilaterally, no  wheezing, no crackles. Normal respiratory effort. No accessory muscle use.  Cardiovascular: Regular rate and rhythm, no murmurs / rubs / gallops. Right lower extremity + non pitting edema. 2+ pedal pulses. No carotid bruits.  Abdomen: no tenderness, no masses palpated. No hepatosplenomegaly. Bowel sounds positive.  Musculoskeletal: no clubbing / cyanosis. No joint deformity upper and lower extremities. Good ROM, no contractures. Normal muscle tone. Patient unable to elevate her left hand over her head due to significant pain on the left shoulder. Reproducible pain up on palpation left shoulder. Skin: no rashes, lesions, ulcers. No induration Neurologic: CN 2-12 grossly intact. Sensation intact, DTR normal. Strength 5/5 in all 4.   Labs on Admission: I have personally reviewed following labs and imaging studies  CBC:  Recent Labs Lab 08/24/16 0950  WBC 16.1*  NEUTROABS 13.7*  HGB 11.2*  HCT 33.4*  MCV 75.7*  PLT 630*   Basic Metabolic Panel:  Recent Labs Lab 08/24/16 0950  NA 139  K 3.4*  CL 102  CO2 28  GLUCOSE 105*  BUN 15  CREATININE 0.69  CALCIUM 9.2   GFR: CrCl cannot be calculated (Unknown ideal weight.). Liver Function Tests:  Recent Labs Lab 08/24/16 0950  AST 23  ALT 24  ALKPHOS 99  BILITOT 0.5  PROT 6.8  ALBUMIN 3.5   No results for input(s): LIPASE, AMYLASE in the last 168 hours. No results for input(s): AMMONIA in the last 168 hours. Coagulation Profile:  Recent Labs Lab 08/24/16 0950  INR 0.96   Cardiac Enzymes:  Recent Labs Lab 08/24/16 1113 08/24/16 1228  TROPONINI 0.03* 0.03*   BNP (last 3 results) No results for input(s): PROBNP in the last 8760 hours. HbA1C: No results for input(s): HGBA1C in the last 72 hours. CBG: No results for input(s): GLUCAP in the last 168 hours. Lipid Profile: No results for input(s): CHOL, HDL, LDLCALC, TRIG, CHOLHDL, LDLDIRECT in the last 72 hours. Thyroid Function Tests: No results for input(s):  TSH, T4TOTAL, FREET4, T3FREE, THYROIDAB in the last 72 hours. Anemia Panel: No results for input(s): VITAMINB12, FOLATE, FERRITIN, TIBC, IRON, RETICCTPCT in the last 72 hours. Urine analysis:    Component Value Date/Time   COLORURINE YELLOW 05/22/2016 2120   APPEARANCEUR CLEAR 05/22/2016 2120   LABSPEC 1.013 05/22/2016 2120   PHURINE 5.0 05/22/2016 2120   GLUCOSEU NEGATIVE 05/22/2016 2120   HGBUR NEGATIVE 05/22/2016 2120   [place] NEGATIVE 05/22/2016 2120   [place] NEGATIVE 05/22/2016 2120   PROTEINUR NEGATIVE 05/22/2016 2120   UROBILINOGEN 0.2 02/20/2013 1609   NITRITE NEGATIVE 05/22/2016 2120   LEUKOCYTESUR NEGATIVE 05/22/2016 2120    Radiological Exams on Admission: No results found.  EKG: Independently reviewed. Normal sinus rhythm, left axis deviation, no ST elevations or depressions, no significant T wave abnormalities.  Assessment/Plan Active Problems:   Chest pain   This is a 66 year old female with significant history for hypertension and tobacco abuse. Presents with chest  pain, left upper chest which is nonexertional but related to movement of the shoulder joint. Positive trauma about 4 months ago, improved with Flexeril. On the physical examination blood pressure 149/76, heart rate 83, respiratory 17, oxygen saturation 90%. Her oral mucosa is moist, her lungs are clear to auscultation, heart S1-S2 present rhythmic, abdomen soft nontender, right leg nonpitting edema +. Pain is reproducible up on palpation of the left chest wall and passive movement of the left shoulder. Sodium 139, potassium 3.4, chloride 102, bicarbonate 28, glucose 105, BUN 15, creatinine 0.69, white count 16.1, hemoglobin 11.2, hematocrit 33.4, platelets 417, troponin 0.03.  Working diagnosis atypical chest pain to rule out acute coronary syndrome.  1. Atypical chest pain to rule out acute current syndrome. Will admit patient to the medical floor with a remote telemetry monitor, will trend cardiac  enzymes every 6 hours and do serial EKGs. Pretest remove for acute current syndrome is low. Patient does have a reproducible chest pain. Will continue pain control with Flexeril. Will avoid NSAIDs due to history of GI bleed.  2. Hypertension. Will continue blood pressure control with amlodipine 10 mg daily, will add hydralazine as needed.  3. Dyslipidemia. Continue lovastatin.  4. Depression. No confusion or agitation, continue the trazodone and venlafaxine.    DVT prophylaxis: SCD  Code Status: Full  Family Communication: No family at bedside Disposition Plan: Home Consults called:  Admission status: Observation   Culver Feighner Gerome Apley MD [facility] Pager [phone]  If 7PM-7AM, please contact night-coverage [email or web address] Password [facility]  08/24/2016, 3:04 PM

## 2016-08-24 NOTE — ED Triage Notes (Signed)
Pt reports she has had "loose dark stools" since last night. No n/v/d. Pt reports she had a bleeding ulcer before, and symptoms feels similar, but not as bad. No blood in stool.

## 2016-08-24 NOTE — ED Notes (Signed)
Pt came out of room upset it took a few minutes for staff to assist her. Was complaining and rude to staff.

## 2016-08-24 NOTE — ED Notes (Signed)
Bed: MB34 Expected date:  Expected time:  Means of arrival:  Comments: Loose stool

## 2016-08-24 NOTE — ED Notes (Signed)
PT upset due to wait time of test result

## 2016-08-24 NOTE — ED Provider Notes (Signed)
[facility] DEPT Provider Note   CSN: [id number] Arrival date & time: 08/24/16  0840     History   Chief Complaint Chief Complaint  Patient presents with  . Diarrhea    HPI Emily Cohen is a 66 y.o. female.  HPI   Dark brown, runny stools today, 4 stools prior to coming here yesterday, 2 this morning.  Every time eat has runny stool, last stool around 7AM was dark, runny.  Did not appear bloody or tarry or black.  No abdominal pain. No nausea or vomiting.   Pain in left shoulder with coughing, reports that had similar pain when had bleed before. Reports pain comes and goes, present since yesterday.  Pain is in left upper chest/shoulder. It is worse with walking while swinging arms, worse with arm movements. Hx of smoking, htn, hlpd, dm, no known hx of cardiac disease or CAD.NO known fam hx.   Past Medical History:  Diagnosis Date  . Alcohol use   . Arthritis   . Blindness   . Breast cancer, left breast ([place])   . Cancer ([place])   . Diabetes mellitus   . Hypertension     Patient Active Problem List   Diagnosis Date Noted  . Chest pain 08/24/2016  . Respiratory failure ([place])   . Acute post-hemorrhagic anemia   . Alcohol abuse   . Acute upper GI bleed 05/22/2016    Past Surgical History:  Procedure Laterality Date  . ESOPHAGOGASTRODUODENOSCOPY N/A 05/23/2016   Procedure: ESOPHAGOGASTRODUODENOSCOPY (EGD);  Surgeon: Milus Banister, MD;  Location: [facility];  Service: Endoscopy;  Laterality: N/A;    OB History    No data available       Home Medications    Prior to Admission medications   Medication Sig Start Date End Date Taking? Authorizing Provider  amLODipine (NORVASC) 10 MG tablet Take 10 mg by mouth daily.   Yes [provider]  lovastatin (MEVACOR) 20 MG tablet Take 20 mg by mouth daily.   Yes [provider]  ondansetron (ZOFRAN) 4 MG tablet Take 4 mg by mouth 3 (three) times daily as needed for nausea or vomiting.    Yes  [provider]  predniSONE (DELTASONE) 20 MG tablet Take 20 mg by mouth daily with breakfast.   Yes [provider]  promethazine (PHENERGAN) 25 MG tablet Take 1 tablet (25 mg total) by mouth every 6 (six) hours as needed for nausea. 05/23/12  Yes Davonna Belling, MD  traMADol (ULTRAM) 50 MG tablet Take 50 mg by mouth 3 (three) times daily.   Yes [provider]  traZODone (DESYREL) 50 MG tablet Take 50 mg by mouth at bedtime.   Yes [provider]  venlafaxine XR (EFFEXOR-XR) 75 MG 24 hr capsule Take 75 mg by mouth daily with breakfast.   Yes [provider]    Family History History reviewed. No pertinent family history.  Social History Social History  Substance Use Topics  . Smoking status: Current Every Day Smoker  . Smokeless tobacco: Never Used  . Alcohol use Yes     Comment: heavy drinker     Allergies   Patient has no known allergies.   Review of Systems Review of Systems  Constitutional: Negative for fever.  HENT: Negative for sore throat.   Eyes: Negative for visual disturbance.  Respiratory: Negative for cough and shortness of breath.   Cardiovascular: Negative for chest pain.  Gastrointestinal: Positive for diarrhea. Negative for abdominal pain, constipation, nausea  and vomiting.  Genitourinary: Negative for difficulty urinating.  Musculoskeletal: Negative for back pain and neck pain.  Skin: Negative for rash.  Neurological: Positive for light-headedness (since being here in ED). Negative for syncope and headaches.     Physical Exam Updated Vital Signs BP 140/65 (BP Location: Right Arm)   Pulse 93   Temp 99.3 F (37.4 C) (Oral)   Resp 20   Ht '5\' 7"'$  (1.702 m)   Wt 147 lb 14.9 oz (67.1 kg)   SpO2 100%   BMI 23.17 kg/m   Physical Exam  Constitutional: She is oriented to person, place, and time. She appears well-developed and well-nourished. No distress.  HENT:  Head: Normocephalic and atraumatic.  Eyes:  Conjunctivae and EOM are normal.  Neck: Normal range of motion.  Cardiovascular: Normal rate, regular rhythm, normal heart sounds and intact distal pulses.  Exam reveals no gallop and no friction rub.   No murmur heard. Pulmonary/Chest: Effort normal and breath sounds normal. No respiratory distress. She has no wheezes. She has no rales. She exhibits tenderness.  Abdominal: Soft. She exhibits no distension. There is no tenderness. There is no guarding.  Musculoskeletal: She exhibits no edema or tenderness.  Neurological: She is alert and oriented to person, place, and time.  Skin: Skin is warm and dry. No rash noted. She is not diaphoretic. No erythema.  Nursing note and vitals reviewed.    ED Treatments / Results  Labs (all labs ordered are listed, but only abnormal results are displayed) Labs Reviewed  CBC WITH DIFFERENTIAL/PLATELET - Abnormal; Notable for the following:       Result Value   WBC 16.1 (*)    Hemoglobin 11.2 (*)    HCT 33.4 (*)    MCV 75.7 (*)    MCH 25.4 (*)    RDW 17.2 (*)    Platelets 417 (*)    Neutro Abs 13.7 (*)    All other components within normal limits  COMPREHENSIVE METABOLIC PANEL - Abnormal; Notable for the following:    Potassium 3.4 (*)    Glucose, Bld 105 (*)    All other components within normal limits  TROPONIN I - Abnormal; Notable for the following:    Troponin I 0.03 (*)    All other components within normal limits  TROPONIN I - Abnormal; Notable for the following:    Troponin I 0.03 (*)    All other components within normal limits  TROPONIN I - Abnormal; Notable for the following:    Troponin I 0.03 (*)    All other components within normal limits  TROPONIN I - Abnormal; Notable for the following:    Troponin I 0.03 (*)    All other components within normal limits  PROTIME-INR  TROPONIN I  COMPREHENSIVE METABOLIC PANEL  POC OCCULT BLOOD, ED  TYPE AND SCREEN  ABO/RH    EKG  EKG Interpretation  Date/Time:  Monday Aug 24 2016  11:28:07 EDT Ventricular Rate:  81 PR Interval:    QRS Duration: 110 QT Interval:  424 QTC Calculation: 493 R Axis:   -40 Text Interpretation:  Sinus rhythm Probable left atrial enlargement Abnormal R-wave progression, early transition Left ventricular hypertrophy Abnormal TW present on prior ECG no longer present No significant change since last tracing Confirmed by [facility] MD, Junie Panning ([id number]) on 08/24/2016 12:05:25 PM       Radiology No results found.  Procedures Procedures (including critical care time)  Medications Ordered in ED Medications  amLODipine (NORVASC) tablet 10  mg (not administered)  pravastatin (PRAVACHOL) tablet 10 mg (not administered)  traMADol (ULTRAM) tablet 50 mg (50 mg Oral Given 08/24/16 2136)  traZODone (DESYREL) tablet 50 mg (50 mg Oral Given 08/24/16 2136)  venlafaxine XR (EFFEXOR-XR) 24 hr capsule 75 mg (not administered)  promethazine (PHENERGAN) tablet 25 mg (not administered)  sodium chloride flush (NS) 0.9 % injection 3 mL (0 mLs Intravenous Duplicate 6/38/45 3646)  sodium chloride flush (NS) 0.9 % injection 3 mL (3 mLs Intravenous Given 08/24/16 2142)  sodium chloride flush (NS) 0.9 % injection 3 mL (not administered)  0.9 %  sodium chloride infusion (not administered)  acetaminophen (TYLENOL) tablet 650 mg (not administered)    Or  acetaminophen (TYLENOL) suppository 650 mg (not administered)  ondansetron (ZOFRAN) tablet 4 mg (not administered)    Or  ondansetron (ZOFRAN) injection 4 mg (not administered)  cyclobenzaprine (FLEXERIL) tablet 5 mg (not administered)  diclofenac sodium (VOLTAREN) 1 % transdermal gel 2 g (2 g Topical Given 08/24/16 2136)  nicotine (NICODERM CQ - dosed in mg/24 hours) patch 14 mg (14 mg Transdermal Patch Applied 08/24/16 1751)  sodium chloride 0.9 % bolus 1,000 mL (0 mLs Intravenous Stopped 08/24/16 1128)  cyclobenzaprine (FLEXERIL) tablet 5 mg (5 mg Oral Given 08/24/16 1122)     Initial Impression / Assessment and Plan  / ED Course  I have reviewed the triage vital signs and the nursing notes.  Pertinent labs & imaging results that were available during my care of the patient were reviewed by me and considered in my medical decision making (see chart for details).     66yo female with hx of etoh use, breast cancer, DM, htn, hlpd, smoking, recent admission in February for gastric ulcer with GI bleed, in ICU on mechanical ventilation, presents with concern for for dark stool.  Patient concerned that this is how GI bleed began. Patient hemodynamically stable, no hypotension, no black or tarry stools, not able to obtain significant sample on exam however it is hemoccult negative. Hgb stable. Doubt acute GI bleed in this setting.  Patient reports left upper chest and shoulder pain. EKG WNL. Troponin positive .03, repeat .03.  While feel shoulder and chest pain may be musculoskeletal given pain worse with movement, concern regarding elevated troponin and feel observation appropriate.   Final Clinical Impressions(s) / ED Diagnoses   Final diagnoses:  Elevated troponin  Diarrhea, unspecified type    New Prescriptions Current Discharge Medication List       Gareth Morgan, MD 08/25/16 [phone]

## 2016-08-24 NOTE — ED Notes (Signed)
Attempted to get an IV. Was unsuccessful. Pt has L arm restriction.

## 2016-08-25 ENCOUNTER — Observation Stay (HOSPITAL_BASED_OUTPATIENT_CLINIC_OR_DEPARTMENT_OTHER): Payer: Medicare Other

## 2016-08-25 DIAGNOSIS — R609 Edema, unspecified: Secondary | ICD-10-CM

## 2016-08-25 DIAGNOSIS — R079 Chest pain, unspecified: Secondary | ICD-10-CM | POA: Diagnosis not present

## 2016-08-25 DIAGNOSIS — R748 Abnormal levels of other serum enzymes: Secondary | ICD-10-CM

## 2016-08-25 DIAGNOSIS — M19019 Primary osteoarthritis, unspecified shoulder: Secondary | ICD-10-CM

## 2016-08-25 DIAGNOSIS — R0789 Other chest pain: Secondary | ICD-10-CM | POA: Diagnosis not present

## 2016-08-25 DIAGNOSIS — F329 Major depressive disorder, single episode, unspecified: Secondary | ICD-10-CM | POA: Diagnosis not present

## 2016-08-25 DIAGNOSIS — E785 Hyperlipidemia, unspecified: Secondary | ICD-10-CM | POA: Diagnosis not present

## 2016-08-25 DIAGNOSIS — M19012 Primary osteoarthritis, left shoulder: Secondary | ICD-10-CM | POA: Diagnosis not present

## 2016-08-25 LAB — TROPONIN I: Troponin I: 0.03 ng/mL (ref <0.03)

## 2016-08-25 LAB — COMPREHENSIVE METABOLIC PANEL
ALBUMIN: 3.1 g/dL — AB (ref 3.5–5.0)
ALT: 20 U/L (ref 14–54)
ANION GAP: 8 (ref 5–15)
AST: 18 U/L (ref 15–41)
Alkaline Phosphatase: 82 U/L (ref 38–126)
BUN: 14 mg/dL (ref 6–20)
CO2: 27 mmol/L (ref 22–32)
Calcium: 8.7 mg/dL — ABNORMAL LOW (ref 8.9–10.3)
Chloride: 104 mmol/L (ref 101–111)
Creatinine, Ser: 0.69 mg/dL (ref 0.44–1.00)
GFR calc Af Amer: >60 mL/min (ref >60)
GFR calc non Af Amer: >60 mL/min (ref >60)
Glucose, Bld: 90 mg/dL (ref 65–99)
POTASSIUM: 3.5 mmol/L (ref 3.5–5.1)
SODIUM: 139 mmol/L (ref 135–145)
TOTAL PROTEIN: 5.9 g/dL — AB (ref 6.5–8.1)
Total Bilirubin: 0.4 mg/dL (ref 0.3–1.2)

## 2016-08-25 MED ORDER — PANTOPRAZOLE SODIUM 40 MG PO TBEC: 40.0000 mg | DELAYED_RELEASE_TABLET | Freq: Every day | ORAL | 0 refills | Status: DC

## 2016-08-25 MED ORDER — CYCLOBENZAPRINE HCL 5 MG PO TABS: 5.0000 mg | ORAL_TABLET | Freq: Three times a day (TID) | ORAL | 0 refills | Status: DC | PRN

## 2016-08-25 MED ORDER — DICLOFENAC SODIUM 1 % TD GEL: 2.0000 g | Freq: Four times a day (QID) | TRANSDERMAL | 0 refills | Status: AC

## 2016-08-25 MED ORDER — ACETAMINOPHEN 500 MG PO TABS: 500.0000 mg | ORAL_TABLET | Freq: Four times a day (QID) | ORAL | 0 refills | Status: DC | PRN

## 2016-08-25 NOTE — Progress Notes (Signed)
**  Preliminary report by tech**  Bilateral lower extremity venous duplex completed. There is no evidence of deep or superficial vein thrombosis involving the right and left lower extremities. All visualized vessels appear patent and compressible. There is no evidence of Baker's cysts bilaterally.  08/25/16 11:22 AM Emily Cohen RVT

## 2016-08-25 NOTE — Progress Notes (Signed)
Patient is stable for discharge. Discharge instructions and medications have been reviewed with the patient and all questions answered.

## 2016-08-25 NOTE — Care Management Note (Signed)
Case Management Note  Patient Details  Name: Emily Cohen MRN: [id number] Date of Birth: 30-May-1950  Subjective/Objective: 66 y/o f admitted w/chest pain. From home.                   Action/Plan:d/c home.   Expected Discharge Date:  08/25/16               Expected Discharge Plan:  Home/Self Care  In-House Referral:     Discharge planning Services  CM Consult  Post Acute Care Choice:    Choice offered to:     DME Arranged:    DME Agency:     HH Arranged:    HH Agency:     Status of Service:  Completed, signed off  If discussed at [employer] of Stay Meetings, dates discussed:    Additional Comments:  Dessa Phi, RN 08/25/2016, 10:47 AM

## 2016-08-25 NOTE — Discharge Summary (Addendum)
Physician Discharge Summary  Emily Cohen NOM:[id number] DOB: 1951-01-18 DOA: 08/24/2016  PCP: Benito Mccreedy, MD  Admit date: 08/24/2016 Discharge date: 08/25/2016  Admitted From: Home Disposition:  Home   Recommendations for Outpatient Follow-up:  1. Follow up with PCP in 1-2 weeks 2. Patient placed on pain regimen with flexeril, acetaminophen and topical voltaren 3. Avoid NSAIDs due to recent GI bleed.    Home Health: No  Equipment/Devices: Na  Discharge Condition: Stable  CODE STATUS: Full  Diet recommendation: Heart Healthy   Brief/Interim Summary: This is a 66 year old female with significant history for hypertension and tobacco abuse. Presents with chest pain, left upper chest which is nonexertional but related to movement of the shoulder joint. Positive trauma about 4 months ago, improved with Flexeril. On the physical examination blood pressure 149/76, heart rate 83, respiratory 17, oxygen saturation 90%. Her oral mucosa is moist, her lungs are clear to auscultation, heart S1-S2 present rhythmic, abdomen soft nontender, right leg nonpitting edema +. Pain is reproducible up on palpation of the left chest wall and passive movement of the left shoulder. Sodium 139, potassium 3.4, chloride 102, bicarbonate 28, glucose 105, BUN 15, creatinine 0.69, white count 16.1, hemoglobin 11.2, hematocrit 33.4, platelets 417, troponin 0.03.  The patient was admitted to the hospital with the working diagnosis atypical chest pain to rule out acute coronary syndrome.  1. Atypical chest pain to rule out acute current syndrome. Patient was admitted to the medical floor with remote telemetry monitoring, patient had serial cardiac enzymes which all remained flat 0.03, follow-up EKG with no ischemic changes. Her pain remained well-controlled with topical Voltaren and as needed Flexeril. Recommend outpatient follow-up. At this point patient has ruled out for acute coronary syndrome.  2. Left  shoulder arthritis. Likely related to recent trauma, will continue pain control with topical Voltaren and muscle relaxant with Flexeril. Will need to have a close follow-up as an outpatient. Will avoid NSAIDs due to recent GI bleed. Will add antiacid therapy with ranitidine. Continue tramadol.  3. Dyslipidemia.  Patient will continue lovastatin.  4. Depression. Continue trazodone and venlafaxine.  Discharge Diagnoses:  Active Problems:   Chest pain    Discharge Instructions   Allergies as of 08/25/2016   No Known Allergies     Medication List    STOP taking these medications   predniSONE 20 MG tablet Commonly known as:  DELTASONE     TAKE these medications   acetaminophen 500 MG tablet Commonly known as:  TYLENOL Take 1 tablet (500 mg total) by mouth every 6 (six) hours as needed.   amLODipine 10 MG tablet Commonly known as:  NORVASC Take 10 mg by mouth daily.   cyclobenzaprine 5 MG tablet Commonly known as:  FLEXERIL Take 1 tablet (5 mg total) by mouth 3 (three) times daily as needed for muscle spasms (right shoulder pain).   diclofenac sodium 1 % Gel Commonly known as:  VOLTAREN Apply 2 g topically 4 (four) times daily. Apply to right shoulder.   lovastatin 20 MG tablet Commonly known as:  MEVACOR Take 20 mg by mouth daily.   ondansetron 4 MG tablet Commonly known as:  ZOFRAN Take 4 mg by mouth 3 (three) times daily as needed for nausea or vomiting.   pantoprazole 40 MG tablet Commonly known as:  PROTONIX Take 1 tablet (40 mg total) by mouth daily.   promethazine 25 MG tablet Commonly known as:  PHENERGAN Take 1 tablet (25 mg total) by mouth every 6 (six)  hours as needed for nausea.   traMADol 50 MG tablet Commonly known as:  ULTRAM Take 50 mg by mouth 3 (three) times daily.   traZODone 50 MG tablet Commonly known as:  DESYREL Take 50 mg by mouth at bedtime.   venlafaxine XR 75 MG 24 hr capsule Commonly known as:  EFFEXOR-XR Take 75 mg by mouth  daily with breakfast.       No Known Allergies  Consultations:     Procedures/Studies:  No results found.    Subjective: Pain well controlled, reproducible to touch and movement of the right shoulder. No dyspnea or angina.   Discharge Exam: Vitals:   08/24/16 2136 08/25/16 0515  BP: 140/65 (!) 141/78  Pulse: 93 77  Resp: 20   Temp: 99.3 F (37.4 C) 98 F (36.7 C)   Vitals:   08/24/16 1628 08/24/16 1643 08/24/16 2136 08/25/16 0515  BP: 107/89  140/65 (!) 141/78  Pulse: 83  93 77  Resp: 16  20   Temp: 98.9 F (37.2 C)  99.3 F (37.4 C) 98 F (36.7 C)  TempSrc: Oral  Oral Oral  SpO2: 100%  100% 100%  Weight:  67.1 kg (147 lb 14.9 oz)    Height:  '5\' 7"'$  (1.702 m)      General: Pt is alert, awake, not in acute distress Cardiovascular: RRR, S1/S2 +, no rubs, no gallops Respiratory: CTA bilaterally, no wheezing, no rhonchi Abdominal: Soft, NT, ND, bowel sounds + Extremities: no edema, no cyanosis. Positive pain in palpation of the left shoulder, limited range of motion due to pain.     The results of significant diagnostics from this hospitalization (including imaging, microbiology, ancillary and laboratory) are listed below for reference.     Microbiology: No results found for this or any previous visit (from the past 240 hour(s)).   Labs: BNP (last 3 results) No results for input(s): BNP in the last 8760 hours. Basic Metabolic Panel:  Recent Labs Lab 08/24/16 0950 08/25/16 0510  NA 139 139  K 3.4* 3.5  CL 102 104  CO2 28 27  GLUCOSE 105* 90  BUN 15 14  CREATININE 0.69 0.69  CALCIUM 9.2 8.7*   Liver Function Tests:  Recent Labs Lab 08/24/16 0950 08/25/16 0510  AST 23 18  ALT 24 20  ALKPHOS 99 82  BILITOT 0.5 0.4  PROT 6.8 5.9*  ALBUMIN 3.5 3.1*   No results for input(s): LIPASE, AMYLASE in the last 168 hours. No results for input(s): AMMONIA in the last 168 hours. CBC:  Recent Labs Lab 08/24/16 0950  WBC 16.1*  NEUTROABS  13.7*  HGB 11.2*  HCT 33.4*  MCV 75.7*  PLT 417*   Cardiac Enzymes:  Recent Labs Lab 08/24/16 1113 08/24/16 1228 08/24/16 1636 08/24/16 2250  TROPONINI 0.03* 0.03* 0.03* 0.03*   BNP: Invalid input(s): POCBNP CBG: No results for input(s): GLUCAP in the last 168 hours. D-Dimer No results for input(s): DDIMER in the last 72 hours. Hgb A1c No results for input(s): HGBA1C in the last 72 hours. Lipid Profile No results for input(s): CHOL, HDL, LDLCALC, TRIG, CHOLHDL, LDLDIRECT in the last 72 hours. Thyroid function studies No results for input(s): TSH, T4TOTAL, T3FREE, THYROIDAB in the last 72 hours.  Invalid input(s): FREET3 Anemia work up No results for input(s): VITAMINB12, FOLATE, FERRITIN, TIBC, IRON, RETICCTPCT in the last 72 hours. Urinalysis    Component Value Date/Time   COLORURINE YELLOW 05/22/2016 2120   APPEARANCEUR CLEAR 05/22/2016 2120   LABSPEC  1.013 05/22/2016 2120   PHURINE 5.0 05/22/2016 2120   GLUCOSEU NEGATIVE 05/22/2016 2120   HGBUR NEGATIVE 05/22/2016 2120   BILIRUBINUR NEGATIVE 05/22/2016 2120   KETONESUR NEGATIVE 05/22/2016 2120   PROTEINUR NEGATIVE 05/22/2016 2120   UROBILINOGEN 0.2 02/20/2013 1609   NITRITE NEGATIVE 05/22/2016 2120   LEUKOCYTESUR NEGATIVE 05/22/2016 2120   Sepsis Labs Invalid input(s): PROCALCITONIN,  WBC,  LACTICIDVEN Microbiology No results found for this or any previous visit (from the past 240 hour(s)).   Time coordinating discharge: 45 minutes  SIGNED:   Tawni Millers, MD  [facility] 08/25/2016, 9:43 AM Pager   If 7PM-7AM, please contact night-coverage [email or web address] Password [email or web address]

## 2016-09-12 ENCOUNTER — Emergency Department (HOSPITAL_COMMUNITY)
Admission: EM | Admit: 2016-09-12 | Discharge: 2016-09-12 | Disposition: A | Discharge status: Home / Self Care | Payer: Medicare Other | Attending: Emergency Medicine | Admitting: Emergency Medicine

## 2016-09-12 ENCOUNTER — Encounter (HOSPITAL_COMMUNITY): Payer: Self-pay | Admitting: *Deleted

## 2016-09-12 DIAGNOSIS — Z853 Personal history of malignant neoplasm of breast: Secondary | ICD-10-CM | POA: Insufficient documentation

## 2016-09-12 DIAGNOSIS — Z79899 Other long term (current) drug therapy: Secondary | ICD-10-CM | POA: Insufficient documentation

## 2016-09-12 DIAGNOSIS — M545 Low back pain: Secondary | ICD-10-CM | POA: Diagnosis not present

## 2016-09-12 DIAGNOSIS — M549 Dorsalgia, unspecified: Secondary | ICD-10-CM | POA: Diagnosis present

## 2016-09-12 DIAGNOSIS — G8929 Other chronic pain: Secondary | ICD-10-CM | POA: Insufficient documentation

## 2016-09-12 DIAGNOSIS — E119 Type 2 diabetes mellitus without complications: Secondary | ICD-10-CM | POA: Diagnosis not present

## 2016-09-12 DIAGNOSIS — Z760 Encounter for issue of repeat prescription: Secondary | ICD-10-CM | POA: Diagnosis not present

## 2016-09-12 DIAGNOSIS — I10 Essential (primary) hypertension: Secondary | ICD-10-CM | POA: Diagnosis not present

## 2016-09-12 DIAGNOSIS — F17200 Nicotine dependence, unspecified, uncomplicated: Secondary | ICD-10-CM | POA: Diagnosis not present

## 2016-09-12 MED ORDER — CYCLOBENZAPRINE HCL 10 MG PO TABS
5.0000 mg | ORAL_TABLET | Freq: Once | ORAL | Status: AC
  Administered 2016-09-12: 5 mg via ORAL
  Filled 2016-09-12: qty 1

## 2016-09-12 MED ORDER — CYCLOBENZAPRINE HCL 5 MG PO TABS: 5.0000 mg | ORAL_TABLET | Freq: Three times a day (TID) | ORAL | 0 refills | Status: AC | PRN

## 2016-09-12 NOTE — ED Provider Notes (Signed)
[facility] DEPT Provider Note   CSN: [id number] Arrival date & time: 09/12/16  2158     History   Chief Complaint Chief Complaint  Patient presents with  . Medication Refill  . Back Pain    HPI Emily Cohen is a 66 y.o. female presents to ED requesting medication refill for flexeril.  Patient reports h/o chronic back pain and left shoulder pain.  She goes to pain management for this, ran out of flexeril 1.5 week ago and pain in back and left shoulder worsened then. Pain exacerbated by palpation and going from laying to sitting.  Patient reports her home care aid is out of town and cannot take her to her PCP (patient does not drive) and cannot get a med refill from PCP because of this. EMS transported patient to ED today.   No recent fall, MVC or injury. No associated fever, n/v/d, abdominal pain, CP, SOB or focal UE weakness or numbness.   HPI  Past Medical History:  Diagnosis Date  . Alcohol use   . Arthritis   . Blindness   . Breast cancer, left breast ([place])   . Cancer ([place])   . Diabetes mellitus   . Hypertension     Patient Active Problem List   Diagnosis Date Noted  . Chest pain 08/24/2016  . Respiratory failure ([place])   . Acute post-hemorrhagic anemia   . Alcohol abuse   . Acute upper GI bleed 05/22/2016    Past Surgical History:  Procedure Laterality Date  . ESOPHAGOGASTRODUODENOSCOPY N/A 05/23/2016   Procedure: ESOPHAGOGASTRODUODENOSCOPY (EGD);  Surgeon: Milus Banister, MD;  Location: [facility];  Service: Endoscopy;  Laterality: N/A;    OB History    No data available       Home Medications    Prior to Admission medications   Medication Sig Start Date End Date Taking? Authorizing Provider  acetaminophen (TYLENOL) 500 MG tablet Take 1 tablet (500 mg total) by mouth every 6 (six) hours as needed. 08/25/16   Arrien, Jimmy Picket, MD  amLODipine (NORVASC) 10 MG tablet Take 10 mg by mouth daily.    [provider]  cyclobenzaprine  (FLEXERIL) 5 MG tablet Take 1 tablet (5 mg total) by mouth 3 (three) times daily as needed for muscle spasms (right shoulder pain). 09/12/16 09/27/16  Kinnie Feil, PA-C  diclofenac sodium (VOLTAREN) 1 % GEL Apply 2 g topically 4 (four) times daily. Apply to right shoulder. 08/25/16   Arrien, Jimmy Picket, MD  lovastatin (MEVACOR) 20 MG tablet Take 20 mg by mouth daily.    [provider]  ondansetron (ZOFRAN) 4 MG tablet Take 4 mg by mouth 3 (three) times daily as needed for nausea or vomiting.     [provider]  pantoprazole (PROTONIX) 40 MG tablet Take 1 tablet (40 mg total) by mouth daily. 08/25/16 09/24/16  Arrien, Jimmy Picket, MD  promethazine (PHENERGAN) 25 MG tablet Take 1 tablet (25 mg total) by mouth every 6 (six) hours as needed for nausea. 05/23/12   Davonna Belling, MD  traMADol (ULTRAM) 50 MG tablet Take 50 mg by mouth 3 (three) times daily.    [provider]  traZODone (DESYREL) 50 MG tablet Take 50 mg by mouth at bedtime.    [provider]  venlafaxine XR (EFFEXOR-XR) 75 MG 24 hr capsule Take 75 mg by mouth daily with breakfast.    [provider]    Family History No family history on file.  Social History  Social History  Substance Use Topics  . Smoking status: Current Every Day Smoker  . Smokeless tobacco: Never Used  . Alcohol use Yes     Comment: heavy drinker     Allergies   Patient has no known allergies.   Review of Systems Review of Systems  Constitutional: Negative for fever.  HENT: Negative for congestion and sore throat.   Respiratory: Negative for cough and shortness of breath.   Cardiovascular: Negative for chest pain.  Gastrointestinal: Negative for abdominal pain, constipation, diarrhea, nausea and vomiting.  Genitourinary: Negative for difficulty urinating.  Musculoskeletal: Positive for arthralgias and back pain. Negative for joint swelling.  Skin: Negative for color change and wound.    Neurological: Negative for dizziness, weakness, light-headedness and headaches.     Physical Exam Updated Vital Signs BP 124/71 (BP Location: Left Arm)   Pulse 90   Temp 98.3 F (36.8 C) (Oral)   Resp 18   Ht 5\' 7"  (1.702 m)   Wt 67.1 kg (148 lb)   SpO2 95%   BMI 23.18 kg/m   Physical Exam  Constitutional: She is oriented to person, place, and time. She appears well-developed and well-nourished. No distress.  NAD.  HENT:  Head: Normocephalic and atraumatic.  Right Ear: External ear normal.  Left Ear: External ear normal.  Nose: Nose normal.  Mouth/Throat: Oropharynx is clear and moist. No oropharyngeal exudate.  Eyes: Conjunctivae and EOM are normal. Pupils are equal, round, and reactive to light. No scleral icterus.  Neck: Normal range of motion. Neck supple.  Cardiovascular: Normal rate, regular rhythm and normal heart sounds.   No murmur heard. Pulmonary/Chest: Effort normal and breath sounds normal. She has no wheezes.  Abdominal: Soft. There is no tenderness.  No CVAT bilaterally. No suprapubic tenderness. Negative Murphy's. Negative McBurney's. Negative Psoas sign.  Musculoskeletal: Normal range of motion. She exhibits tenderness. She exhibits no deformity.       Left shoulder: She exhibits tenderness, bony tenderness and pain.       Arms: Tenderness to left shoulder, left scapula, left sided thoracic paraspinal musculature and upper left-sided chest. Pain reported with movement of left shoulder.  Neurological: She is alert and oriented to person, place, and time.  Skin: Skin is warm and dry. Capillary refill takes less than 2 seconds.  Psychiatric: She has a normal mood and affect. Her behavior is normal. Judgment and thought content normal.  Nursing note and vitals reviewed.    ED Treatments / Results  Labs (all labs ordered are listed, but only abnormal results are displayed) Labs Reviewed - No data to display  EKG  EKG Interpretation None        Radiology No results found.  Procedures Procedures (including critical care time)  Medications Ordered in ED Medications  cyclobenzaprine (FLEXERIL) tablet 5 mg (5 mg Oral Given 09/12/16 2254)     Initial Impression / Assessment and Plan / ED Course  I have reviewed the triage vital signs and the nursing notes.  Pertinent labs & imaging results that were available during my care of the patient were reviewed by me and considered in my medical decision making (see chart for details).    66 year old female with chronic back pain and left shoulder pain. Requesting refill of Flexeril which she ran out of 1.5 weeks ago, acute on chronic musculoskeletal pain started around this time. No recent injury. Patient does not drive and notes that her home health aide is currently out of town and she cannot  get transported to her PCP office, she was brought in by EMS to the ED. Pain is reproducible upon palpation and ROM of left upper extremity consistent with musculoskeletal etiology. Doubt ACS. She was recently discharged for left-sided chest pain/ACS rule out two weeks ago.  Her work up was normal and ACS was ruled out per discharge summary. Her pain in the hospital was well controlled on Flexeril and Voltaren gel which she reports have been provided adequate pain control since.   Flexeril prescription handed to patient. ED return precautions given.   Final Clinical Impressions(s) / ED Diagnoses   Final diagnoses:  Medication refill  Chronic bilateral low back pain without sciatica    New Prescriptions Current Discharge Medication List       Arlean Hopping 09/12/16 2255    Tanna Furry, MD 09/25/16 1450

## 2016-09-12 NOTE — Discharge Instructions (Signed)
Your Flexeril (muscle relaxer) prescription has been refilled.  Please continue using Flexeril and Voltaren gel as needed for your chronic pain.Contact your primary care provider as needed for future medical refills.  Return to the emergency department if your pain changes and or if it's associated with chest pain, shortness of breath, nausea, sweating.

## 2016-09-12 NOTE — ED Triage Notes (Signed)
Pt bib EMS from home and presents with chronic back, shoulder, and musculoskeletal chest pain x 6 months. Over the past week, pt has not had her flexeril and the pain has become worse. Pt has an appointment to get her medications refilled on Tuesday but could not wait until then. Pt a/o x 4 and ambulatory.  NAD noted. Skin warm and dry.

## 2016-09-22 ENCOUNTER — Emergency Department (HOSPITAL_COMMUNITY)
Admission: EM | Admit: 2016-09-22 | Discharge: 2016-09-22 | Disposition: A | Discharge status: Home / Self Care | Payer: Medicare Other | Attending: Emergency Medicine | Admitting: Emergency Medicine

## 2016-09-22 ENCOUNTER — Encounter (HOSPITAL_COMMUNITY): Payer: Self-pay | Admitting: Emergency Medicine

## 2016-09-22 DIAGNOSIS — Z853 Personal history of malignant neoplasm of breast: Secondary | ICD-10-CM | POA: Insufficient documentation

## 2016-09-22 DIAGNOSIS — G8929 Other chronic pain: Secondary | ICD-10-CM

## 2016-09-22 DIAGNOSIS — Z79899 Other long term (current) drug therapy: Secondary | ICD-10-CM | POA: Diagnosis not present

## 2016-09-22 DIAGNOSIS — M545 Low back pain, unspecified: Secondary | ICD-10-CM

## 2016-09-22 DIAGNOSIS — E119 Type 2 diabetes mellitus without complications: Secondary | ICD-10-CM | POA: Diagnosis not present

## 2016-09-22 DIAGNOSIS — F17200 Nicotine dependence, unspecified, uncomplicated: Secondary | ICD-10-CM | POA: Diagnosis not present

## 2016-09-22 DIAGNOSIS — I10 Essential (primary) hypertension: Secondary | ICD-10-CM | POA: Diagnosis not present

## 2016-09-22 MED ORDER — METHOCARBAMOL 500 MG PO TABS
500.0000 mg | ORAL_TABLET | Freq: Once | ORAL | Status: AC
  Administered 2016-09-22: 500 mg via ORAL
  Filled 2016-09-22: qty 1

## 2016-09-22 MED ORDER — METHOCARBAMOL 500 MG PO TABS: 500.0000 mg | ORAL_TABLET | Freq: Two times a day (BID) | ORAL | 0 refills | Status: DC

## 2016-09-22 MED ORDER — OXYCODONE-ACETAMINOPHEN 5-325 MG PO TABS
1.0000 | ORAL_TABLET | Freq: Once | ORAL | Status: AC
  Administered 2016-09-22: 1 via ORAL
  Filled 2016-09-22: qty 1

## 2016-09-22 NOTE — Discharge Instructions (Signed)
Take your medications as prescribed. You may also apply ice and/or heat to affected area for 15-20 minutes 3-4 times daily for additional pain relief. I recommend refraining from doing any heavy lifting, squatting or repetitive movements that exacerbate her symptoms for the next few days. Follow-up with your pain management provider in the next week if your symptoms have not improved. Please return to the Emergency Department if symptoms worsen or new onset of denies fever, numbness, tingling, groin anesthesia, loss of bowel or bladder, weakness, chest pain, abdominal pain, vomiting.

## 2016-09-22 NOTE — ED Provider Notes (Signed)
[facility] DEPT Provider Note   CSN: [id number] Arrival date & time: 09/22/16  1314  By signing my name below, I, Margit Banda, attest that this documentation has been prepared under the direction and in the presence of Harlene Ramus, PA-C. Electronically Signed: Margit Banda, ED Scribe. 09/22/16. 1:43 PM.  History   Chief Complaint Chief Complaint  Patient presents with  . Back Pain    HPI Emily Cohen is a 66 y.o. female with a PMHx of chronic back pain, breast cancer (s/p mastectomy over 20 years ago), DM and HTN, who presents to the Emergency Department complaining of acuet on chronic right lower back pain for the last couple of months, worsening today. Pt was resting when pain increased. Denies radiation. Pain is worse with movement. Denies any recent falls, injuries or trauma. Pt has been taking her prescribed medication (Tramadol and Flexeril) with mild to no relief. She has an appointment with the pain clinic on June 25th. No IV drug use or recent spinal manipulation. Pt denies fever, neck pain, nausea, vomiting, diarrhea, bladder or bowel incontinence, dysuria, hematuria, saddle anesthesia, numbness, and weakness.Reports her pain today is consistent with her chronic back pain.  HPI  Past Medical History:  Diagnosis Date  . Alcohol use   . Arthritis   . Blindness   . Breast cancer, left breast ([place])   . Cancer ([place])   . Diabetes mellitus   . Hypertension     Patient Active Problem List   Diagnosis Date Noted  . Chest pain 08/24/2016  . Respiratory failure ([place])   . Acute post-hemorrhagic anemia   . Alcohol abuse   . Acute upper GI bleed 05/22/2016    Past Surgical History:  Procedure Laterality Date  . ESOPHAGOGASTRODUODENOSCOPY N/A 05/23/2016   Procedure: ESOPHAGOGASTRODUODENOSCOPY (EGD);  Surgeon: Milus Banister, MD;  Location: [facility];  Service: Endoscopy;  Laterality: N/A;    OB History    No data available       Home Medications      Prior to Admission medications   Medication Sig Start Date End Date Taking? Authorizing Provider  acetaminophen (TYLENOL) 500 MG tablet Take 1 tablet (500 mg total) by mouth every 6 (six) hours as needed. 08/25/16   Arrien, Jimmy Picket, MD  amLODipine (NORVASC) 10 MG tablet Take 10 mg by mouth daily.    [provider]  cyclobenzaprine (FLEXERIL) 5 MG tablet Take 1 tablet (5 mg total) by mouth 3 (three) times daily as needed for muscle spasms (right shoulder pain). 09/12/16 09/27/16  Kinnie Feil, PA-C  diclofenac sodium (VOLTAREN) 1 % GEL Apply 2 g topically 4 (four) times daily. Apply to right shoulder. 08/25/16   Arrien, Jimmy Picket, MD  lovastatin (MEVACOR) 20 MG tablet Take 20 mg by mouth daily.    [provider]  methocarbamol (ROBAXIN) 500 MG tablet Take 1 tablet (500 mg total) by mouth 2 (two) times daily. 09/22/16   Nona Dell, PA-C  ondansetron (ZOFRAN) 4 MG tablet Take 4 mg by mouth 3 (three) times daily as needed for nausea or vomiting.     [provider]  pantoprazole (PROTONIX) 40 MG tablet Take 1 tablet (40 mg total) by mouth daily. 08/25/16 09/24/16  Arrien, Jimmy Picket, MD  promethazine (PHENERGAN) 25 MG tablet Take 1 tablet (25 mg total) by mouth every 6 (six) hours as needed for nausea. 05/23/12   Davonna Belling, MD  traMADol (ULTRAM) 50 MG tablet Take 50 mg by mouth 3 (  three) times daily.    [provider]  traZODone (DESYREL) 50 MG tablet Take 50 mg by mouth at bedtime.    [provider]  venlafaxine XR (EFFEXOR-XR) 75 MG 24 hr capsule Take 75 mg by mouth daily with breakfast.    [provider]    Family History History reviewed. No pertinent family history.  Social History Social History  Substance Use Topics  . Smoking status: Current Every Day Smoker  . Smokeless tobacco: Never Used  . Alcohol use Yes     Comment: heavy drinker     Allergies   Patient has no known  allergies.   Review of Systems Review of Systems  Constitutional: Negative for fever.  Gastrointestinal: Negative for diarrhea, nausea and vomiting.       - bowel incontinence.  Genitourinary: Negative for dysuria and hematuria.       -Bladder incontinence.  Musculoskeletal: Positive for arthralgias and back pain. Negative for neck pain.  Neurological: Negative for weakness and numbness.     Physical Exam Updated Vital Signs BP 134/66 (BP Location: Right Arm)   Pulse 98   Temp 98.3 F (36.8 C) (Oral)   Resp 16   Ht 5\' 7"  (1.702 m)   Wt 67.1 kg (148 lb)   SpO2 97%   BMI 23.18 kg/m   Physical Exam  Constitutional: She is oriented to person, place, and time. She appears well-developed and well-nourished. No distress.  HENT:  Head: Normocephalic and atraumatic.  Mouth/Throat: Oropharynx is clear and moist. No oropharyngeal exudate.  Eyes: Conjunctivae and EOM are normal. Right eye exhibits no discharge. Left eye exhibits no discharge. No scleral icterus.  Neck: Normal range of motion. Neck supple.  Cardiovascular: Normal rate, regular rhythm, normal heart sounds and intact distal pulses.   Pulmonary/Chest: Effort normal and breath sounds normal. No respiratory distress. She has no wheezes. She has no rales. She exhibits no tenderness.  Abdominal: Soft. Bowel sounds are normal. She exhibits no distension and no mass. There is no tenderness. There is no rebound and no guarding. No hernia.  Musculoskeletal: Normal range of motion. She exhibits tenderness. She exhibits no edema or deformity.  No midline C, T, or L tenderness. Mild tenderness over right lumbar paraspinal muscles. Full range of motion of neck and decreased motion of back due to pain. Full range of motion of bilateral upper and lower extremities, with 5/5 strength. Sensation intact. 2+ radial and PT pulses. Cap refill <2 seconds. Patient able to stand and ambulate without assistance.    Neurological: She is alert and  oriented to person, place, and time. She has normal strength. She displays normal reflexes. No sensory deficit. Gait normal.  Skin: Skin is warm and dry. She is not diaphoretic.  Nursing note and vitals reviewed.    ED Treatments / Results  DIAGNOSTIC STUDIES: Oxygen Saturation is 97% on RA, normal by my interpretation.   COORDINATION OF CARE: 1:43 PM-Discussed next steps with pt which includes following up with her doctor at the pain clinic. Pt verbalized understanding and is agreeable with the plan.   Labs (all labs ordered are listed, but only abnormal results are displayed) Labs Reviewed - No data to display  EKG  EKG Interpretation None       Radiology No results found.  Procedures Procedures (including critical care time)  Medications Ordered in ED Medications  oxyCODONE-acetaminophen (PERCOCET/ROXICET) 5-325 MG per tablet 1 tablet (not administered)  methocarbamol (ROBAXIN) tablet 500 mg (not administered)  Initial Impression / Assessment and Plan / ED Course  I have reviewed the triage vital signs and the nursing notes.  Pertinent labs & imaging results that were available during my care of the patient were reviewed by me and considered in my medical decision making (see chart for details).     Patient presents with acute on chronic right lower back pain. Denies radiation. Denies any recent fall, trauma or injury. No relief with home prescriptions of tramadol and Flexeril. Patient is managed by pain clinic and reports that her next appointment is not until June 25. No back pain red flags. VSS. Exam revealed tenderness over right lumbar paraspinal muscles. No midline spinal tenderness. No neuro deficits. Bilateral upper and lower extremities neurovascularly intact. Patient able to stand and ambulate but endorses associated pain. Patient given pain meds and muscle relaxant in the ED.   [place] Controlled Substance reporting System queried, patient  recently filled prescription of 50 mg tramadol #90 on 09/11/16 and same rx on 08/11/16. Due to patient currently being managed by pain clinic, I discussed with her that I would give her pain medicine in the ED to help with her acute on chronic back pain but discussed that she needs to follow-up with her provider at the clinic regarding her worsening chronic pain. Plan to discharge patient home with prescription of Robaxin and advised to continue taking her home pain medications and Tylenol as needed for additional pain relief. Discussed symptomatic treatment. Discussed strict return precautions.  Final Clinical Impressions(s) / ED Diagnoses   Final diagnoses:  Chronic right-sided low back pain without sciatica    New Prescriptions New Prescriptions   METHOCARBAMOL (ROBAXIN) 500 MG TABLET    Take 1 tablet (500 mg total) by mouth 2 (two) times daily.   I personally performed the services described in this documentation, which was scribed in my presence. The recorded information has been reviewed and is accurate.     Nona Dell, PA-C 09/22/16 1359    Davonna Belling, MD 09/22/16 1616

## 2016-09-22 NOTE — ED Notes (Signed)
Pain medication given prior to discharge. Patient advised about side effects of medications and  to avoid driving for a minimum of 4 hours.

## 2016-09-22 NOTE — ED Triage Notes (Addendum)
Per EMS, patient from c/o lower right back pain since last night. Hx chronic back pain. Ambulatory with EMS. Denies injury, N/V/D, urinary sx.  BP 132/70 HR 100 RR 16 O2 97% CBG 126

## 2016-10-01 ENCOUNTER — Emergency Department (HOSPITAL_COMMUNITY): Payer: Medicare Other

## 2016-10-01 ENCOUNTER — Encounter (HOSPITAL_COMMUNITY): Payer: Self-pay

## 2016-10-01 ENCOUNTER — Emergency Department (HOSPITAL_COMMUNITY)
Admission: EM | Admit: 2016-10-01 | Discharge: 2016-10-01 | Disposition: A | Discharge status: Home / Self Care | Payer: Medicare Other | Attending: Emergency Medicine | Admitting: Emergency Medicine

## 2016-10-01 DIAGNOSIS — K5900 Constipation, unspecified: Secondary | ICD-10-CM | POA: Diagnosis present

## 2016-10-01 DIAGNOSIS — R1084 Generalized abdominal pain: Secondary | ICD-10-CM | POA: Diagnosis not present

## 2016-10-01 DIAGNOSIS — K5903 Drug induced constipation: Secondary | ICD-10-CM | POA: Insufficient documentation

## 2016-10-01 DIAGNOSIS — M549 Dorsalgia, unspecified: Secondary | ICD-10-CM | POA: Diagnosis not present

## 2016-10-01 DIAGNOSIS — E119 Type 2 diabetes mellitus without complications: Secondary | ICD-10-CM | POA: Diagnosis not present

## 2016-10-01 DIAGNOSIS — Z79899 Other long term (current) drug therapy: Secondary | ICD-10-CM | POA: Insufficient documentation

## 2016-10-01 DIAGNOSIS — F17200 Nicotine dependence, unspecified, uncomplicated: Secondary | ICD-10-CM | POA: Diagnosis not present

## 2016-10-01 DIAGNOSIS — I10 Essential (primary) hypertension: Secondary | ICD-10-CM | POA: Diagnosis not present

## 2016-10-01 DIAGNOSIS — Z853 Personal history of malignant neoplasm of breast: Secondary | ICD-10-CM | POA: Diagnosis not present

## 2016-10-01 DIAGNOSIS — R1030 Lower abdominal pain, unspecified: Secondary | ICD-10-CM

## 2016-10-01 LAB — COMPREHENSIVE METABOLIC PANEL
ALBUMIN: 4 g/dL (ref 3.5–5.0)
ALK PHOS: 120 U/L (ref 38–126)
ALT: 13 U/L — AB (ref 14–54)
AST: 14 U/L — AB (ref 15–41)
Anion gap: 11 (ref 5–15)
BUN: 11 mg/dL (ref 6–20)
CALCIUM: 9.6 mg/dL (ref 8.9–10.3)
CHLORIDE: 106 mmol/L (ref 101–111)
CO2: 24 mmol/L (ref 22–32)
CREATININE: 0.55 mg/dL (ref 0.44–1.00)
GFR calc non Af Amer: >60 mL/min (ref >60)
GLUCOSE: 95 mg/dL (ref 65–99)
Potassium: 3 mmol/L — ABNORMAL LOW (ref 3.5–5.1)
SODIUM: 141 mmol/L (ref 135–145)
Total Bilirubin: 0.4 mg/dL (ref 0.3–1.2)
Total Protein: 7.5 g/dL (ref 6.5–8.1)

## 2016-10-01 LAB — CBC
HCT: 36.1 % (ref 36.0–46.0)
Hemoglobin: 12.3 g/dL (ref 12.0–15.0)
MCH: 24.7 pg — AB (ref 26.0–34.0)
MCHC: 34.1 g/dL (ref 30.0–36.0)
MCV: 72.6 fL — AB (ref 78.0–100.0)
PLATELETS: 453 10*3/uL — AB (ref 150–400)
RBC: 4.97 MIL/uL (ref 3.87–5.11)
RDW: 19 % — AB (ref 11.5–15.5)
WBC: 12.7 10*3/uL — ABNORMAL HIGH (ref 4.0–10.5)

## 2016-10-01 LAB — URINALYSIS, ROUTINE W REFLEX MICROSCOPIC
BILIRUBIN URINE: NEGATIVE
GLUCOSE, UA: NEGATIVE mg/dL
HGB URINE DIPSTICK: NEGATIVE
Ketones, ur: NEGATIVE mg/dL
Leukocytes, UA: NEGATIVE
Nitrite: NEGATIVE
PROTEIN: NEGATIVE mg/dL
Specific Gravity, Urine: 1.011 (ref 1.005–1.030)
pH: 5 (ref 5.0–8.0)

## 2016-10-01 LAB — LIPASE, BLOOD: LIPASE: 27 U/L (ref 11–51)

## 2016-10-01 MED ORDER — IOPAMIDOL (ISOVUE-300) INJECTION 61%
INTRAVENOUS | Status: AC
  Filled 2016-10-01: qty 100

## 2016-10-01 MED ORDER — HYDROCODONE-ACETAMINOPHEN 5-325 MG PO TABS
1.0000 | ORAL_TABLET | Freq: Once | ORAL | Status: AC
  Administered 2016-10-01: 1 via ORAL
  Filled 2016-10-01: qty 1

## 2016-10-01 MED ORDER — FLEET ENEMA 7-19 GM/118ML RE ENEM: 1.0000 | ENEMA | Freq: Every day | RECTAL | 0 refills | Status: DC | PRN

## 2016-10-01 MED ORDER — IOPAMIDOL (ISOVUE-300) INJECTION 61%
100.0000 mL | Freq: Once | INTRAVENOUS | Status: AC | PRN
  Administered 2016-10-01: 100 mL via INTRAVENOUS

## 2016-10-01 MED ORDER — BISACODYL 5 MG PO TBEC: 5.0000 mg | DELAYED_RELEASE_TABLET | Freq: Two times a day (BID) | ORAL | 0 refills | Status: DC

## 2016-10-01 MED ORDER — SODIUM CHLORIDE 0.9 % IV BOLUS (SEPSIS)
1000.0000 mL | Freq: Once | INTRAVENOUS | Status: AC
  Administered 2016-10-01: 1000 mL via INTRAVENOUS

## 2016-10-01 MED ORDER — HYDROCODONE-ACETAMINOPHEN 5-325 MG PO TABS: 1.0000 | ORAL_TABLET | Freq: Four times a day (QID) | ORAL | 0 refills | Status: DC | PRN

## 2016-10-01 NOTE — ED Triage Notes (Signed)
Patient states she was on Oxycodone x 2 years and was sent to the Pain Clinic where she was prescribed Tramadol. Patient states she is now constipated. Patient tearful. Patient stated, "I am 66 years old. I'm suppose to be happy., but I am in terrible pain and no one knows. It is not my fault you prescribed me the oxycodone. The pain clinic treats me like a second class citizen."   Patient states she is having mid abdominal pain. Patient states she has not had a BM in 2-3 weeks. Patient states she has had laxatives, but nothing is working.

## 2016-10-01 NOTE — ED Notes (Signed)
Pt used the restroom when she first got here and doesn't think she'll be able to give a specimen atm.   She asked for water- not sure if she can have it.

## 2016-10-01 NOTE — ED Notes (Signed)
Lab draw unsuccessful 

## 2016-10-01 NOTE — ED Provider Notes (Addendum)
[facility] DEPT Provider Note   CSN: [id number] Arrival date & time: 10/01/16  1744     History   Chief Complaint Chief Complaint  Patient presents with  . Constipation  . Abdominal Pain    HPI Emily Cohen is a 66 y.o. female.  HPI  Patient presents with concern of abdominal pain, constipation. She notes over the past few weeks she has had increasing abdominal discomfort, diffusely, and lower abdomen. There is associated constipation, and the patient states that she has had no bowel movements in one month. She repeats this timeframe several times during the initial evaluation. She notes concurrent nausea, anorexia. Patient has a notable history of chronic back pain, and has been on narcotic pain medication, and recently switched to tramadol. It does not seem as though she is on a consistent stool softener plan. No new fever, chills, confusion, disorientation.   Past Medical History:  Diagnosis Date  . Alcohol use   . Arthritis   . Blindness   . Breast cancer, left breast ([place])   . Cancer ([place])   . Diabetes mellitus   . Hypertension     Patient Active Problem List   Diagnosis Date Noted  . Chest pain 08/24/2016  . Respiratory failure ([place])   . Acute post-hemorrhagic anemia   . Alcohol abuse   . Acute upper GI bleed 05/22/2016    Past Surgical History:  Procedure Laterality Date  . ESOPHAGOGASTRODUODENOSCOPY N/A 05/23/2016   Procedure: ESOPHAGOGASTRODUODENOSCOPY (EGD);  Surgeon: Milus Banister, MD;  Location: [facility];  Service: Endoscopy;  Laterality: N/A;    OB History    No data available       Home Medications    Prior to Admission medications   Medication Sig Start Date End Date Taking? Authorizing Provider  acetaminophen (TYLENOL) 500 MG tablet Take 1 tablet (500 mg total) by mouth every 6 (six) hours as needed. Patient taking differently: Take 500 mg by mouth every 6 (six) hours as needed for mild pain, moderate pain, fever or  headache.  08/25/16  Yes Arrien, Jimmy Picket, MD  amLODipine (NORVASC) 10 MG tablet Take 10 mg by mouth daily.   Yes [provider]  cyclobenzaprine (FLEXERIL) 5 MG tablet Take 5 mg by mouth 3 (three) times daily as needed for muscle spasms.   Yes [provider]  diclofenac sodium (VOLTAREN) 1 % GEL Apply 2 g topically 4 (four) times daily. Apply to right shoulder. Patient taking differently: Apply 2 g topically 4 (four) times daily as needed (for right shoulder pain).  08/25/16  Yes Arrien, Jimmy Picket, MD  lovastatin (MEVACOR) 20 MG tablet Take 20 mg by mouth daily.   Yes [provider]  methocarbamol (ROBAXIN) 500 MG tablet Take 1 tablet (500 mg total) by mouth 2 (two) times daily. 09/22/16  Yes Nona Dell, PA-C  ondansetron (ZOFRAN) 4 MG tablet Take 4 mg by mouth 3 (three) times daily as needed for nausea or vomiting.    Yes [provider]  pantoprazole (PROTONIX) 40 MG tablet Take 40 mg by mouth daily.   Yes [provider]  promethazine (PHENERGAN) 25 MG tablet Take 1 tablet (25 mg total) by mouth every 6 (six) hours as needed for nausea. 05/23/12  Yes Davonna Belling, MD  traMADol (ULTRAM) 50 MG tablet Take 50 mg by mouth 3 (three) times daily.   Yes [provider]  traZODone (DESYREL) 50 MG tablet Take 50 mg by mouth at bedtime.  Yes [provider]  venlafaxine XR (EFFEXOR-XR) 75 MG 24 hr capsule Take 75 mg by mouth daily with breakfast.   Yes [provider]    Family History History reviewed. No pertinent family history.  Social History Social History  Substance Use Topics  . Smoking status: Current Every Day Smoker  . Smokeless tobacco: Never Used  . Alcohol use Yes     Comment: heavy drinker     Allergies   Patient has no known allergies.   Review of Systems Review of Systems  Constitutional:       Per HPI, otherwise negative  HENT:       Per HPI, otherwise negative    Respiratory:       Per HPI, otherwise negative  Cardiovascular:       Per HPI, otherwise negative  Gastrointestinal: Positive for abdominal pain, constipation and nausea. Negative for diarrhea and vomiting.  Endocrine:       Negative aside from HPI  Genitourinary:       Neg aside from HPI   Musculoskeletal:       Per HPI, otherwise negative  Skin: Negative.   Neurological: Negative for syncope.     Physical Exam Updated Vital Signs BP (!) 167/83 (BP Location: Right Arm)   Pulse 71   Temp 98.1 F (36.7 C) (Oral)   Resp 18   Ht '5\' 7"'$  (1.702 m)   Wt 67.1 kg (148 lb)   SpO2 100%   BMI 23.18 kg/m   Physical Exam  Constitutional: She is oriented to person, place, and time. She appears well-developed and well-nourished. No distress.  HENT:  Head: Normocephalic and atraumatic.  Eyes: Conjunctivae and EOM are normal.  Cardiovascular: Normal rate and regular rhythm.   Pulmonary/Chest: Effort normal and breath sounds normal. No stridor. No respiratory distress.  Abdominal: She exhibits no distension. There is tenderness.  Patient has no distention, no peritoneal findings, mild tenderness across the lower abdomen.  Musculoskeletal: She exhibits no edema.  Neurological: She is alert and oriented to person, place, and time. No cranial nerve deficit.  Skin: Skin is warm and dry.  Psychiatric: She has a normal mood and affect.  Nursing note and vitals reviewed.    ED Treatments / Results  Labs (all labs ordered are listed, but only abnormal results are displayed) Labs Reviewed  COMPREHENSIVE METABOLIC PANEL - Abnormal; Notable for the following:       Result Value   Potassium 3.0 (*)    AST 14 (*)    ALT 13 (*)    All other components within normal limits  CBC - Abnormal; Notable for the following:    WBC 12.7 (*)    MCV 72.6 (*)    MCH 24.7 (*)    RDW 19.0 (*)    Platelets 453 (*)    All other components within normal limits  URINALYSIS, ROUTINE W REFLEX MICROSCOPIC  - Abnormal; Notable for the following:    Color, Urine STRAW (*)    All other components within normal limits  LIPASE, BLOOD    EKG Sinus rhythm rate 87, LAD, nonspecific T-wave changes, hypertrophic changes, abnormal  Radiology Ct Abdomen Pelvis W Contrast  Result Date: 10/01/2016 CLINICAL DATA:  Acute onset of generalized abdominal and back pain. Initial encounter. EXAM: CT ABDOMEN AND PELVIS WITH CONTRAST TECHNIQUE: Multidetector CT imaging of the abdomen and pelvis was performed using the standard protocol following bolus administration of intravenous contrast. CONTRAST:  188m ISOVUE-300 IOPAMIDOL (ISOVUE-300) INJECTION  61% COMPARISON:  CT of the abdomen and pelvis from 05/23/2012 FINDINGS: Lower chest: The visualized lung bases are grossly clear. The visualized portions of the mediastinum are unremarkable. A left-sided breast implant is partially imaged. Hepatobiliary: A 6 mm nonspecific hypodensity is noted at the right hepatic lobe. The liver is otherwise unremarkable. A small calcification is again noted at the fundus of the gallbladder, stable from 2014. The gallbladder is otherwise unremarkable. The common bile duct remains normal in caliber. Pancreas: Calcification at the pancreatic head is somewhat more prominent than in 2014, reflecting chronic sequelae of pancreatitis. The patient's lipase is normal, suggesting against acute pancreatitis. Spleen: The spleen is unremarkable in appearance. Adrenals/Urinary Tract: The adrenal glands are unremarkable in appearance. Scattered bilateral renal cysts are seen. There is no evidence of hydronephrosis. No renal or ureteral stones are identified. No perinephric stranding is appreciated. Stomach/Bowel: The stomach is unremarkable in appearance. The small bowel is within normal limits. The appendix is normal in caliber, without evidence of appendicitis. The colon is unremarkable in appearance. Vascular/Lymphatic: Scattered calcification is seen along  the abdominal aorta and its branches. The abdominal aorta is otherwise grossly unremarkable. The inferior vena cava is grossly unremarkable. No retroperitoneal lymphadenopathy is seen. No pelvic sidewall lymphadenopathy is identified. Reproductive: The bladder is mildly distended and grossly unremarkable. The patient is status post hysterectomy. No suspicious adnexal masses are seen. Other: No additional soft tissue abnormalities are seen. Musculoskeletal: No acute osseous abnormalities are identified. A focal lucency at the superior endplate of L2 could reflect a lytic lesion. There appear to be multiple additional abnormal lucencies throughout the lumbar spine. There is mild chronic compression deformity of vertebral body T12. The visualized musculature is unremarkable in appearance. IMPRESSION: 1. No acute abnormality seen to explain the patient's symptoms. 2. Focal lucencies noted at multiple levels along the lumbar spine, the largest of which is seen at L2. This is concerning for metastatic disease or multiple myeloma. Bone scan would be helpful for further evaluation. 3. Scattered aortic atherosclerosis. 4. Calcification at the pancreatic head is somewhat more prominent than in 2014, reflecting chronic sequelae of pancreatitis. 5. Scattered bilateral renal cysts noted. 6. Mild chronic compression deformity of vertebral body T12. 7. Stable calcification at the fundus of the gallbladder. Gallbladder otherwise unremarkable. 8. 6 mm nonspecific hypodensity at the right hepatic lobe is grossly stable and likely benign. Electronically Signed   By: Garald Balding M.D.   On: 10/01/2016 22:48    Procedures Procedures (including critical care time)  Medications Ordered in ED Medications  iopamidol (ISOVUE-300) 61 % injection (not administered)  sodium chloride 0.9 % bolus 1,000 mL (0 mLs Intravenous Stopped 10/01/16 2302)  iopamidol (ISOVUE-300) 61 % injection 100 mL (100 mLs Intravenous Contrast Given 10/01/16  2210)     Initial Impression / Assessment and Plan / ED Course  I have reviewed the triage vital signs and the nursing notes.  Pertinent labs & imaging results that were available during my care of the patient were reviewed by me and considered in my medical decision making (see chart for details).  11:24 PM On repeat exam patient is awake and alert, in no distress, speaking clearly. I reviewed the CT findings, labs with patient at length, specifically discussed the absence of evidence for bowel obstruction, and discussed the likelihood of her back is contributing to her ongoing pain (and requires ongoing eval w PMD). With no evidence for obstruction, though this for peritonitis, unremarkable vital signs, patient was started on  a stool softener regimen, enemas at home, and be discharged in stable condition.  Final Clinical Impressions(s) / ED Diagnoses  Abdominal pain lower Constipation Back pain   Carmin Muskrat, MD 10/01/16 2325    Carmin Muskrat, MD 10/01/16 [phone]

## 2016-10-01 NOTE — ED Triage Notes (Signed)
Pt BIB by [facility] from home c/o back pain and abdominal pain. Pt has a hx of stomach ulcers and back pain. She is requesting pain management. A&Ox4. Pt able to ambulate with assistance.

## 2016-10-04 ENCOUNTER — Emergency Department (HOSPITAL_COMMUNITY)
Admission: EM | Admit: 2016-10-04 | Discharge: 2016-10-04 | Disposition: A | Discharge status: Home / Self Care | Payer: Medicare Other | Attending: Emergency Medicine | Admitting: Emergency Medicine

## 2016-10-04 ENCOUNTER — Emergency Department (HOSPITAL_COMMUNITY): Payer: Medicare Other

## 2016-10-04 ENCOUNTER — Encounter (HOSPITAL_COMMUNITY): Payer: Self-pay

## 2016-10-04 DIAGNOSIS — R1084 Generalized abdominal pain: Secondary | ICD-10-CM | POA: Insufficient documentation

## 2016-10-04 DIAGNOSIS — F17200 Nicotine dependence, unspecified, uncomplicated: Secondary | ICD-10-CM | POA: Insufficient documentation

## 2016-10-04 DIAGNOSIS — M7918 Myalgia, other site: Secondary | ICD-10-CM

## 2016-10-04 DIAGNOSIS — M791 Myalgia: Secondary | ICD-10-CM | POA: Insufficient documentation

## 2016-10-04 DIAGNOSIS — Z79899 Other long term (current) drug therapy: Secondary | ICD-10-CM | POA: Diagnosis not present

## 2016-10-04 DIAGNOSIS — Z853 Personal history of malignant neoplasm of breast: Secondary | ICD-10-CM | POA: Diagnosis not present

## 2016-10-04 DIAGNOSIS — I10 Essential (primary) hypertension: Secondary | ICD-10-CM | POA: Diagnosis not present

## 2016-10-04 DIAGNOSIS — E119 Type 2 diabetes mellitus without complications: Secondary | ICD-10-CM | POA: Diagnosis not present

## 2016-10-04 LAB — CBC WITH DIFFERENTIAL/PLATELET
Basophils Absolute: 0 10*3/uL (ref 0.0–0.1)
Basophils Relative: 0 %
Eosinophils Absolute: 0.1 10*3/uL (ref 0.0–0.7)
Eosinophils Relative: 1 %
HEMATOCRIT: 41.6 % (ref 36.0–46.0)
HEMOGLOBIN: 13.7 g/dL (ref 12.0–15.0)
Lymphocytes Relative: 20 %
Lymphs Abs: 3.2 10*3/uL (ref 0.7–4.0)
MCH: 24.6 pg — ABNORMAL LOW (ref 26.0–34.0)
MCHC: 32.9 g/dL (ref 30.0–36.0)
MCV: 74.8 fL — ABNORMAL LOW (ref 78.0–100.0)
MONOS PCT: 4 %
Monocytes Absolute: 0.7 10*3/uL (ref 0.1–1.0)
NEUTROS ABS: 11.9 10*3/uL — AB (ref 1.7–7.7)
NEUTROS PCT: 75 %
Platelets: 438 10*3/uL — ABNORMAL HIGH (ref 150–400)
RBC: 5.56 MIL/uL — AB (ref 3.87–5.11)
RDW: 19.8 % — ABNORMAL HIGH (ref 11.5–15.5)
WBC: 15.9 10*3/uL — AB (ref 4.0–10.5)

## 2016-10-04 LAB — COMPREHENSIVE METABOLIC PANEL
ALK PHOS: 139 U/L — AB (ref 38–126)
ALT: 13 U/L — AB (ref 14–54)
ANION GAP: 11 (ref 5–15)
AST: 17 U/L (ref 15–41)
Albumin: 3.9 g/dL (ref 3.5–5.0)
BILIRUBIN TOTAL: 0.6 mg/dL (ref 0.3–1.2)
BUN: 6 mg/dL (ref 6–20)
CALCIUM: 9.8 mg/dL (ref 8.9–10.3)
CO2: 18 mmol/L — ABNORMAL LOW (ref 22–32)
CREATININE: 0.64 mg/dL (ref 0.44–1.00)
Chloride: 111 mmol/L (ref 101–111)
GFR calc Af Amer: >60 mL/min (ref >60)
GFR calc non Af Amer: >60 mL/min (ref >60)
GLUCOSE: 99 mg/dL (ref 65–99)
Potassium: 3.7 mmol/L (ref 3.5–5.1)
Sodium: 140 mmol/L (ref 135–145)
TOTAL PROTEIN: 7.3 g/dL (ref 6.5–8.1)

## 2016-10-04 LAB — I-STAT TROPONIN, ED: Troponin i, poc: 0 ng/mL (ref 0.00–0.08)

## 2016-10-04 LAB — LIPASE, BLOOD: Lipase: 81 U/L — ABNORMAL HIGH (ref 11–51)

## 2016-10-04 MED ORDER — ONDANSETRON HCL 4 MG/2ML IJ SOLN
4.0000 mg | Freq: Once | INTRAMUSCULAR | Status: AC
  Administered 2016-10-04: 4 mg via INTRAVENOUS
  Filled 2016-10-04: qty 2

## 2016-10-04 MED ORDER — MORPHINE SULFATE (PF) 4 MG/ML IV SOLN
4.0000 mg | Freq: Once | INTRAVENOUS | Status: AC
  Administered 2016-10-04: 4 mg via INTRAVENOUS
  Filled 2016-10-04: qty 1

## 2016-10-04 MED ORDER — SODIUM CHLORIDE 0.9 % IV BOLUS (SEPSIS)
1000.0000 mL | Freq: Once | INTRAVENOUS | Status: AC
  Administered 2016-10-04: 1000 mL via INTRAVENOUS

## 2016-10-04 NOTE — ED Provider Notes (Deleted)
[facility] DEPT Provider Note   CSN: [id number] Arrival date & time: 10/04/16  1212     History   Chief Complaint Chief Complaint  Patient presents with  . flank/back pain    HPI Emily Cohen is a 66 y.o. female.  HPI 1 month Past Medical History:  Diagnosis Date  . Alcohol use   . Arthritis   . Blindness   . Breast cancer, left breast ([place])   . Cancer ([place])   . Diabetes mellitus   . Hypertension     Patient Active Problem List   Diagnosis Date Noted  . Chest pain 08/24/2016  . Respiratory failure ([place])   . Acute post-hemorrhagic anemia   . Alcohol abuse   . Acute upper GI bleed 05/22/2016    Past Surgical History:  Procedure Laterality Date  . ESOPHAGOGASTRODUODENOSCOPY N/A 05/23/2016   Procedure: ESOPHAGOGASTRODUODENOSCOPY (EGD);  Surgeon: Milus Banister, MD;  Location: [facility];  Service: Endoscopy;  Laterality: N/A;    OB History    No data available       Home Medications    Prior to Admission medications   Medication Sig Start Date End Date Taking? Authorizing Provider  acetaminophen (TYLENOL) 500 MG tablet Take 1 tablet (500 mg total) by mouth every 6 (six) hours as needed. Patient taking differently: Take 500 mg by mouth every 6 (six) hours as needed for mild pain, moderate pain, fever or headache.  08/25/16   Arrien, Jimmy Picket, MD  amLODipine (NORVASC) 10 MG tablet Take 10 mg by mouth daily.    [provider]  bisacodyl (DULCOLAX) 5 MG EC tablet Take 1 tablet (5 mg total) by mouth 2 (two) times daily. 10/01/16   Carmin Muskrat, MD  diclofenac sodium (VOLTAREN) 1 % GEL Apply 2 g topically 4 (four) times daily. Apply to right shoulder. Patient taking differently: Apply 2 g topically 4 (four) times daily as needed (for right shoulder pain).  08/25/16   Arrien, Jimmy Picket, MD  HYDROcodone-acetaminophen (NORCO/VICODIN) 5-325 MG tablet Take 1 tablet by mouth every 6 (six) hours as needed for severe pain. 10/01/16    Carmin Muskrat, MD  lovastatin (MEVACOR) 20 MG tablet Take 20 mg by mouth daily.    [provider]  sodium phosphate (FLEET) 7-19 GM/118ML ENEM Place 133 mLs (1 enema total) rectally daily as needed for severe constipation. 10/01/16   Carmin Muskrat, MD    Family History No family history on file.  Social History Social History  Substance Use Topics  . Smoking status: Current Every Day Smoker  . Smokeless tobacco: Never Used  . Alcohol use Yes     Comment: heavy drinker     Allergies   Patient has no known allergies.   Review of Systems Review of Systems   Physical Exam Updated Vital Signs BP 115/68   Pulse 89   Temp 98.7 F (37.1 C) (Oral)   Resp 12   Ht '5\' 7"'  (1.702 m)   Wt 67.1 kg (148 lb)   SpO2 99%   BMI 23.18 kg/m   Physical Exam   ED Treatments / Results  Labs (all labs ordered are listed, but only abnormal results are displayed) Labs Reviewed  CBC WITH DIFFERENTIAL/PLATELET - Abnormal; Notable for the following:       Result Value   WBC 15.9 (*)    RBC 5.56 (*)    MCV 74.8 (*)    MCH 24.6 (*)    RDW 19.8 (*)  Platelets 438 (*)    Neutro Abs 11.9 (*)    All other components within normal limits  COMPREHENSIVE METABOLIC PANEL - Abnormal; Notable for the following:    CO2 18 (*)    ALT 13 (*)    Alkaline Phosphatase 139 (*)    All other components within normal limits  LIPASE, BLOOD - Abnormal; Notable for the following:    Lipase 81 (*)    All other components within normal limits  I-STAT TROPOININ, ED    EKG  EKG Interpretation  Date/Time:  Sunday October 04 2016 14:10:22 EDT Ventricular Rate:  98 PR Interval:    QRS Duration: 102 QT Interval:  362 QTC Calculation: 463 R Axis:   -61 Text Interpretation:  Sinus rhythm Probable left atrial enlargement 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 [facility] in January 2018 and injured left humerus.) No new fall, says patient. EXAM: LEFT RIBS AND CHEST - 3+ VIEW COMPARISON:  None. FINDINGS: No rib fracture or rib lesion. Heart, mediastinum and hila are unremarkable. Lungs are clear. No pleural effusion or pneumothorax. IMPRESSION: Negative. Electronically Signed   By: Lajean Manes M.D.   On: 10/04/2016 16:01    Procedures Procedures (including critical care time)  Medications Ordered in ED Medications  morphine 4 MG/ML injection 4 mg (4 mg Intravenous Given 10/04/16 1405)  ondansetron (ZOFRAN) injection 4 mg (4 mg Intravenous Given 10/04/16 1405)  sodium chloride 0.9 % bolus 1,000 mL (0 mLs Intravenous Stopped 10/04/16 1527)     Initial Impression / Assessment and Plan / ED Course  I have reviewed the triage vital signs and the nursing notes.  Pertinent labs & imaging results that were available during my care of the patient were reviewed by me and considered in my medical decision making (see chart for details).      I reviewed the available records in EPIC. Seen in ED 2 days ago for abdominal pain and constipation. With Ct abd/pelvis showing no etiology of her symptoms and no acute Intra-abdominal processes. There were incidental lucencies found in her lumbar spine that could be concerning for possible metastatic disease versus multiple myeloma. She is not having low back pain today, but these findings were relayed to her, and she will follow-up with her PCP closely to undergo any further workup.  Pain today seems very musculoskeletal. Tenderness to palpation around the left lower ribs laterally and posteriorly, and worse with movement. Her abdomen is soft and benign. No tenderness currently. Workup  overall very reassuring. Do not feel at this atypical ACS presentation, PE, dissection or other serious cause. Discussed that she must follow-up with PCP for ongoing pain control. Strict return and follow-up instructions reviewed. She expressed understanding of all discharge instructions and felt comfortable with the plan of care.    Final Clinical Impressions(s) / ED Diagnoses   Final diagnoses:  Musculoskeletal pain    New Prescriptions New Prescriptions   No medications on file     Forde Dandy, MD 10/04/16 1651

## 2016-10-04 NOTE — ED Notes (Signed)
Attempted to get a urine specimen but pt stooled in urine.

## 2016-10-04 NOTE — ED Triage Notes (Signed)
Patient arrived by [facility] for ongoing left flank and back pain. Patient crying with any movement. Seen Thursday for same and all test negative. Pain worse with ROM

## 2016-10-04 NOTE — ED Notes (Signed)
Patient transported to X-ray 

## 2016-10-04 NOTE — ED Provider Notes (Signed)
[facility] DEPT Provider Note   CSN: [id number] Arrival date & time: 10/04/16  1212     History   Chief Complaint Chief Complaint  Patient presents with  . flank/back pain    HPI Emily Cohen is a 66 y.o. female.   Back Pain   This is a new problem. Episode onset: 1 month. The problem occurs constantly. The problem has not changed since onset.The pain is associated with no known injury. Pain location: left lower posterior rib cage. The quality of the pain is described as stabbing. The pain does not radiate. The pain is moderate. The symptoms are aggravated by bending, twisting and certain positions. The pain is the same all the time. Pertinent negatives include no fever, no weight loss, no abdominal pain, no bowel incontinence, no bladder incontinence, no leg pain, no paresthesias, no paresis, no tingling and no weakness. She has tried analgesics for the symptoms. The treatment provided no relief. Risk factors include a history of cancer.   66 year old female who presents with mid back pain. She has a history of breast cancer, now in remission, diabetes and hypertension. Reports 1 month of left posterior rib pain and left lateral rib pain, worse with movement, palpation. Denies any injury, exertion or heavy lifting. Denies cough, difficulty breathing, fevers or chills. Reports that she was seen in the ED 2 days ago for constipation and now having loose stools after giving herself 2 enemas and taking laxatives. Currently denies any abdominal pain. No dysuria, urinary frequency, hematuria.    Past Medical History:  Diagnosis Date  . Alcohol use   . Arthritis   . Blindness   . Breast cancer, left breast ([place])   . Cancer ([place])   . Diabetes mellitus   . Hypertension     Patient Active Problem List   Diagnosis Date Noted  . Chest pain 08/24/2016  . Respiratory failure ([place])   . Acute post-hemorrhagic anemia   . Alcohol abuse   . Acute upper GI bleed 05/22/2016     Past Surgical History:  Procedure Laterality Date  . ESOPHAGOGASTRODUODENOSCOPY N/A 05/23/2016   Procedure: ESOPHAGOGASTRODUODENOSCOPY (EGD);  Surgeon: Milus Banister, MD;  Location: [facility];  Service: Endoscopy;  Laterality: N/A;    OB History    No data available       Home Medications    Prior to Admission medications   Medication Sig Start Date End Date Taking? Authorizing Provider  acetaminophen (TYLENOL) 500 MG tablet Take 1 tablet (500 mg total) by mouth every 6 (six) hours as needed. Patient taking differently: Take 500 mg by mouth every 6 (six) hours as needed for mild pain, moderate pain, fever or headache.  08/25/16   Arrien, Jimmy Picket, MD  amLODipine (NORVASC) 10 MG tablet Take 10 mg by mouth daily.    [provider]  bisacodyl (DULCOLAX) 5 MG EC tablet Take 1 tablet (5 mg total) by mouth 2 (two) times daily. 10/01/16   Carmin Muskrat, MD  diclofenac sodium (VOLTAREN) 1 % GEL Apply 2 g topically 4 (four) times daily. Apply to right shoulder. Patient taking differently: Apply 2 g topically 4 (four) times daily as needed (for right shoulder pain).  08/25/16   Arrien, Jimmy Picket, MD  HYDROcodone-acetaminophen (NORCO/VICODIN) 5-325 MG tablet Take 1 tablet by mouth every 6 (six) hours as needed for severe pain. 10/01/16   Carmin Muskrat, MD  lovastatin (MEVACOR) 20 MG tablet Take 20 mg by mouth daily.    [provider]  sodium phosphate (FLEET) 7-19 GM/118ML ENEM Place 133 mLs (1 enema total) rectally daily as needed for severe constipation. 10/01/16   Gerhard Munch, MD    Family History No family history on file.  Social History Social History  Substance Use Topics  . Smoking status: Current Every Day Smoker  . Smokeless tobacco: Never Used  . Alcohol use Yes     Comment: heavy drinker     Allergies   Patient has no known allergies.   Review of Systems Review of Systems  Constitutional: Negative for fever and weight  loss.  Gastrointestinal: Negative for abdominal pain and bowel incontinence.  Genitourinary: Negative for bladder incontinence.  Musculoskeletal: Positive for back pain.  Neurological: Negative for tingling, weakness and paresthesias.  All other systems reviewed and are negative.    Physical Exam Updated Vital Signs BP 115/68   Pulse 89   Temp 98.7 F (37.1 C) (Oral)   Resp 12   Ht 5\' 7"  (1.702 m)   Wt 67.1 kg (148 lb)   SpO2 99%   BMI 23.18 kg/m   Physical Exam Physical Exam  Nursing note and vitals reviewed. Constitutional: Well developed, well nourished, non-toxic, and in no acute distress Head: Normocephalic and atraumatic.  Mouth/Throat: Oropharynx is clear and moist.  Neck: Normal range of motion. Neck supple.  Cardiovascular: Normal rate and regular rhythm.   Pulmonary/Chest: Effort normal and breath sounds normal. Left lower lateral and posterior rib pain to palpation Abdominal: Soft. There is no tenderness. There is no rebound and no guarding.  Musculoskeletal: Normal range of motion. no deformities. No TLS spine tenderness. Neurological: Alert, no facial droop, fluent speech, moves all extremities symmetrically Skin: Skin is warm and dry.  Psychiatric: Cooperative   ED Treatments / Results  Labs (all labs ordered are listed, but only abnormal results are displayed) Labs Reviewed  CBC WITH DIFFERENTIAL/PLATELET - Abnormal; Notable for the following:       Result Value   WBC 15.9 (*)    RBC 5.56 (*)    MCV 74.8 (*)    MCH 24.6 (*)    RDW 19.8 (*)    Platelets 438 (*)    Neutro Abs 11.9 (*)    All other components within normal limits  COMPREHENSIVE METABOLIC PANEL - Abnormal; Notable for the following:    CO2 18 (*)    ALT 13 (*)    Alkaline Phosphatase 139 (*)    All other components within normal limits  LIPASE, BLOOD - Abnormal; Notable for the following:    Lipase 81 (*)    All other components within normal limits  I-STAT TROPOININ, ED     EKG  EKG Interpretation  Date/Time:  Sunday October 04 2016 14:10:22 EDT Ventricular Rate:  98 PR Interval:    QRS Duration: 102 QT Interval:  362 QTC Calculation: 463 R Axis:   -61 Text Interpretation:  Sinus rhythm Probable left atrial enlargement 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 10/06/2016 in January 2018 and injured left humerus.) No new fall, says patient. EXAM: LEFT RIBS AND CHEST - 3+ VIEW COMPARISON:  None. FINDINGS: No  rib fracture or rib lesion. Heart, mediastinum and hila are unremarkable. Lungs are clear. No pleural effusion or pneumothorax. IMPRESSION: Negative. Electronically Signed   By: Lajean Manes M.D.   On: 10/04/2016 16:01    Procedures Procedures (including critical care time)  Medications Ordered in ED Medications  morphine 4 MG/ML injection 4 mg (4 mg Intravenous Given 10/04/16 1405)  ondansetron (ZOFRAN) injection 4 mg (4 mg Intravenous Given 10/04/16 1405)  sodium chloride 0.9 % bolus 1,000 mL (0 mLs Intravenous Stopped 10/04/16 1527)     Initial Impression / Assessment and Plan / ED Course  I have reviewed the triage vital signs and the nursing notes.  Pertinent labs & imaging results that were available during my care of the patient were reviewed by me and considered in my medical decision making (see chart for details).     I reviewed the available records in EPIC. Seen in ED 2 days ago for abdominal pain and constipation. With Ct abd/pelvis showing no etiology of her symptoms and no acute Intra-abdominal processes. There were incidental lucencies found in her lumbar spine that  could be concerning for possible metastatic disease versus multiple myeloma. She is not having low back pain today, but these findings were relayed to her, and she will follow-up with her PCP closely to undergo any further workup.  Pain today seems very musculoskeletal. Tenderness to palpation around the left lower ribs laterally and posteriorly, and worse with movement. Her abdomen is soft and benign. No tenderness currently. Workup overall very reassuring. Do not feel at this atypical ACS presentation, PE, dissection or other serious cause. Discussed that she must follow-up with PCP for ongoing pain control. Strict return and follow-up instructions reviewed. She expressed understanding of all discharge instructions and felt comfortable with the plan of care.  Final Clinical Impressions(s) / ED Diagnoses   Final diagnoses:  Musculoskeletal pain    New Prescriptions New Prescriptions   No medications on file     Forde Dandy, MD 10/04/16 1657

## 2016-10-04 NOTE — Discharge Instructions (Signed)
Your pain today seems consistent with musculoskeletal pain. Take your home hydrocodone. Follow-up with your PCP regarding ongoing pain management.  On your recent CT scan there were also lesions seen in your low back. You will need work-up for this. Please discuss this with your primary care doctor and you may need referral to oncology for further work-up.  Return for worsening symptoms.

## 2016-10-07 ENCOUNTER — Observation Stay (HOSPITAL_COMMUNITY): Payer: Medicare Other

## 2016-10-07 ENCOUNTER — Inpatient Hospital Stay (HOSPITAL_COMMUNITY)
Admission: EM | Admit: 2016-10-07 | Discharge: 2016-10-09 | DRG: 394 | Disposition: A | Discharge status: Home / Self Care | Payer: Medicare Other | Attending: Internal Medicine | Admitting: Internal Medicine

## 2016-10-07 ENCOUNTER — Encounter (HOSPITAL_COMMUNITY): Payer: Self-pay

## 2016-10-07 DIAGNOSIS — K5909 Other constipation: Secondary | ICD-10-CM | POA: Diagnosis present

## 2016-10-07 DIAGNOSIS — K5900 Constipation, unspecified: Secondary | ICD-10-CM

## 2016-10-07 DIAGNOSIS — K625 Hemorrhage of anus and rectum: Secondary | ICD-10-CM

## 2016-10-07 DIAGNOSIS — E119 Type 2 diabetes mellitus without complications: Secondary | ICD-10-CM | POA: Diagnosis present

## 2016-10-07 DIAGNOSIS — F17210 Nicotine dependence, cigarettes, uncomplicated: Secondary | ICD-10-CM | POA: Diagnosis present

## 2016-10-07 DIAGNOSIS — D509 Iron deficiency anemia, unspecified: Secondary | ICD-10-CM | POA: Diagnosis not present

## 2016-10-07 DIAGNOSIS — Z8719 Personal history of other diseases of the digestive system: Secondary | ICD-10-CM | POA: Insufficient documentation

## 2016-10-07 DIAGNOSIS — D649 Anemia, unspecified: Secondary | ICD-10-CM | POA: Diagnosis not present

## 2016-10-07 DIAGNOSIS — E785 Hyperlipidemia, unspecified: Secondary | ICD-10-CM | POA: Diagnosis not present

## 2016-10-07 DIAGNOSIS — K860 Alcohol-induced chronic pancreatitis: Secondary | ICD-10-CM | POA: Insufficient documentation

## 2016-10-07 DIAGNOSIS — K8689 Other specified diseases of pancreas: Secondary | ICD-10-CM | POA: Diagnosis present

## 2016-10-07 DIAGNOSIS — Z789 Other specified health status: Secondary | ICD-10-CM | POA: Diagnosis not present

## 2016-10-07 DIAGNOSIS — Z79899 Other long term (current) drug therapy: Secondary | ICD-10-CM

## 2016-10-07 DIAGNOSIS — G8929 Other chronic pain: Secondary | ICD-10-CM

## 2016-10-07 DIAGNOSIS — M519 Unspecified thoracic, thoracolumbar and lumbosacral intervertebral disc disorder: Secondary | ICD-10-CM

## 2016-10-07 DIAGNOSIS — K5730 Diverticulosis of large intestine without perforation or abscess without bleeding: Secondary | ICD-10-CM | POA: Diagnosis not present

## 2016-10-07 DIAGNOSIS — Q393 Congenital stenosis and stricture of esophagus: Secondary | ICD-10-CM

## 2016-10-07 DIAGNOSIS — Z122 Encounter for screening for malignant neoplasm of respiratory organs: Secondary | ICD-10-CM

## 2016-10-07 DIAGNOSIS — M549 Dorsalgia, unspecified: Secondary | ICD-10-CM

## 2016-10-07 DIAGNOSIS — C3490 Malignant neoplasm of unspecified part of unspecified bronchus or lung: Secondary | ICD-10-CM

## 2016-10-07 DIAGNOSIS — I10 Essential (primary) hypertension: Secondary | ICD-10-CM | POA: Diagnosis not present

## 2016-10-07 DIAGNOSIS — K219 Gastro-esophageal reflux disease without esophagitis: Secondary | ICD-10-CM | POA: Diagnosis present

## 2016-10-07 DIAGNOSIS — D123 Benign neoplasm of transverse colon: Secondary | ICD-10-CM | POA: Diagnosis not present

## 2016-10-07 DIAGNOSIS — J40 Bronchitis, not specified as acute or chronic: Secondary | ICD-10-CM

## 2016-10-07 DIAGNOSIS — K222 Esophageal obstruction: Secondary | ICD-10-CM | POA: Diagnosis present

## 2016-10-07 DIAGNOSIS — K861 Other chronic pancreatitis: Secondary | ICD-10-CM | POA: Diagnosis present

## 2016-10-07 DIAGNOSIS — E876 Hypokalemia: Secondary | ICD-10-CM | POA: Diagnosis present

## 2016-10-07 DIAGNOSIS — Z853 Personal history of malignant neoplasm of breast: Secondary | ICD-10-CM | POA: Diagnosis not present

## 2016-10-07 DIAGNOSIS — R42 Dizziness and giddiness: Secondary | ICD-10-CM

## 2016-10-07 DIAGNOSIS — M1990 Unspecified osteoarthritis, unspecified site: Secondary | ICD-10-CM | POA: Diagnosis present

## 2016-10-07 DIAGNOSIS — R935 Abnormal findings on diagnostic imaging of other abdominal regions, including retroperitoneum: Secondary | ICD-10-CM

## 2016-10-07 DIAGNOSIS — D122 Benign neoplasm of ascending colon: Secondary | ICD-10-CM | POA: Diagnosis present

## 2016-10-07 DIAGNOSIS — H547 Unspecified visual loss: Secondary | ICD-10-CM | POA: Diagnosis present

## 2016-10-07 DIAGNOSIS — K254 Chronic or unspecified gastric ulcer with hemorrhage: Secondary | ICD-10-CM | POA: Diagnosis not present

## 2016-10-07 DIAGNOSIS — Z720 Tobacco use: Secondary | ICD-10-CM

## 2016-10-07 DIAGNOSIS — M545 Low back pain: Secondary | ICD-10-CM | POA: Diagnosis not present

## 2016-10-07 DIAGNOSIS — R1084 Generalized abdominal pain: Secondary | ICD-10-CM

## 2016-10-07 DIAGNOSIS — K648 Other hemorrhoids: Principal | ICD-10-CM | POA: Diagnosis present

## 2016-10-07 DIAGNOSIS — K279 Peptic ulcer, site unspecified, unspecified as acute or chronic, without hemorrhage or perforation: Secondary | ICD-10-CM | POA: Diagnosis present

## 2016-10-07 DIAGNOSIS — R1032 Left lower quadrant pain: Secondary | ICD-10-CM

## 2016-10-07 DIAGNOSIS — Z8711 Personal history of peptic ulcer disease: Secondary | ICD-10-CM | POA: Insufficient documentation

## 2016-10-07 DIAGNOSIS — J410 Simple chronic bronchitis: Secondary | ICD-10-CM | POA: Diagnosis present

## 2016-10-07 DIAGNOSIS — Z7289 Other problems related to lifestyle: Secondary | ICD-10-CM | POA: Diagnosis present

## 2016-10-07 DIAGNOSIS — E860 Dehydration: Secondary | ICD-10-CM | POA: Diagnosis not present

## 2016-10-07 DIAGNOSIS — F1021 Alcohol dependence, in remission: Secondary | ICD-10-CM | POA: Diagnosis present

## 2016-10-07 HISTORY — DX: Dorsalgia, unspecified: M54.9

## 2016-10-07 HISTORY — DX: Unspecified fracture of shaft of humerus, left arm, initial encounter for closed fracture: S42.302A

## 2016-10-07 HISTORY — DX: Depression, unspecified: F32.A

## 2016-10-07 HISTORY — DX: Acute pancreatitis without necrosis or infection, unspecified: K85.90

## 2016-10-07 HISTORY — DX: Gastro-esophageal reflux disease without esophagitis: K21.9

## 2016-10-07 HISTORY — DX: Prediabetes: R73.03

## 2016-10-07 HISTORY — DX: Gastric ulcer, unspecified as acute or chronic, without hemorrhage or perforation: K25.9

## 2016-10-07 HISTORY — DX: Anxiety disorder, unspecified: F41.9

## 2016-10-07 HISTORY — DX: Other chronic pain: G89.29

## 2016-10-07 HISTORY — DX: Alcohol abuse, uncomplicated: F10.10

## 2016-10-07 HISTORY — DX: Major depressive disorder, single episode, unspecified: F32.9

## 2016-10-07 LAB — CBC
HCT: 35.2 % — ABNORMAL LOW (ref 36.0–46.0)
Hemoglobin: 11.9 g/dL — ABNORMAL LOW (ref 12.0–15.0)
MCH: 24.7 pg — AB (ref 26.0–34.0)
MCHC: 33.8 g/dL (ref 30.0–36.0)
MCV: 73 fL — ABNORMAL LOW (ref 78.0–100.0)
PLATELETS: 467 10*3/uL — AB (ref 150–400)
RBC: 4.82 MIL/uL (ref 3.87–5.11)
RDW: 19.5 % — AB (ref 11.5–15.5)
WBC: 10.9 10*3/uL — ABNORMAL HIGH (ref 4.0–10.5)

## 2016-10-07 LAB — RAPID URINE DRUG SCREEN, HOSP PERFORMED
Amphetamines: NOT DETECTED
BARBITURATES: NOT DETECTED
Benzodiazepines: NOT DETECTED
Cocaine: NOT DETECTED
Opiates: POSITIVE — AB
TETRAHYDROCANNABINOL: NOT DETECTED

## 2016-10-07 LAB — TYPE AND SCREEN
ABO/RH(D): B POS
ANTIBODY SCREEN: NEGATIVE

## 2016-10-07 LAB — COMPREHENSIVE METABOLIC PANEL
ALT: 8 U/L — AB (ref 14–54)
ANION GAP: 10 (ref 5–15)
AST: 19 U/L (ref 15–41)
Albumin: 3.5 g/dL (ref 3.5–5.0)
Alkaline Phosphatase: 140 U/L — ABNORMAL HIGH (ref 38–126)
BUN: 9 mg/dL (ref 6–20)
CHLORIDE: 104 mmol/L (ref 101–111)
CO2: 24 mmol/L (ref 22–32)
Calcium: 9.6 mg/dL (ref 8.9–10.3)
Creatinine, Ser: 0.65 mg/dL (ref 0.44–1.00)
GFR calc non Af Amer: >60 mL/min (ref >60)
Glucose, Bld: 108 mg/dL — ABNORMAL HIGH (ref 65–99)
Potassium: 4.2 mmol/L (ref 3.5–5.1)
Sodium: 138 mmol/L (ref 135–145)
Total Bilirubin: 1.5 mg/dL — ABNORMAL HIGH (ref 0.3–1.2)
Total Protein: 6.6 g/dL (ref 6.5–8.1)

## 2016-10-07 LAB — LIPASE, BLOOD: LIPASE: 32 U/L (ref 11–51)

## 2016-10-07 LAB — ETHANOL: Alcohol, Ethyl (B): <5 mg/dL (ref <5)

## 2016-10-07 LAB — PROTIME-INR
INR: 1.04
Prothrombin Time: 13.6 seconds (ref 11.4–15.2)

## 2016-10-07 MED ORDER — ACETAMINOPHEN 325 MG PO TABS
650.0000 mg | ORAL_TABLET | Freq: Four times a day (QID) | ORAL | Status: DC | PRN
  Administered 2016-10-08: 650 mg via ORAL

## 2016-10-07 MED ORDER — ACETAMINOPHEN 650 MG RE SUPP: 650.0000 mg | Freq: Four times a day (QID) | RECTAL | Status: DC | PRN

## 2016-10-07 MED ORDER — PROMETHAZINE HCL 25 MG/ML IJ SOLN: 12.5000 mg | Freq: Four times a day (QID) | INTRAMUSCULAR | Status: DC | PRN

## 2016-10-07 MED ORDER — PEG-KCL-NACL-NASULF-NA ASC-C 100 G PO SOLR
1.0000 | Freq: Once | ORAL | Status: DC
  Filled 2016-10-07: qty 1

## 2016-10-07 MED ORDER — PEG-KCL-NACL-NASULF-NA ASC-C 100 G PO SOLR
0.5000 | Freq: Once | ORAL | Status: AC
  Administered 2016-10-08: 100 g via ORAL
  Filled 2016-10-07: qty 1

## 2016-10-07 MED ORDER — VENLAFAXINE HCL ER 75 MG PO CP24
75.0000 mg | ORAL_CAPSULE | Freq: Every day | ORAL | Status: DC
  Administered 2016-10-08 – 2016-10-09 (×2): 75 mg via ORAL
  Filled 2016-10-07 (×2): qty 1

## 2016-10-07 MED ORDER — SUCRALFATE 1 GM/10ML PO SUSP
1.0000 g | Freq: Four times a day (QID) | ORAL | Status: DC
  Administered 2016-10-07 – 2016-10-09 (×8): 1 g via ORAL
  Filled 2016-10-07 (×7): qty 10

## 2016-10-07 MED ORDER — FAMOTIDINE IN NACL 20-0.9 MG/50ML-% IV SOLN
20.0000 mg | Freq: Once | INTRAVENOUS | Status: AC
  Administered 2016-10-07: 20 mg via INTRAVENOUS
  Filled 2016-10-07: qty 50

## 2016-10-07 MED ORDER — PANTOPRAZOLE SODIUM 40 MG IV SOLR
40.0000 mg | Freq: Two times a day (BID) | INTRAVENOUS | Status: DC
  Administered 2016-10-07: 40 mg via INTRAVENOUS
  Filled 2016-10-07: qty 40

## 2016-10-07 MED ORDER — PANTOPRAZOLE SODIUM 40 MG PO TBEC
40.0000 mg | DELAYED_RELEASE_TABLET | Freq: Every day | ORAL | Status: DC
  Administered 2016-10-08 – 2016-10-09 (×2): 40 mg via ORAL
  Filled 2016-10-07 (×2): qty 1

## 2016-10-07 MED ORDER — ONDANSETRON HCL 4 MG/2ML IJ SOLN
4.0000 mg | Freq: Four times a day (QID) | INTRAMUSCULAR | Status: DC | PRN
  Filled 2016-10-07: qty 2

## 2016-10-07 MED ORDER — MORPHINE SULFATE (PF) 4 MG/ML IV SOLN
1.0000 mg | INTRAVENOUS | Status: DC | PRN
  Administered 2016-10-07 – 2016-10-09 (×6): 1 mg via INTRAVENOUS
  Filled 2016-10-07 (×7): qty 1

## 2016-10-07 MED ORDER — TRAZODONE HCL 50 MG PO TABS
50.0000 mg | ORAL_TABLET | Freq: Every day | ORAL | Status: DC
  Administered 2016-10-07 – 2016-10-08 (×2): 50 mg via ORAL
  Filled 2016-10-07 (×2): qty 1

## 2016-10-07 MED ORDER — BISACODYL 5 MG PO TBEC
10.0000 mg | DELAYED_RELEASE_TABLET | Freq: Once | ORAL | Status: AC
  Administered 2016-10-07: 10 mg via ORAL
  Filled 2016-10-07 (×2): qty 2

## 2016-10-07 MED ORDER — METOCLOPRAMIDE HCL 5 MG/ML IJ SOLN
10.0000 mg | Freq: Once | INTRAMUSCULAR | Status: AC
  Administered 2016-10-07: 10 mg via INTRAVENOUS
  Filled 2016-10-07: qty 2

## 2016-10-07 MED ORDER — TRAMADOL HCL 50 MG PO TABS
50.0000 mg | ORAL_TABLET | Freq: Three times a day (TID) | ORAL | Status: DC
  Administered 2016-10-07 – 2016-10-09 (×6): 50 mg via ORAL
  Filled 2016-10-07 (×6): qty 1

## 2016-10-07 MED ORDER — PEG-KCL-NACL-NASULF-NA ASC-C 100 G PO SOLR
0.5000 | Freq: Once | ORAL | Status: AC
Start: 2016-10-07 — End: 2016-10-07
  Administered 2016-10-07: 100 g via ORAL
  Filled 2016-10-07: qty 1

## 2016-10-07 MED ORDER — METOCLOPRAMIDE HCL 5 MG/ML IJ SOLN
10.0000 mg | Freq: Once | INTRAMUSCULAR | Status: AC
Start: 2016-10-08 — End: 2016-10-08
  Administered 2016-10-08: 10 mg via INTRAVENOUS
  Filled 2016-10-07: qty 2

## 2016-10-07 MED ORDER — SODIUM CHLORIDE 0.9 % IV SOLN
INTRAVENOUS | Status: DC
  Administered 2016-10-07 – 2016-10-09 (×4): via INTRAVENOUS

## 2016-10-07 NOTE — ED Notes (Signed)
Pt given apple juice, per Vikki Ports, RN.

## 2016-10-07 NOTE — ED Notes (Signed)
Patient transported to CT 

## 2016-10-07 NOTE — H&P (Signed)
History and Physical    Emily Cohen YFV:[id number] DOB: 19-Oct-1950 DOA: 10/07/2016   PCP: Benito Mccreedy, MD   Patient coming from/Resides with: Private residence/roommate  Chief Complaint: Rectal bleeding and left upper quadrant abdominal pain  HPI: Emily Cohen is a 66 y.o. female with medical history significant for hypertension, alcohol abuse in remission, recurrent constipation, arthritis, pancreatic calcification, history of upper GI bleeding with nonbleeding gastric ulcer and pangastritis Feb 2018, dyslipidemia, breast cancer and former diabetes. Patient was admitted to the hospital in February with upper GI bleeding as described above. Since that time she has stopped utilizing alcoholic beverages and no longer utilizes NSAIDs as instructed. She reports taking her PPI as instructed. She was readmitted to the hospital in mid May for atypical chest discomfort felt to be noncardiac in etiology and left shoulder pain felt to be related to arthritis. Since that time she has been evaluated in the ER on multiple occasions with with complaints of back pain (x 3 visits), abdominal pain, medication refill, constipation. She returns today complaining of dizziness, postprandial abdominal pain. She reports previously prescribed pain medications from the ER are not working. She was not orthostatic. Her labs did appear to be consistent with mild dehydration. She also reported to the EDP painless rectal bleeding. Hemoglobin 11.9 microcytosis. EDP reports faintly positive stool on DRE.  ED Course:  Vital Signs: BP 134/66   Pulse 83   Temp 98.4 F (36.9 C) (Oral)   Resp 17   Ht '5\' 7"'  (1.702 m)   Wt 67.1 kg (148 lb)   SpO2 98%   BMI 23.18 kg/m  Lab data: Sodium 138, potassium 4.2, chloride 104, CO2 24, glucose 108, BUN 9, creatinine 0.65, alkaline phosphatase 140, anion gap 10, calcium 9.6, LFTs normal except for mildly elevated total bilirubin 1.5, white count 10,900 friends are not  obtained, hemoglobin 11.9, platelets 467,000, coags normal Medications and treatments: Pepcid 20 g IV 1  Review of Systems:  In addition to the HPI above,  No Fever-chills, myalgias or other constitutional symptoms No Headache, changes with Vision or hearing, new weakness, tingling, numbness in any extremity, dizziness, dysarthria or word finding difficulty, gait disturbance or imbalance, tremors or seizure activity No problems swallowing food or Liquids, indigestion/reflux, choking or coughing while eating No Chest pain, Cough or Shortness of Breath, palpitations, orthopnea or DOE No N/V, melena, dark tarry stools No dysuria, malodorous urine, hematuria or flank pain No new skin rashes, lesions, masses or bruises, No new joint pains, aches, swelling or redness No recent unintentional weight gain or loss No polyuria, polydypsia or polyphagia   Past Medical History:  Diagnosis Date  . Alcohol use   . Arthritis   . Blindness   . Breast cancer, left breast ([place])   . Cancer ([place])   . Diabetes mellitus   . Hypertension     Past Surgical History:  Procedure Laterality Date  . ESOPHAGOGASTRODUODENOSCOPY N/A 05/23/2016   Procedure: ESOPHAGOGASTRODUODENOSCOPY (EGD);  Surgeon: Milus Banister, MD;  Location: [place] [facility];  Service: Endoscopy;  Laterality: N/A;    Social History   Social History  . Marital status: Single    Spouse name: N/A  . Number of children: N/A  . Years of education: N/A   Occupational History  . Not on file.   Social History Main Topics  . Smoking status: Current Every Day Smoker  . Smokeless tobacco: Never Used  . Alcohol use Yes     Comment: heavy drinker  .  Drug use: No  . Sexual activity: Not on file   Other Topics Concern  . Not on file   Social History Narrative  . No narrative on file    Mobility: Independent Work history: Not obtained   No Known Allergies  Family history reviewed and not pertinent to current admission findings are  diagnosis-patient denies family history of colon cancer  Prior to Admission medications   Medication Sig Start Date End Date Taking? Authorizing Provider  acetaminophen (TYLENOL) 500 MG tablet Take 1 tablet (500 mg total) by mouth every 6 (six) hours as needed. Patient taking differently: Take 500 mg by mouth every 6 (six) hours as needed for mild pain, moderate pain, fever or headache.  08/25/16  Yes Arrien, Jimmy Picket, MD  amLODipine (NORVASC) 10 MG tablet Take 10 mg by mouth daily.   Yes [provider]  bisacodyl (DULCOLAX) 5 MG EC tablet Take 1 tablet (5 mg total) by mouth 2 (two) times daily. 10/01/16  Yes Carmin Muskrat, MD  CARAFATE 1 GM/10ML suspension Take 10 mLs by mouth 4 (four) times daily. 10/05/16  Yes [provider]  cyclobenzaprine (FLEXERIL) 5 MG tablet Take 5 mg by mouth 3 (three) times daily as needed for muscle spasms.   Yes [provider]  diclofenac sodium (VOLTAREN) 1 % GEL Apply 2 g topically 4 (four) times daily. Apply to right shoulder. Patient taking differently: Apply 2 g topically 4 (four) times daily as needed (for right shoulder pain).  08/25/16  Yes Arrien, Jimmy Picket, MD  HYDROcodone-acetaminophen (NORCO/VICODIN) 5-325 MG tablet Take 1 tablet by mouth every 6 (six) hours as needed for severe pain. 10/01/16  Yes Carmin Muskrat, MD  lovastatin (MEVACOR) 20 MG tablet Take 20 mg by mouth daily.   Yes [provider]  ondansetron (ZOFRAN) 4 MG tablet Take 4 mg by mouth 3 (three) times daily as needed for nausea or vomiting.   Yes [provider]  pantoprazole (PROTONIX) 40 MG tablet Take 40 mg by mouth daily.   Yes [provider]  promethazine (PHENERGAN) 25 MG tablet Take 25 mg by mouth every 6 (six) hours.   Yes [provider]  sodium phosphate (FLEET) 7-19 GM/118ML ENEM Place 133 mLs (1 enema total) rectally daily as needed for severe constipation. 10/01/16  Yes Carmin Muskrat, MD  traMADol  (ULTRAM) 50 MG tablet Take 50 mg by mouth 3 (three) times daily.   Yes [provider]  traZODone (DESYREL) 50 MG tablet Take 50 mg by mouth at bedtime. 10/05/16  Yes [provider]  venlafaxine XR (EFFEXOR-XR) 75 MG 24 hr capsule Take 75 mg by mouth daily with breakfast.   Yes [provider]    Physical Exam: Vitals:   10/07/16 1030 10/07/16 1045 10/07/16 1115 10/07/16 1145  BP: 123/66 137/74 131/64 134/66  Pulse: 81 (!) 141 (!) 48 83  Resp: 14 (!) '21 17 17  ' Temp:      TempSrc:      SpO2: 100% 100% 100% 98%  Weight:      Height:          Constitutional: NAD, calm, uncomfortable 2/2 LUQ abd pain Eyes: PERRL, lids and conjunctivae normal ENMT: Mucous membranes are moist. Posterior pharynx clear of any exudate or lesions.Normal dentition.  Neck: normal, supple, no masses, no thyromegaly Respiratory: clear to auscultation bilaterally, no wheezing, no crackles. Normal respiratory effort. No accessory muscle use.  Cardiovascular: Regular rate and rhythm, no murmurs / rubs / gallops.  No extremity edema. 2+ pedal pulses. No carotid bruits.  Abdomen: Focal left upper quadrant tenderness that radiates to back/left flank with palpation, no masses palpated. No hepatosplenomegaly. Bowel sounds positive. DRE completed by EDP Musculoskeletal: no clubbing / cyanosis. No joint deformity upper and lower extremities. Good ROM, no contractures. Normal muscle tone.  Skin: no rashes, lesions, ulcers. No induration Neurologic: CN 2-12 grossly intact. Sensation intact, DTR normal. Strength 5/5 x all 4 extremities.  Psychiatric: Normal judgment and insight. Alert and oriented x 3. Normal mood.    Labs on Admission: I have personally reviewed following labs and imaging studies  CBC:  Recent Labs Lab 10/01/16 2049 10/04/16 1456 10/07/16 1002  WBC 12.7* 15.9* 10.9*  NEUTROABS  --  11.9*  --   HGB 12.3 13.7 11.9*  HCT 36.1 41.6 35.2*  MCV 72.6* 74.8* 73.0*  PLT 453*  438* 030*   Basic Metabolic Panel:  Recent Labs Lab 10/01/16 2049 10/04/16 1456 10/07/16 1002  NA 141 140 138  K 3.0* 3.7 4.2  CL 106 111 104  CO2 24 18* 24  GLUCOSE 95 99 108*  BUN '11 6 9  ' CREATININE 0.55 0.64 0.65  CALCIUM 9.6 9.8 9.6   GFR: Estimated Creatinine Clearance: 68.2 mL/min (by C-G formula based on SCr of 0.65 mg/dL). Liver Function Tests:  Recent Labs Lab 10/01/16 2049 10/04/16 1456 10/07/16 1002  AST 14* 17 19  ALT 13* 13* 8*  ALKPHOS 120 139* 140*  BILITOT 0.4 0.6 1.5*  PROT 7.5 7.3 6.6  ALBUMIN 4.0 3.9 3.5    Recent Labs Lab 10/01/16 2049 10/04/16 1456  LIPASE 27 81*   No results for input(s): AMMONIA in the last 168 hours. Coagulation Profile:  Recent Labs Lab 10/07/16 1002  INR 1.04   Cardiac Enzymes: No results for input(s): CKTOTAL, CKMB, CKMBINDEX, TROPONINI in the last 168 hours. BNP (last 3 results) No results for input(s): PROBNP in the last 8760 hours. HbA1C: No results for input(s): HGBA1C in the last 72 hours. CBG: No results for input(s): GLUCAP in the last 168 hours. Lipid Profile: No results for input(s): CHOL, HDL, LDLCALC, TRIG, CHOLHDL, LDLDIRECT in the last 72 hours. Thyroid Function Tests: No results for input(s): TSH, T4TOTAL, FREET4, T3FREE, THYROIDAB in the last 72 hours. Anemia Panel: No results for input(s): VITAMINB12, FOLATE, FERRITIN, TIBC, IRON, RETICCTPCT in the last 72 hours. Urine analysis:    Component Value Date/Time   COLORURINE STRAW (A) 10/01/2016 2141   APPEARANCEUR CLEAR 10/01/2016 2141   LABSPEC 1.011 10/01/2016 2141   PHURINE 5.0 10/01/2016 2141   GLUCOSEU NEGATIVE 10/01/2016 2141   HGBUR NEGATIVE 10/01/2016 2141   BILIRUBINUR NEGATIVE 10/01/2016 2141   KETONESUR NEGATIVE 10/01/2016 2141   PROTEINUR NEGATIVE 10/01/2016 2141   UROBILINOGEN 0.2 02/20/2013 1609   NITRITE NEGATIVE 10/01/2016 2141   LEUKOCYTESUR NEGATIVE 10/01/2016 2141   Sepsis  Labs: '@LABRCNTIP' (procalcitonin:4,lacticidven:4) )No results found for this or any previous visit (from the past 240 hour(s)).   Radiological Exams on Admission: No results found.  EKG: (Independently reviewed) sinus rhythm with ventricular rate 90 bpm, QTC 460 ms, LVH, no acute ischemic changes  Assessment/Plan Principal Problem:   Rectal bleeding -Patient reported episodic rectal bleeding after bowel movement in setting of known chronic constipation; also ongoing left upper quadrant abdominal pain especially after eating for at least 1 month -Suspect sequelae of constipation (ie ?rectal ulcer) versus hemorrhoid contributing -Hemoglobin has decreased slightly from baseline (see below) -GI consulted -History of upper GI bleeding in  February so continue IV Protonix as well as Carafate; no hematemesis or coffee-ground emesis at home so we'll allow clear liquids for now  Active Problems:   Alcohol use -She states quit after February admission -EtOH level and UDS    Symptomatic anemia -Patient reports dizziness at home with standing although was not orthostatic here -Hemoglobin 3 days ago was 13.7 with current hemoglobin 11.9 and baseline hemoglobin 12.3 -Follow labs -Type/screen -No indication for transfusion at this juncture    ?? Lytic lesions lumbar spine -Incidental finding on CT abdomen and pelvis on 6/21 -History of breast cancer -Calcium 9.6 and total protein 6.6 so multiple myeloma less likely -Recommend PCP obtain bone scan to better clarify    Constipation -Chronic ongoing problem -Abdominal film to determine if constipation contributing to symptomatology -Previously prescribed o'clock 5 mg twice a day by EDP-unclear if adhering to regimen -Appears to be somewhat dehydrated given mildly elevated platelets as well as mildly elevated alkaline phosphatase and total bilirubin -IV fluids    Hypertension -Current blood pressure somewhat suboptimal -Hold antihypertensive  medications in setting of presumed GI bleeding and possible symptomatic anemia    Arthritis -With upper GI bleed history no longer candidate for NSAIDs -Previously prescribed Voltaren gel, Flexeril, Vicodin and Ultram for pain    Pancreatic calcification -Prior CTs with pancreatic calcifications consistent with chronic pancreatitis -Patient reports 1 month history of postprandial abdominal pain which could be related to chronic pancreatitis or sequelae of severe constipation -Lipase was elevated at 81 during ER visit 6/24-repeat lipase today -Clear liquids initially -Very low-dose IV morphine for pain given concerns of constipation contributing to symptoms -?? Begin Creon    Diabetes mellitus -Not on medications prior to admission -Normal hemoglobin A1c of 5.3 in February    HLD (hyperlipidemia) -Hold preadmission statin while on clear liquids and with mild transaminitis      DVT prophylaxis: SCDs Code Status: Full Family Communication: None  Disposition Plan: Home Consults called: Gastroenterology/[place]     Samella Parr ANP-BC [facility] Pager [phone]   If 7PM-7AM, please contact night-coverage [email or web address] Password [facility]  10/07/2016, 12:52 PM

## 2016-10-07 NOTE — ED Triage Notes (Signed)
PT arrives via EMS with complaints of abdominal pain and dizziness. PT has been seen multiple times for abdominal pain but states the pain medication is no longer working. She is scheduled for EGD in 2 months. PT reports this morning she began having dizziness worse with standing up around 0800. Denies N/V/D. Last [facility] 6/27

## 2016-10-07 NOTE — Consult Note (Addendum)
[facility] Consult: 2:09 PM 10/07/2016  LOS: 0 days    Referring Provider: Dr Marily Memos  Primary Care Physician:  Benito Mccreedy, MD Primary Gastroenterologist:  unassigned     Reason for Consultation:  Rectal bleeding   HPI: Emily Cohen is a 66 y.o. female.  PMH alcoholism.  Pancreatitis in 03/2013, probably alcoholic. Left breast cancer and mastectomy before 2002.  HTN.  Microcytosis dating to 08/2016. 05/23/16 EGD for hematemesis in setting of alcoholism and excessive NSAID use.  Medium to large, non-bleeding GU with VV.  Treated with epi, and gold probe cautery.  Moderate pan-gastritis, biopsied (chronic inactive gastritis, no H Pylori).  Transfused (Hgb nadir 8.5) 2 PRBCs and Hgb 10.3 at discharge.   Recent visits to ED with musculoskeletal pain (spine and shoulder), LLQ abdominal pain, constipation. After starting Flexeril and Tramadol from pain clinic, developed constipation and no BM for 4 weeks!  Then had episode of bloody diarrhea at home.  Came to ED and had:     10/01/16 CT abd/pelvis:  1. No acute abnormality seen to explain the patient's symptoms. 2. Focal lucencies noted at multiple levels along the lumbar spine, the largest of which is seen at L2.  This is concerning for metastatic disease or multiple myeloma. Bone scan would be helpful for further evaluation. 3. Scattered aortic atherosclerosis. 4. Calcification at the pancreatic head is somewhat more prominent than in 2014, reflecting chronic  sequelae of pancreatitis. 5. Scattered bilateral renal cysts noted. 6. Mild chronic compression deformity of vertebral body T12. 7. Stable calcification at the fundus of the gallbladder.  Gallbladder otherwise unremarkable. 8. 6 mm nonspecific hypodensity at the right hepatic lobe is grossly   stable and likely benign.  Plan was for her to follow up with PCP who could arrange referrals for the spinal findings.  She tells me the provider at PCP office (not Dr Orma Render) spoke of arranging GI follow up but not of any bone scan or ortho referral.  Poor appetite and early satiety for a few weeks.  No n/v or dysphagia.  Compliant with daily PPI and no NSAIDs, no ASA and no booze since 05/2016   148 # in 05/2016, same as now.   Now back in ED with abdominal pain and dizziness.  Has had no further bloody stools, just watery brown effluent after using laxative and fleets enemas as advised by ED staff last week.  LLQ pain is not elicited by abdominal palpation.  It is worse after eating but often occurs spontaneously.  Spine pain triggered by activity. Hgb 11.9, was 13.7 on 10/04/16.  MCV low at 73, Platelets and Coags normal.   BUN not elevated (was elevated to 49 in 05/2016).  Elevated alk phos 140 and t bili 1.5 but otherwise normal LFTs.   Per ED MD: blood on rectal exam without visible or palpable source.     Past Medical History:  Diagnosis Date  . Alcohol abuse 2014  . Arthritis   . Blindness   . Breast cancer, left breast ([place]) before 2002  .  Diabetes mellitus   . Gastric ulcer 05/2016   caused hematemesis.  GU with VV cauterized at EGD.  using NSAIDs.  biopsy negative for H Pylori.    Marland Kitchen Hypertension   . Left humeral fracture 01/2016   did not follow up with ortho as planned  . Pancreatitis 2014   likely due to ETOH    Past Surgical History:  Procedure Laterality Date  . ABDOMINAL HYSTERECTOMY    . ESOPHAGOGASTRODUODENOSCOPY N/A 05/23/2016   Procedure: ESOPHAGOGASTRODUODENOSCOPY (EGD);  Surgeon: Milus Banister, MD;  Location: [facility];  Service: Endoscopy;  Laterality: N/A;  . ESOPHAGOGASTRODUODENOSCOPY  05/2016   Dr Owens Loffler.  for hematemesis.  cauterized a large GU, gastritis noted.  H pylori negativ on biopsy  . MASTECTOMY Left before 2002   for cancer    Prior to  Admission medications   Medication Sig Start Date End Date Taking? Authorizing Provider  acetaminophen (TYLENOL) 500 MG tablet Take 1 tablet (500 mg total) by mouth every 6 (six) hours as needed. Patient taking differently: Take 500 mg by mouth every 6 (six) hours as needed for mild pain, moderate pain, fever or headache.  08/25/16  Yes Arrien, Jimmy Picket, MD  amLODipine (NORVASC) 10 MG tablet Take 10 mg by mouth daily.   Yes [provider]  bisacodyl (DULCOLAX) 5 MG EC tablet Take 1 tablet (5 mg total) by mouth 2 (two) times daily. 10/01/16  Yes Carmin Muskrat, MD  CARAFATE 1 GM/10ML suspension Take 10 mLs by mouth 4 (four) times daily. 10/05/16  Yes [provider]  cyclobenzaprine (FLEXERIL) 5 MG tablet Take 5 mg by mouth 3 (three) times daily as needed for muscle spasms.   Yes [provider]  diclofenac sodium (VOLTAREN) 1 % GEL Apply 2 g topically 4 (four) times daily. Apply to right shoulder. Patient taking differently: Apply 2 g topically 4 (four) times daily as needed (for right shoulder pain).  08/25/16  Yes Arrien, Jimmy Picket, MD  HYDROcodone-acetaminophen (NORCO/VICODIN) 5-325 MG tablet Take 1 tablet by mouth every 6 (six) hours as needed for severe pain. 10/01/16  Yes Carmin Muskrat, MD  lovastatin (MEVACOR) 20 MG tablet Take 20 mg by mouth daily.   Yes [provider]  ondansetron (ZOFRAN) 4 MG tablet Take 4 mg by mouth 3 (three) times daily as needed for nausea or vomiting.   Yes [provider]  pantoprazole (PROTONIX) 40 MG tablet Take 40 mg by mouth daily.   Yes [provider]  promethazine (PHENERGAN) 25 MG tablet Take 25 mg by mouth every 6 (six) hours.   Yes [provider]  sodium phosphate (FLEET) 7-19 GM/118ML ENEM Place 133 mLs (1 enema total) rectally daily as needed for severe constipation. 10/01/16  Yes Carmin Muskrat, MD  traMADol (ULTRAM) 50 MG tablet Take 50 mg by mouth 3 (three) times daily.    Yes [provider]  traZODone (DESYREL) 50 MG tablet Take 50 mg by mouth at bedtime. 10/05/16  Yes [provider]  venlafaxine XR (EFFEXOR-XR) 75 MG 24 hr capsule Take 75 mg by mouth daily with breakfast.   Yes [provider]    Scheduled Meds: . pantoprazole (PROTONIX) IV  40 mg Intravenous Q12H   Infusions: . sodium chloride 100 mL/hr at 10/07/16 1351   PRN Meds: morphine injection, ondansetron (ZOFRAN) IV **OR** promethazine   Allergies as of 10/07/2016  . (No Known Allergies)    No family history on file.  Social History  Social History  . Marital status: Single    Spouse name: N/A  . Number of children: N/A  . Years of education: N/A   Occupational History  . Not on file.   Social History Main Topics  . Smoking status: Current Every Day Smoker  . Smokeless tobacco: Never Used  . Alcohol use Yes     Comment: heavy drinker  . Drug use: No  . Sexual activity: Not on file   Other Topics Concern  . Not on file   Social History Narrative  . No narrative on file    REVIEW OF SYSTEMS: Constitutional:  Weakness.    ENT:  No nose bleeds Pulm:  Chronic cough, mostly non-productive.  If pain is bad, hard to take a deep breath CV:  No palpitations, no LE edema.  GU:  No hematuria, no frequency GI:  Per HPI.   Heme:  No unusual bleeding or bruising except bloody stool last week.    Transfusions:  2 PRBC in 05/2016.   Neuro:  Poor balance, uses walker or cane when upright.  "blindness" documented but pt says this is not true, she has had no vision issues.   Derm:  Previous pruritic eruptions on her upper back, have resolved Endocrine:  No sweats or chills.  No polyuria or dysuria Immunization:  Not queried.   Travel:  None beyond local counties in last few months.    PHYSICAL EXAM: Vital signs in last 24 hours: Vitals:   10/07/16 1300 10/07/16 1345  BP: 126/65 (!) 138/97  Pulse: 84 91  Resp: 11 17  Temp:     Wt Readings from  Last 3 Encounters:  10/07/16 67.1 kg (148 lb)  10/04/16 67.1 kg (148 lb)  10/01/16 67.1 kg (148 lb)    General: pleasant, somewhat ill looking and uncomfortable.  Fully awake Head:  No asymmetry or trauma signs  Eyes:  No icterus or conj pallor Ears:  Not HOH  Nose:  No congesti Mouth:  Moist and clear oral MM.  Tongue midline Neck:  No mass, no JVD Lungs:  Rough, ronchorous BS, husky smokers voice.  No dyspnea, slight cough Heart: RRR.  No MRG.  S1, S2 present Abdomen:  Soft, ND, BS scant but normal in quality.  No HSM, masses, hernias, bruits  Rectal: deferrred.  Blood without masses per ED MD   Musc/Skeltl: no joint swelling, redness or deformity.   Extremities:  No CCE.    Neurologic:  Oriented x3.  Good historian.  Moves all 4 limbs, strength not tested but no visible weakness or deficits.  No tremor Skin:  Healed scattered round, hyperpigmented scars on the mid to upper back.   Nodes:  No cervical adenopathy   Psych:  Pleasant, cooperative, calm.   Intake/Output from previous day: No intake/output data recorded. Intake/Output this shift: Total I/O In: 50 [IV Piggyback:50] Out: -   LAB RESULTS:  Recent Labs  10/04/16 1456 10/07/16 1002  WBC 15.9* 10.9*  HGB 13.7 11.9*  HCT 41.6 35.2*  PLT 438* 467*   BMET Lab Results  Component Value Date   NA 138 10/07/2016   NA 140 10/04/2016   NA 141 10/01/2016   K 4.2 10/07/2016   K 3.7 10/04/2016   K 3.0 (L) 10/01/2016   CL 104 10/07/2016   CL 111 10/04/2016   CL 106 10/01/2016   CO2 24 10/07/2016   CO2 18 (L) 10/04/2016   CO2 24 10/01/2016   GLUCOSE 108 (H) 10/07/2016  GLUCOSE 99 10/04/2016   GLUCOSE 95 10/01/2016   BUN 9 10/07/2016   BUN 6 10/04/2016   BUN 11 10/01/2016   CREATININE 0.65 10/07/2016   CREATININE 0.64 10/04/2016   CREATININE 0.55 10/01/2016   CALCIUM 9.6 10/07/2016   CALCIUM 9.8 10/04/2016   CALCIUM 9.6 10/01/2016   LFT  Recent Labs  10/04/16 1456 10/07/16 1002  PROT 7.3 6.6    ALBUMIN 3.9 3.5  AST 17 19  ALT 13* 8*  ALKPHOS 139* 140*  BILITOT 0.6 1.5*   PT/INR Lab Results  Component Value Date   INR 1.04 10/07/2016   INR 0.96 08/24/2016   INR 1.45 05/22/2016   Hepatitis Panel No results for input(s): HEPBSAG, HCVAB, HEPAIGM, HEPBIGM in the last 72 hours. C-Diff No components found for: CDIFF Lipase     Component Value Date/Time   LIPASE 32 10/07/2016 1230    Drugs of Abuse     Component Value Date/Time   LABOPIA NONE DETECTED 02/14/2011 1748   COCAINSCRNUR NONE DETECTED 02/14/2011 1748   LABBENZ NONE DETECTED 02/14/2011 1748   AMPHETMU NONE DETECTED 02/14/2011 1748   THCU NONE DETECTED 02/14/2011 1748   LABBARB NONE DETECTED 02/14/2011 1748     RADIOLOGY STUDIES: No results found.    IMPRESSION:   *  LLQ and multilevel spine pain.  CT 1 week ago with pancreatic calcifications, t 12 compression deformity, stabele/benign liver density, concern for metastatic spine lesions but no intestinal issues.  Remote left mastectomy before 2002 for cancer.   *  Single incidence of bloody diarrhea last week after 4 weeks without BM.  No colonic pathology on CT last week.  *  Gastric ulcer with VV causing hematemesis, cauterized in 05/2016.  Dr Ardis Hughs has not been able to reach pt to arrange f/up egd to assure ulcer is healed.  Pt has been compliant with daily Protonix, no NSAIDs and no ETOH.    *  Microcytic anemia.  Hgb dropped nearly 2 grams in last 4 days.       PLAN:     *  Upper endo and colonoscopy tomorrow.  Pt agreeable.   *  Needs bone scan (is this possible as inpt?). Will defer this non-GI issue to primary service  Azucena Freed  10/07/2016, 2:09 PM Pager: [phone]  GI ATTENDING  History, laboratories, x-rays, prior endoscopy reports reviewed. Patient personally seen and examined. Multiple issues. We are asked to see principally for recurrent abdominal pain and transient lower GI bleeding. She does have a history of gastric ulcer  secondary to NSAIDs. She denies ongoing NSAID use. She does NOT taking PPI. 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 Earlier this year with upper GI bleed secondary to large gastric ulcer as mentioned. She did not follow-up with GI as requested. She was contacted by letter when unavailable by phone. CT scan raises question of possible malignant disease in the spine. This may be source of pain. Chronic pancreatitis may be a source of pain. Recurrent ulcer disease may be a source of pain, and her rectal bleeding is due to neoplasia, this may be source of pain. Given all this, plan colonoscopy and upper endoscopy tomorrow to evaluate pain, rectal bleeding, and assess status of gastric ulcer.The nature of the procedure, as well as the risks, benefits, and alternatives were carefully and thoroughly reviewed with the patient. Ample time for discussion and questions allowed. The patient understood, was satisfied, and agreed to proceed. Primary service to address  spinal abnormalities found on imaging. Thank you  Docia Chuck. Geri Seminole., M.D. [facility] Division of Gastroenterology

## 2016-10-07 NOTE — ED Provider Notes (Signed)
[facility] DEPT Provider Note   CSN: [id number] Arrival date & time: 10/07/16  0905     History   Chief Complaint Chief Complaint  Patient presents with  . Abdominal Pain  . Dizziness    HPI Emily Cohen is a 66 y.o. female.  HPI   66 y.o. female with medical history significant for hypertension, alcohol abuse in remission, recurrent constipation, arthritis, pancreatic calcification, history of upper GI bleeding with nonbleeding gastric ulcer per 2018 EGD comes in with cc of bleeding. Pt reports that she has been having bloody stools the last 3 days. Pt has BRBPR. She denies melena. Pt has stopped her nsaids and she is not on blood thinners. Pt also has dizziness - intermittent, and usually when she is standing or with exertion. Pt denies new dib, chest pain, fainting.  Past Medical History:  Diagnosis Date  . Alcohol abuse 2014  . Arthritis   . Blindness   . Breast cancer, left breast ([place])   . Diabetes mellitus   . Gastric ulcer 05/2016   caused hematemesis.  GU with VV cauterized at EGD.  using NSAIDs.  biopsy negative for H Pylori.    Marland Kitchen Hypertension   . Left humeral fracture 01/2016   did not follow up with ortho as planned  . Pancreatitis 2014   likely due to ETOH    Patient Active Problem List   Diagnosis Date Noted  . Rectal bleeding 10/07/2016  . Hypertension 10/07/2016  . Diabetes mellitus 10/07/2016  . Alcohol use 10/07/2016  . Arthritis 10/07/2016  . HLD (hyperlipidemia) 10/07/2016  . Symptomatic anemia 10/07/2016  . Pancreatic calcification 10/07/2016  . Constipation 10/07/2016  . Chest pain 08/24/2016  . Respiratory failure ([place])   . Acute post-hemorrhagic anemia   . Alcohol abuse   . Acute upper GI bleed 05/22/2016    Past Surgical History:  Procedure Laterality Date  . ESOPHAGOGASTRODUODENOSCOPY N/A 05/23/2016   Procedure: ESOPHAGOGASTRODUODENOSCOPY (EGD);  Surgeon: Milus Banister, MD;  Location: [facility];  Service: Endoscopy;   Laterality: N/A;    OB History    No data available       Home Medications    Prior to Admission medications   Medication Sig Start Date End Date Taking? Authorizing Provider  acetaminophen (TYLENOL) 500 MG tablet Take 1 tablet (500 mg total) by mouth every 6 (six) hours as needed. Patient taking differently: Take 500 mg by mouth every 6 (six) hours as needed for mild pain, moderate pain, fever or headache.  08/25/16  Yes Arrien, Jimmy Picket, MD  amLODipine (NORVASC) 10 MG tablet Take 10 mg by mouth daily.   Yes [provider]  bisacodyl (DULCOLAX) 5 MG EC tablet Take 1 tablet (5 mg total) by mouth 2 (two) times daily. 10/01/16  Yes Carmin Muskrat, MD  CARAFATE 1 GM/10ML suspension Take 10 mLs by mouth 4 (four) times daily. 10/05/16  Yes [provider]  cyclobenzaprine (FLEXERIL) 5 MG tablet Take 5 mg by mouth 3 (three) times daily as needed for muscle spasms.   Yes [provider]  diclofenac sodium (VOLTAREN) 1 % GEL Apply 2 g topically 4 (four) times daily. Apply to right shoulder. Patient taking differently: Apply 2 g topically 4 (four) times daily as needed (for right shoulder pain).  08/25/16  Yes Arrien, Jimmy Picket, MD  HYDROcodone-acetaminophen (NORCO/VICODIN) 5-325 MG tablet Take 1 tablet by mouth every 6 (six) hours as needed for severe pain. 10/01/16  Yes Carmin Muskrat, MD  lovastatin (  MEVACOR) 20 MG tablet Take 20 mg by mouth daily.   Yes [provider]  ondansetron (ZOFRAN) 4 MG tablet Take 4 mg by mouth 3 (three) times daily as needed for nausea or vomiting.   Yes [provider]  pantoprazole (PROTONIX) 40 MG tablet Take 40 mg by mouth daily.   Yes [provider]  promethazine (PHENERGAN) 25 MG tablet Take 25 mg by mouth every 6 (six) hours.   Yes [provider]  sodium phosphate (FLEET) 7-19 GM/118ML ENEM Place 133 mLs (1 enema total) rectally daily as needed for severe constipation. 10/01/16  Yes  Carmin Muskrat, MD  traMADol (ULTRAM) 50 MG tablet Take 50 mg by mouth 3 (three) times daily.   Yes [provider]  traZODone (DESYREL) 50 MG tablet Take 50 mg by mouth at bedtime. 10/05/16  Yes [provider]  venlafaxine XR (EFFEXOR-XR) 75 MG 24 hr capsule Take 75 mg by mouth daily with breakfast.   Yes [provider]    Family History No family history on file.  Social History Social History  Substance Use Topics  . Smoking status: Current Every Day Smoker  . Smokeless tobacco: Never Used  . Alcohol use Yes     Comment: heavy drinker     Allergies   Patient has no known allergies.   Review of Systems Review of Systems  All other systems reviewed and are negative.    Physical Exam Updated Vital Signs BP 134/66   Pulse 83   Temp 98.4 F (36.9 C) (Oral)   Resp 17   Ht 5\' 7"  (1.702 m)   Wt 67.1 kg (148 lb)   SpO2 98%   BMI 23.18 kg/m   Physical Exam  Constitutional: She is oriented to person, place, and time. She appears well-developed and well-nourished.  HENT:  Head: Normocephalic and atraumatic.  Eyes: EOM are normal. Pupils are equal, round, and reactive to light.  Neck: Neck supple.  Cardiovascular: Normal rate and regular rhythm.   Murmur heard. Pulmonary/Chest: Effort normal. No respiratory distress.  Abdominal: Soft. She exhibits no distension. There is no tenderness. There is no rebound and no guarding.  faintly guaiac + stools, no melena  Neurological: She is alert and oriented to person, place, and time.  Skin: Skin is warm and dry.  Nursing note and vitals reviewed.    ED Treatments / Results  Labs (all labs ordered are listed, but only abnormal results are displayed) Labs Reviewed  CBC - Abnormal; Notable for the following:       Result Value   WBC 10.9 (*)    Hemoglobin 11.9 (*)    HCT 35.2 (*)    MCV 73.0 (*)    MCH 24.7 (*)    RDW 19.5 (*)    Platelets 467 (*)    All other components within normal  limits  COMPREHENSIVE METABOLIC PANEL - Abnormal; Notable for the following:    Glucose, Bld 108 (*)    ALT 8 (*)    Alkaline Phosphatase 140 (*)    Total Bilirubin 1.5 (*)    All other components within normal limits  PROTIME-INR  ETHANOL  RAPID URINE DRUG SCREEN, HOSP PERFORMED  LIPASE, BLOOD  TYPE AND SCREEN    EKG  EKG Interpretation  Date/Time:  Wednesday October 07 2016 09:11:09 EDT Ventricular Rate:  90 PR Interval:    QRS Duration: 109 QT Interval:  383 QTC Calculation: 469 R Axis:   -51 Text Interpretation:  Sinus rhythm LAD, consider left anterior fascicular block RSR' in V1 or V2, right VCD or RVH Left ventricular hypertrophy ST elevation, consider inferior injury No acute changes No significant change since last tracing Confirmed by Varney Biles [phone]) on 10/07/2016 10:18:54 AM       Radiology No results found.  Procedures Procedures (including critical care time)  Medications Ordered in ED Medications  pantoprazole (PROTONIX) injection 40 mg (not administered)  0.9 %  sodium chloride infusion (not administered)  morphine 4 MG/ML injection 1 mg (not administered)  ondansetron (ZOFRAN) injection 4 mg (not administered)    Or  promethazine (PHENERGAN) injection 12.5 mg (not administered)  famotidine (PEPCID) IVPB 20 mg premix (0 mg Intravenous Stopped 10/07/16 1040)     Initial Impression / Assessment and Plan / ED Course  I have reviewed the triage vital signs and the nursing notes.  Pertinent labs & imaging results that were available during my care of the patient were reviewed by me and considered in my medical decision making (see chart for details).     Pt comes in with cc of bloody stools. She also has dizziness. Pt has multiple medical problems as outlined in the HPI, and she has known gastric ulcers and alcohol abuse hx. Pt's Hb has dropped by more than a gram. The BUN is reassuring- so I doubt that she has an UGIB. We will give iv pepcid. Pt  reports actively having bloody stools when she has to go and she has called GI with no appt until 2 months from now. We will consult medicine for obs admission for active GI bleed, likely lower.   Final Clinical Impressions(s) / ED Diagnoses   Final diagnoses:  BRBPR (bright red blood per rectum)  Dizziness    New Prescriptions New Prescriptions   No medications on file     Varney Biles, MD 10/07/16 1328

## 2016-10-07 NOTE — ED Notes (Signed)
Pt educated on bowel prep for procedure.

## 2016-10-08 ENCOUNTER — Encounter (HOSPITAL_COMMUNITY)
Admission: EM | Disposition: A | Discharge status: Home / Self Care | Payer: Self-pay | Source: Home / Self Care | Attending: Internal Medicine

## 2016-10-08 ENCOUNTER — Inpatient Hospital Stay (HOSPITAL_COMMUNITY): Payer: Medicare Other | Admitting: Anesthesiology

## 2016-10-08 ENCOUNTER — Encounter (HOSPITAL_COMMUNITY): Payer: Self-pay | Admitting: *Deleted

## 2016-10-08 DIAGNOSIS — E860 Dehydration: Secondary | ICD-10-CM | POA: Diagnosis not present

## 2016-10-08 DIAGNOSIS — Q393 Congenital stenosis and stricture of esophagus: Secondary | ICD-10-CM | POA: Diagnosis not present

## 2016-10-08 DIAGNOSIS — Z853 Personal history of malignant neoplasm of breast: Secondary | ICD-10-CM | POA: Diagnosis not present

## 2016-10-08 DIAGNOSIS — R42 Dizziness and giddiness: Secondary | ICD-10-CM | POA: Diagnosis present

## 2016-10-08 DIAGNOSIS — D122 Benign neoplasm of ascending colon: Secondary | ICD-10-CM

## 2016-10-08 DIAGNOSIS — D123 Benign neoplasm of transverse colon: Secondary | ICD-10-CM | POA: Diagnosis not present

## 2016-10-08 DIAGNOSIS — R1084 Generalized abdominal pain: Secondary | ICD-10-CM | POA: Diagnosis not present

## 2016-10-08 DIAGNOSIS — K5730 Diverticulosis of large intestine without perforation or abscess without bleeding: Secondary | ICD-10-CM | POA: Diagnosis not present

## 2016-10-08 DIAGNOSIS — K222 Esophageal obstruction: Secondary | ICD-10-CM

## 2016-10-08 DIAGNOSIS — M545 Low back pain: Secondary | ICD-10-CM | POA: Diagnosis not present

## 2016-10-08 DIAGNOSIS — F17210 Nicotine dependence, cigarettes, uncomplicated: Secondary | ICD-10-CM | POA: Diagnosis not present

## 2016-10-08 DIAGNOSIS — I10 Essential (primary) hypertension: Secondary | ICD-10-CM | POA: Diagnosis not present

## 2016-10-08 DIAGNOSIS — H547 Unspecified visual loss: Secondary | ICD-10-CM | POA: Diagnosis not present

## 2016-10-08 DIAGNOSIS — E119 Type 2 diabetes mellitus without complications: Secondary | ICD-10-CM | POA: Diagnosis not present

## 2016-10-08 DIAGNOSIS — K625 Hemorrhage of anus and rectum: Secondary | ICD-10-CM | POA: Diagnosis not present

## 2016-10-08 DIAGNOSIS — E785 Hyperlipidemia, unspecified: Secondary | ICD-10-CM | POA: Diagnosis not present

## 2016-10-08 DIAGNOSIS — Z79899 Other long term (current) drug therapy: Secondary | ICD-10-CM | POA: Diagnosis not present

## 2016-10-08 DIAGNOSIS — E876 Hypokalemia: Secondary | ICD-10-CM | POA: Diagnosis not present

## 2016-10-08 DIAGNOSIS — M1990 Unspecified osteoarthritis, unspecified site: Secondary | ICD-10-CM | POA: Diagnosis not present

## 2016-10-08 DIAGNOSIS — K5909 Other constipation: Secondary | ICD-10-CM | POA: Diagnosis not present

## 2016-10-08 DIAGNOSIS — D509 Iron deficiency anemia, unspecified: Secondary | ICD-10-CM | POA: Diagnosis not present

## 2016-10-08 DIAGNOSIS — K861 Other chronic pancreatitis: Secondary | ICD-10-CM | POA: Diagnosis not present

## 2016-10-08 DIAGNOSIS — K648 Other hemorrhoids: Secondary | ICD-10-CM | POA: Diagnosis not present

## 2016-10-08 HISTORY — PX: ESOPHAGOGASTRODUODENOSCOPY: SHX5428

## 2016-10-08 HISTORY — PX: COLONOSCOPY: SHX5424

## 2016-10-08 LAB — BASIC METABOLIC PANEL
ANION GAP: 7 (ref 5–15)
BUN: 5 mg/dL — ABNORMAL LOW (ref 6–20)
CALCIUM: 9 mg/dL (ref 8.9–10.3)
CHLORIDE: 113 mmol/L — AB (ref 101–111)
CO2: 22 mmol/L (ref 22–32)
Creatinine, Ser: 0.57 mg/dL (ref 0.44–1.00)
GFR calc non Af Amer: >60 mL/min (ref >60)
Glucose, Bld: 92 mg/dL (ref 65–99)
Potassium: 3.4 mmol/L — ABNORMAL LOW (ref 3.5–5.1)
Sodium: 142 mmol/L (ref 135–145)

## 2016-10-08 LAB — COMPREHENSIVE METABOLIC PANEL
ALBUMIN: 3.3 g/dL — AB (ref 3.5–5.0)
ALT: 13 U/L — ABNORMAL LOW (ref 14–54)
ANION GAP: 10 (ref 5–15)
AST: 12 U/L — ABNORMAL LOW (ref 15–41)
Alkaline Phosphatase: 126 U/L (ref 38–126)
BUN: 6 mg/dL (ref 6–20)
CO2: 22 mmol/L (ref 22–32)
Calcium: 9.1 mg/dL (ref 8.9–10.3)
Chloride: 109 mmol/L (ref 101–111)
Creatinine, Ser: 0.55 mg/dL (ref 0.44–1.00)
GFR calc Af Amer: >60 mL/min (ref >60)
GFR calc non Af Amer: >60 mL/min (ref >60)
GLUCOSE: 96 mg/dL (ref 65–99)
POTASSIUM: 2.7 mmol/L — AB (ref 3.5–5.1)
SODIUM: 141 mmol/L (ref 135–145)
Total Bilirubin: 0.9 mg/dL (ref 0.3–1.2)
Total Protein: 6.4 g/dL — ABNORMAL LOW (ref 6.5–8.1)

## 2016-10-08 LAB — CBC
HEMATOCRIT: 34.6 % — AB (ref 36.0–46.0)
Hemoglobin: 11.5 g/dL — ABNORMAL LOW (ref 12.0–15.0)
MCH: 24.5 pg — ABNORMAL LOW (ref 26.0–34.0)
MCHC: 33.2 g/dL (ref 30.0–36.0)
MCV: 73.8 fL — ABNORMAL LOW (ref 78.0–100.0)
PLATELETS: 447 10*3/uL — AB (ref 150–400)
RBC: 4.69 MIL/uL (ref 3.87–5.11)
RDW: 19.4 % — AB (ref 11.5–15.5)
WBC: 10.9 10*3/uL — AB (ref 4.0–10.5)

## 2016-10-08 LAB — HIV ANTIBODY (ROUTINE TESTING W REFLEX): HIV SCREEN 4TH GENERATION: NONREACTIVE

## 2016-10-08 LAB — MAGNESIUM: Magnesium: 1.4 mg/dL — ABNORMAL LOW (ref 1.7–2.4)

## 2016-10-08 SURGERY — EGD (ESOPHAGOGASTRODUODENOSCOPY)
Anesthesia: Monitor Anesthesia Care

## 2016-10-08 MED ORDER — PROPOFOL 500 MG/50ML IV EMUL
INTRAVENOUS | Status: DC | PRN
  Administered 2016-10-08: 125 ug/kg/min via INTRAVENOUS

## 2016-10-08 MED ORDER — LACTATED RINGERS IV SOLN
INTRAVENOUS | Status: DC | PRN
  Administered 2016-10-08: 14:00:00 via INTRAVENOUS

## 2016-10-08 MED ORDER — PROPOFOL 10 MG/ML IV BOLUS
INTRAVENOUS | Status: DC | PRN
  Administered 2016-10-08 (×5): 20 mg via INTRAVENOUS

## 2016-10-08 MED ORDER — POTASSIUM CHLORIDE 10 MEQ/100ML IV SOLN
10.0000 meq | INTRAVENOUS | Status: AC
  Administered 2016-10-08 (×4): 10 meq via INTRAVENOUS
  Filled 2016-10-08 (×4): qty 100

## 2016-10-08 MED ORDER — KETOROLAC TROMETHAMINE 15 MG/ML IJ SOLN
15.0000 mg | Freq: Once | INTRAMUSCULAR | Status: AC
  Administered 2016-10-08: 15 mg via INTRAVENOUS
  Filled 2016-10-08: qty 1

## 2016-10-08 NOTE — H&P (View-Only) (Signed)
[facility] Consult: 2:09 PM 10/07/2016  LOS: 0 days    Referring Provider: Dr Marily Memos  Primary Care Physician:  Benito Mccreedy, MD Primary Gastroenterologist:  unassigned     Reason for Consultation:  Rectal bleeding   HPI: Emily Cohen is a 66 y.o. female.  PMH alcoholism.  Pancreatitis in 03/2013, probably alcoholic. Left breast cancer and mastectomy before 2002.  HTN.  Microcytosis dating to 08/2016. 05/23/16 EGD for hematemesis in setting of alcoholism and excessive NSAID use.  Medium to large, non-bleeding GU with VV.  Treated with epi, and gold probe cautery.  Moderate pan-gastritis, biopsied (chronic inactive gastritis, no H Pylori).  Transfused (Hgb nadir 8.5) 2 PRBCs and Hgb 10.3 at discharge.   Recent visits to ED with musculoskeletal pain (spine and shoulder), LLQ abdominal pain, constipation. After starting Flexeril and Tramadol from pain clinic, developed constipation and no BM for 4 weeks!  Then had episode of bloody diarrhea at home.  Came to ED and had:     10/01/16 CT abd/pelvis:  1. No acute abnormality seen to explain the patient's symptoms. 2. Focal lucencies noted at multiple levels along the lumbar spine, the largest of which is seen at L2.  This is concerning for metastatic disease or multiple myeloma. Bone scan would be helpful for further evaluation. 3. Scattered aortic atherosclerosis. 4. Calcification at the pancreatic head is somewhat more prominent than in 2014, reflecting chronic  sequelae of pancreatitis. 5. Scattered bilateral renal cysts noted. 6. Mild chronic compression deformity of vertebral body T12. 7. Stable calcification at the fundus of the gallbladder.  Gallbladder otherwise unremarkable. 8. 6 mm nonspecific hypodensity at the right hepatic lobe is grossly   stable and likely benign.  Plan was for her to follow up with PCP who could arrange referrals for the spinal findings.  She tells me the provider at PCP office (not Dr Orma Render) spoke of arranging GI follow up but not of any bone scan or ortho referral.  Poor appetite and early satiety for a few weeks.  No n/v or dysphagia.  Compliant with daily PPI and no NSAIDs, no ASA and no booze since 05/2016   148 # in 05/2016, same as now.   Now back in ED with abdominal pain and dizziness.  Has had no further bloody stools, just watery brown effluent after using laxative and fleets enemas as advised by ED staff last week.  LLQ pain is not elicited by abdominal palpation.  It is worse after eating but often occurs spontaneously.  Spine pain triggered by activity. Hgb 11.9, was 13.7 on 10/04/16.  MCV low at 73, Platelets and Coags normal.   BUN not elevated (was elevated to 49 in 05/2016).  Elevated alk phos 140 and t bili 1.5 but otherwise normal LFTs.   Per ED MD: blood on rectal exam without visible or palpable source.     Past Medical History:  Diagnosis Date  . Alcohol abuse 2014  . Arthritis   . Blindness   . Breast cancer, left breast ([place]) before 2002  .  Diabetes mellitus   . Gastric ulcer 05/2016   caused hematemesis.  GU with VV cauterized at EGD.  using NSAIDs.  biopsy negative for H Pylori.    Marland Kitchen Hypertension   . Left humeral fracture 01/2016   did not follow up with ortho as planned  . Pancreatitis 2014   likely due to ETOH    Past Surgical History:  Procedure Laterality Date  . ABDOMINAL HYSTERECTOMY    . ESOPHAGOGASTRODUODENOSCOPY N/A 05/23/2016   Procedure: ESOPHAGOGASTRODUODENOSCOPY (EGD);  Surgeon: Milus Banister, MD;  Location: [facility];  Service: Endoscopy;  Laterality: N/A;  . ESOPHAGOGASTRODUODENOSCOPY  05/2016   Dr Owens Loffler.  for hematemesis.  cauterized a large GU, gastritis noted.  H pylori negativ on biopsy  . MASTECTOMY Left before 2002   for cancer    Prior to  Admission medications   Medication Sig Start Date End Date Taking? Authorizing Provider  acetaminophen (TYLENOL) 500 MG tablet Take 1 tablet (500 mg total) by mouth every 6 (six) hours as needed. Patient taking differently: Take 500 mg by mouth every 6 (six) hours as needed for mild pain, moderate pain, fever or headache.  08/25/16  Yes Arrien, Jimmy Picket, MD  amLODipine (NORVASC) 10 MG tablet Take 10 mg by mouth daily.   Yes [provider]  bisacodyl (DULCOLAX) 5 MG EC tablet Take 1 tablet (5 mg total) by mouth 2 (two) times daily. 10/01/16  Yes Carmin Muskrat, MD  CARAFATE 1 GM/10ML suspension Take 10 mLs by mouth 4 (four) times daily. 10/05/16  Yes [provider]  cyclobenzaprine (FLEXERIL) 5 MG tablet Take 5 mg by mouth 3 (three) times daily as needed for muscle spasms.   Yes [provider]  diclofenac sodium (VOLTAREN) 1 % GEL Apply 2 g topically 4 (four) times daily. Apply to right shoulder. Patient taking differently: Apply 2 g topically 4 (four) times daily as needed (for right shoulder pain).  08/25/16  Yes Arrien, Jimmy Picket, MD  HYDROcodone-acetaminophen (NORCO/VICODIN) 5-325 MG tablet Take 1 tablet by mouth every 6 (six) hours as needed for severe pain. 10/01/16  Yes Carmin Muskrat, MD  lovastatin (MEVACOR) 20 MG tablet Take 20 mg by mouth daily.   Yes [provider]  ondansetron (ZOFRAN) 4 MG tablet Take 4 mg by mouth 3 (three) times daily as needed for nausea or vomiting.   Yes [provider]  pantoprazole (PROTONIX) 40 MG tablet Take 40 mg by mouth daily.   Yes [provider]  promethazine (PHENERGAN) 25 MG tablet Take 25 mg by mouth every 6 (six) hours.   Yes [provider]  sodium phosphate (FLEET) 7-19 GM/118ML ENEM Place 133 mLs (1 enema total) rectally daily as needed for severe constipation. 10/01/16  Yes Carmin Muskrat, MD  traMADol (ULTRAM) 50 MG tablet Take 50 mg by mouth 3 (three) times daily.    Yes [provider]  traZODone (DESYREL) 50 MG tablet Take 50 mg by mouth at bedtime. 10/05/16  Yes [provider]  venlafaxine XR (EFFEXOR-XR) 75 MG 24 hr capsule Take 75 mg by mouth daily with breakfast.   Yes [provider]    Scheduled Meds: . pantoprazole (PROTONIX) IV  40 mg Intravenous Q12H   Infusions: . sodium chloride 100 mL/hr at 10/07/16 1351   PRN Meds: morphine injection, ondansetron (ZOFRAN) IV **OR** promethazine   Allergies as of 10/07/2016  . (No Known Allergies)    No family history on file.  Social History  Social History  . Marital status: Single    Spouse name: N/A  . Number of children: N/A  . Years of education: N/A   Occupational History  . Not on file.   Social History Main Topics  . Smoking status: Current Every Day Smoker  . Smokeless tobacco: Never Used  . Alcohol use Yes     Comment: heavy drinker  . Drug use: No  . Sexual activity: Not on file   Other Topics Concern  . Not on file   Social History Narrative  . No narrative on file    REVIEW OF SYSTEMS: Constitutional:  Weakness.    ENT:  No nose bleeds Pulm:  Chronic cough, mostly non-productive.  If pain is bad, hard to take a deep breath CV:  No palpitations, no LE edema.  GU:  No hematuria, no frequency GI:  Per HPI.   Heme:  No unusual bleeding or bruising except bloody stool last week.    Transfusions:  2 PRBC in 05/2016.   Neuro:  Poor balance, uses walker or cane when upright.  "blindness" documented but pt says this is not true, she has had no vision issues.   Derm:  Previous pruritic eruptions on her upper back, have resolved Endocrine:  No sweats or chills.  No polyuria or dysuria Immunization:  Not queried.   Travel:  None beyond local counties in last few months.    PHYSICAL EXAM: Vital signs in last 24 hours: Vitals:   10/07/16 1300 10/07/16 1345  BP: 126/65 (!) 138/97  Pulse: 84 91  Resp: 11 17  Temp:     Wt Readings from  Last 3 Encounters:  10/07/16 67.1 kg (148 lb)  10/04/16 67.1 kg (148 lb)  10/01/16 67.1 kg (148 lb)    General: pleasant, somewhat ill looking and uncomfortable.  Fully awake Head:  No asymmetry or trauma signs  Eyes:  No icterus or conj pallor Ears:  Not HOH  Nose:  No congesti Mouth:  Moist and clear oral MM.  Tongue midline Neck:  No mass, no JVD Lungs:  Rough, ronchorous BS, husky smokers voice.  No dyspnea, slight cough Heart: RRR.  No MRG.  S1, S2 present Abdomen:  Soft, ND, BS scant but normal in quality.  No HSM, masses, hernias, bruits  Rectal: deferrred.  Blood without masses per ED MD   Musc/Skeltl: no joint swelling, redness or deformity.   Extremities:  No CCE.    Neurologic:  Oriented x3.  Good historian.  Moves all 4 limbs, strength not tested but no visible weakness or deficits.  No tremor Skin:  Healed scattered round, hyperpigmented scars on the mid to upper back.   Nodes:  No cervical adenopathy   Psych:  Pleasant, cooperative, calm.   Intake/Output from previous day: No intake/output data recorded. Intake/Output this shift: Total I/O In: 50 [IV Piggyback:50] Out: -   LAB RESULTS:  Recent Labs  10/04/16 1456 10/07/16 1002  WBC 15.9* 10.9*  HGB 13.7 11.9*  HCT 41.6 35.2*  PLT 438* 467*   BMET Lab Results  Component Value Date   NA 138 10/07/2016   NA 140 10/04/2016   NA 141 10/01/2016   K 4.2 10/07/2016   K 3.7 10/04/2016   K 3.0 (L) 10/01/2016   CL 104 10/07/2016   CL 111 10/04/2016   CL 106 10/01/2016   CO2 24 10/07/2016   CO2 18 (L) 10/04/2016   CO2 24 10/01/2016   GLUCOSE 108 (H) 10/07/2016  GLUCOSE 99 10/04/2016   GLUCOSE 95 10/01/2016   BUN 9 10/07/2016   BUN 6 10/04/2016   BUN 11 10/01/2016   CREATININE 0.65 10/07/2016   CREATININE 0.64 10/04/2016   CREATININE 0.55 10/01/2016   CALCIUM 9.6 10/07/2016   CALCIUM 9.8 10/04/2016   CALCIUM 9.6 10/01/2016   LFT  Recent Labs  10/04/16 1456 10/07/16 1002  PROT 7.3 6.6    ALBUMIN 3.9 3.5  AST 17 19  ALT 13* 8*  ALKPHOS 139* 140*  BILITOT 0.6 1.5*   PT/INR Lab Results  Component Value Date   INR 1.04 10/07/2016   INR 0.96 08/24/2016   INR 1.45 05/22/2016   Hepatitis Panel No results for input(s): HEPBSAG, HCVAB, HEPAIGM, HEPBIGM in the last 72 hours. C-Diff No components found for: CDIFF Lipase     Component Value Date/Time   LIPASE 32 10/07/2016 1230    Drugs of Abuse     Component Value Date/Time   LABOPIA NONE DETECTED 02/14/2011 1748   COCAINSCRNUR NONE DETECTED 02/14/2011 1748   LABBENZ NONE DETECTED 02/14/2011 1748   AMPHETMU NONE DETECTED 02/14/2011 1748   THCU NONE DETECTED 02/14/2011 1748   LABBARB NONE DETECTED 02/14/2011 1748     RADIOLOGY STUDIES: No results found.    IMPRESSION:   *  LLQ and multilevel spine pain.  CT 1 week ago with pancreatic calcifications, t 12 compression deformity, stabele/benign liver density, concern for metastatic spine lesions but no intestinal issues.  Remote left mastectomy before 2002 for cancer.   *  Single incidence of bloody diarrhea last week after 4 weeks without BM.  No colonic pathology on CT last week.  *  Gastric ulcer with VV causing hematemesis, cauterized in 05/2016.  Dr Ardis Hughs has not been able to reach pt to arrange f/up egd to assure ulcer is healed.  Pt has been compliant with daily Protonix, no NSAIDs and no ETOH.    *  Microcytic anemia.  Hgb dropped nearly 2 grams in last 4 days.       PLAN:     *  Upper endo and colonoscopy tomorrow.  Pt agreeable.   *  Needs bone scan (is this possible as inpt?). Will defer this non-GI issue to primary service  Azucena Freed  10/07/2016, 2:09 PM Pager: [phone]  GI ATTENDING  History, laboratories, x-rays, prior endoscopy reports reviewed. Patient personally seen and examined. Multiple issues. We are asked to see principally for recurrent abdominal pain and transient lower GI bleeding. She does have a history of gastric ulcer  secondary to NSAIDs. She denies ongoing NSAID use. She does NOT taking PPI. 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 Earlier this year with upper GI bleed secondary to large gastric ulcer as mentioned. She did not follow-up with GI as requested. She was contacted by letter when unavailable by phone. CT scan raises question of possible malignant disease in the spine. This may be source of pain. Chronic pancreatitis may be a source of pain. Recurrent ulcer disease may be a source of pain, and her rectal bleeding is due to neoplasia, this may be source of pain. Given all this, plan colonoscopy and upper endoscopy tomorrow to evaluate pain, rectal bleeding, and assess status of gastric ulcer.The nature of the procedure, as well as the risks, benefits, and alternatives were carefully and thoroughly reviewed with the patient. Ample time for discussion and questions allowed. The patient understood, was satisfied, and agreed to proceed. Primary service to address  spinal abnormalities found on imaging. Thank you  Docia Chuck. Geri Seminole., M.D. [facility] Division of Gastroenterology

## 2016-10-08 NOTE — Transfer of Care (Signed)
Immediate Anesthesia Transfer of Care Note  Patient: Emily Cohen  Procedure(s) Performed: Procedure(s): ESOPHAGOGASTRODUODENOSCOPY (EGD) (N/A) COLONOSCOPY (N/A)  Patient Location: Endoscopy Unit  Anesthesia Type:MAC  Level of Consciousness: awake, alert  and oriented  Airway & Oxygen Therapy: Patient Spontanous Breathing  Post-op Assessment: Report given to RN and Post -op Vital signs reviewed and stable  Post vital signs: Reviewed and stable  Last Vitals:  Vitals:   10/08/16 0918 10/08/16 1257  BP: (!) 154/82 (!) 154/65  Pulse: 86 81  Resp: 16 13  Temp: 36.7 C 36.8 C    Last Pain:  Vitals:   10/08/16 1257  TempSrc: Oral  PainSc:          Complications: No apparent anesthesia complications

## 2016-10-08 NOTE — Progress Notes (Signed)
Notified Dr. Reesa Chew of K+2.7. Awaiting orders.

## 2016-10-08 NOTE — Op Note (Signed)
[facility] Patient Name: Emily Cohen Procedure Date : 10/08/2016 MRN: [id number] Attending MD: Docia Chuck. Henrene Pastor , MD Date of Birth: 1950/04/30 CSN: [id number] Age: 66 Admit Type: Inpatient Procedure:                Upper GI endoscopy Indications:              Abdominal pain. Previous history of gastric ulcer. Providers:                Docia Chuck. Henrene Pastor, MD, Cleda Daub, RN, Corliss Parish, Technician Referring MD:             [facility] Medicines:                Monitored Anesthesia Care Complications:            No immediate complications. Estimated Blood Loss:     Estimated blood loss: none. Procedure:                Pre-Anesthesia Assessment:                           - Prior to the procedure, a History and Physical                            was performed, and patient medications and                            allergies were reviewed. The patient's tolerance of                            previous anesthesia was also reviewed. The risks                            and benefits of the procedure and the sedation                            options and risks were discussed with the patient.                            All questions were answered, and informed consent                            was obtained. Prior Anticoagulants: The patient has                            taken no previous anticoagulant or antiplatelet                            agents. ASA Grade Assessment: II - A patient with                            mild systemic disease. After reviewing the risks  and benefits, the patient was deemed in                            satisfactory condition to undergo the procedure.                           After obtaining informed consent, the endoscope was                            passed under direct vision. Throughout the                            procedure, the patient's blood pressure, pulse, and                    oxygen saturations were monitored continuously. The                            EG-[id number] ([id number]) scope was introduced through the                            mouth, and advanced to the second part of duodenum.                            The upper GI endoscopy was accomplished without                            difficulty. The patient tolerated the procedure                            well. Scope In: Scope Out: Findings:      The esophagus was normal except for incidental esophageal ring distally.      The stomach was normal.      The examined duodenum was normal.      The cardia and gastric fundus were normal on retroflexion. Impression:               - Normal EGD except for incidental large distal                            esophageal ring. Previous ulcer healed. No cause                            for pain found. Moderate Sedation:      none Recommendation:           1. PPI daily                           2. Avoid aspirin and NSAIDs (aside from medically                            important baby aspirin if needed).                           3. Resume diet  4. Return to the care of primary service to address                            non-GI issues                           FINDINGS REVIEWED WITH PATIENT. PATIENT GIVEN                            COPIES OF HER PROCEDURE REPORTS                           GI will sign off. Thank you                           . Procedure Code(s):        --- Professional ---                           [phone], Esophagogastroduodenoscopy, flexible,                            transoral; diagnostic, including collection of                            specimen(s) by brushing or washing, when performed                            (separate procedure) Diagnosis Code(s):        --- Professional ---                           R10.9, Unspecified abdominal pain CPT copyright 2016 [employer]. All rights  reserved. The codes documented in this report are preliminary and upon coder review may  be revised to meet current compliance requirements. Docia Chuck. Henrene Pastor, MD 10/08/2016 2:58:21 PM This report has been signed electronically. Number of Addenda: 0

## 2016-10-08 NOTE — Interval H&P Note (Signed)
History and Physical Interval Note:  10/08/2016 1:13 PM  Emily Cohen  has presented today for surgery, with the diagnosis of bloody diarrhea, constipation, microcytic anemia, bleeding gastric ulcer 05/2016.  The various methods of treatment have been discussed with the patient and family. After consideration of risks, benefits and other options for treatment, the patient has consented to  Procedure(s): ESOPHAGOGASTRODUODENOSCOPY (EGD) (N/A) COLONOSCOPY (N/A) as a surgical intervention .  The patient's history has been reviewed, patient examined, no change in status, stable for surgery.  I have reviewed the patient's chart and labs.  Questions were answered to the patient's satisfaction.     Scarlette Shorts

## 2016-10-08 NOTE — Progress Notes (Addendum)
PROGRESS NOTE    Annahi Short  ZOX:[id number] DOB: 1950/04/29 DOA: 10/07/2016 PCP: Benito Mccreedy, MD   Brief Narrative:  66 0 female with a history of alcohol abuse, diabetes, hypertension, hyperlipidemia, arthritis came to the ER with complaints of recurrent abdominal pain and transient lower GI bleeding. She does have a history of gastric ulcers from NSAID use. She has not been taking PPI. On rectal exam in the ER she was positive for Hemoccult. Gastroenterology planning on upper and lower endoscopy today.   Assessment & Plan:   Principal Problem:   Rectal bleeding Active Problems:   Hypertension   Diabetes mellitus   Alcohol use   Arthritis   HLD (hyperlipidemia)   Symptomatic anemia   Pancreatic calcification   Constipation  Rectal bleeding, improved -Episodic. No further inpatient rectal bleeding noted -Unknown etiology at this time. GI has been consulted who plans on doing upper and lower endoscopy today -Hemoglobin and vital signs are stable we will continue closely monitor  Symptomatic anemia -No acute indication for transfusion at this time. Continue to follow hemoglobin -Hydration as needed  Lumbar lesions noted -This was seen on CT of the abdomen pelvis. She does have extensive history of smoking in the past -We'll try and get a CT of the chest with contrast later today or tomorrow. She'll likely need a biopsy of the lesion and/or bone scan for further evaluation -Multiple myeloma?-Renal function is stable, anemia is likely secondary to GI bleed, calcium within normal limits. Consider sending UPEP and SPEP  Hypokalemia -IV repletion ordered this morning as she is nothing by mouth. Checking magnesium as well.  History of alcohol use -She states she has not had any alcohol since February.  History of osteoarthritis -In the past she had been using a lot of NSAIDs. Asked to avoid using NSAIDs due to history of gastric ulcers and bleeding  Pancreatic  calcification -GI following  Diabetes type 2 -No longer any medication due to improved hemoglobin of 5.3 in February 2018  Hyperlipidemia -On outpatient statin  DVT prophylaxis: SCDs  Code Status: Full code Family Communication:  None at bedside Disposition Plan: To be determined. Will change her to inpatient as she still needs IV potassium repletion and continues to have back pain   It is my clinical opinion that admission to INPATIENT is reasonable and necessary in this 66 y.o. female . presenting with symptoms of rectal bleeding, concerning for Lower GI Bleed or gastric ulcer. Also has Hypokalemia. Persistent back pain with possible malignant lesion  . in the context of PMH including: Tobacco use, HTN, DM2, OA . with pertinent positives on physical exam including: Tenderness to palpation in back area . and pertinent positives on radiographic and laboratory data including: Hypokalemia, Lumbar lesions on CT A/P . Workup and treatment include needs EGD and C scope. IV potassium repletion. Further monitoring to ensure resolution of rectal bleeding   Given the aforementioned, the predictability of an adverse outcome is felt to be significant. I expect that the patient will require at least 2 midnights in the hospital to treat this condition.   Consultants:   Gastroenterology  Procedures:   None so far but planning on EGD and colonoscopy today  Antimicrobials:   None   Subjective: Patient does not have any complaints this morning. She is eager to get her endoscopies done. Still reports of low back pain especially with movement.   She admits of smoking heavily 1-2 pack daily for over 30 years. She does  show concern when I explained her lumbar lesion findings and would like to get as much workup done prior to getting discharge.  Objective: Vitals:   10/07/16 1843 10/07/16 2129 10/08/16 0530 10/08/16 0918  BP: (!) 141/54 (!) 137/55 (!) 154/71 (!) 154/82  Pulse: 83 91 90 86    Resp: '18 17 17 16  ' Temp: 98.5 F (36.9 C) 98.5 F (36.9 C) 97.9 F (36.6 C) 98 F (36.7 C)  TempSrc: Oral Oral Oral Oral  SpO2: 97% 99% 99% 100%  Weight:  62.2 kg (137 lb 1.6 oz)    Height:        Intake/Output Summary (Last 24 hours) at 10/08/16 1202 Last data filed at 10/08/16 0920  Gross per 24 hour  Intake          2651.67 ml  Output                0 ml  Net          2651.67 ml   Filed Weights   10/07/16 0912 10/07/16 2129  Weight: 67.1 kg (148 lb) 62.2 kg (137 lb 1.6 oz)    Examination:  General exam: Appears calm and comfortable ; old frail-appearing Respiratory system: Clear to auscultation. Respiratory effort normal. Cardiovascular system: S1 & S2 heard, RRR. No JVD, murmurs, rubs, gallops or clicks. No pedal edema. Gastrointestinal system: Abdomen is nondistended, soft and nontender. No organomegaly or masses felt. Normal bowel sounds heard. Central nervous system: Alert and oriented. No focal neurological deficits. Extremities: Symmetric 5 x 5 power. Skin: No rashes, lesions or ulcers Psychiatry: Judgement and insight appear normal. Mood & affect appropriate.  Tenderness in the lumbar area with deep palpation   Data Reviewed:   CBC:  Recent Labs Lab 10/01/16 2049 10/04/16 1456 10/07/16 1002 10/08/16 0352  WBC 12.7* 15.9* 10.9* 10.9*  NEUTROABS  --  11.9*  --   --   HGB 12.3 13.7 11.9* 11.5*  HCT 36.1 41.6 35.2* 34.6*  MCV 72.6* 74.8* 73.0* 73.8*  PLT 453* 438* 467* 094*   Basic Metabolic Panel:  Recent Labs Lab 10/01/16 2049 10/04/16 1456 10/07/16 1002 10/08/16 0352  NA 141 140 138 141  K 3.0* 3.7 4.2 2.7*  CL 106 111 104 109  CO2 24 18* 24 22  GLUCOSE 95 99 108* 96  BUN '11 6 9 6  ' CREATININE 0.55 0.64 0.65 0.55  CALCIUM 9.6 9.8 9.6 9.1   GFR: Estimated Creatinine Clearance: 68.2 mL/min (by C-G formula based on SCr of 0.55 mg/dL). Liver Function Tests:  Recent Labs Lab 10/01/16 2049 10/04/16 1456 10/07/16 1002 10/08/16 0352   AST 14* 17 19 12*  ALT 13* 13* 8* 13*  ALKPHOS 120 139* 140* 126  BILITOT 0.4 0.6 1.5* 0.9  PROT 7.5 7.3 6.6 6.4*  ALBUMIN 4.0 3.9 3.5 3.3*    Recent Labs Lab 10/01/16 2049 10/04/16 1456 10/07/16 1230  LIPASE 27 81* 32   No results for input(s): AMMONIA in the last 168 hours. Coagulation Profile:  Recent Labs Lab 10/07/16 1002  INR 1.04   Cardiac Enzymes: No results for input(s): CKTOTAL, CKMB, CKMBINDEX, TROPONINI in the last 168 hours. BNP (last 3 results) No results for input(s): PROBNP in the last 8760 hours. HbA1C: No results for input(s): HGBA1C in the last 72 hours. CBG: No results for input(s): GLUCAP in the last 168 hours. Lipid Profile: No results for input(s): CHOL, HDL, LDLCALC, TRIG, CHOLHDL, LDLDIRECT in the last 72 hours. Thyroid Function Tests:  No results for input(s): TSH, T4TOTAL, FREET4, T3FREE, THYROIDAB in the last 72 hours. Anemia Panel: No results for input(s): VITAMINB12, FOLATE, FERRITIN, TIBC, IRON, RETICCTPCT in the last 72 hours. Sepsis Labs: No results for input(s): PROCALCITON, LATICACIDVEN in the last 168 hours.  No results found for this or any previous visit (from the past 240 hour(s)).       Radiology Studies: Dg Abd 1 View  Result Date: 10/07/2016 CLINICAL DATA:  Constipation.  Left-sided abdominal pain.  Nausea. EXAM: ABDOMEN - 1 VIEW COMPARISON:  CT scan dated 10/01/2016 FINDINGS: The bowel gas pattern is normal. No radio-opaque calculi or other significant radiographic abnormality are seen. IMPRESSION: Benign-appearing abdomen and pelvis. No excessive stool in the colon. No fecal impaction. Electronically Signed   By: Lorriane Shire M.D.   On: 10/07/2016 14:09        Scheduled Meds: . pantoprazole  40 mg Oral Q0600  . sucralfate  1 g Oral QID  . traMADol  50 mg Oral TID  . traZODone  50 mg Oral QHS  . venlafaxine XR  75 mg Oral Q breakfast   Continuous Infusions: . sodium chloride 100 mL/hr at 10/08/16 1041  .  potassium chloride 10 mEq (10/08/16 1156)     LOS: 0 days    Time spent: 35 mins    Ankit Arsenio Loader, MD [facility] Pager [phone]   If 7PM-7AM, please contact night-coverage [email or web address] Password [facility] 10/08/2016, 12:02 PM

## 2016-10-08 NOTE — Op Note (Signed)
[facility] Patient Name: Emily Cohen Procedure Date : 10/08/2016 MRN: [id number] Attending MD: Docia Chuck. Henrene Pastor , MD Date of Birth: [birth year] CSN: [id number] Age: 66 Admit Type: Inpatient Procedure:                Colonoscopy, with cold snare polypectomy X5 Indications:              Abdominal pain in the left lower quadrant, Rectal                            bleeding Providers:                Docia Chuck. Henrene Pastor, MD, Cleda Daub, RN, Corliss Parish, Technician Referring MD:             [facility] Medicines:                Monitored Anesthesia Care Complications:            No immediate complications. Estimated blood loss:                            None. Estimated Blood Loss:     Estimated blood loss: none. Procedure:                Pre-Anesthesia Assessment:                           - Prior to the procedure, a History and Physical                            was performed, and patient medications and                            allergies were reviewed. The patient's tolerance of                            previous anesthesia was also reviewed. The risks                            and benefits of the procedure and the sedation                            options and risks were discussed with the patient.                            All questions were answered, and informed consent                            was obtained. Prior Anticoagulants: The patient has                            taken no previous anticoagulant or antiplatelet  agents. ASA Grade Assessment: II - A patient with                            mild systemic disease. After reviewing the risks                            and benefits, the patient was deemed in                            satisfactory condition to undergo the procedure.                           After obtaining informed consent, the colonoscope                            was passed under  direct vision. Throughout the                            procedure, the patient's blood pressure, pulse, and                            oxygen saturations were monitored continuously. The                            EC-[id number] ([id number]) scope was introduced through                            the anus and advanced to the the cecum, identified                            by appendiceal orifice and ileocecal valve. The                            ileocecal valve, appendiceal orifice, and rectum                            were photographed. The quality of the bowel                            preparation was excellent. The colonoscopy was                            performed without difficulty. The patient tolerated                            the procedure well. The bowel preparation used was                            MoviPrep. Scope In: 2:12:03 PM Scope Out: 2:35:59 PM Scope Withdrawal Time: 0 hours 17 minutes 32 seconds  Total Procedure Duration: 0 hours 23 minutes 56 seconds  Findings:      Five sessile polyps were found in the transverse colon, hepatic flexure       and ascending colon. The polyps were  2 to 7 mm in size. These polyps       were removed with a cold snare. Resection and retrieval were complete.      Multiple small-mouthed diverticula were found in the left colon.      Internal hemorrhoids were found during retroflexion. The hemorrhoids       were moderate.      The exam was otherwise without abnormality on direct and retroflexion       views. Impression:               - Five 2 to 7 mm polyps in the transverse colon, at                            the hepatic flexure and in the ascending colon,                            removed with a cold snare. Resected and retrieved.                           - Diverticulosis in the left colon.                           - Internal hemorrhoids. This is the cause of her                            rectal bleeding.                           -  The examination was otherwise normal on direct                            and retroflexion views. Moderate Sedation:      none Recommendation:           - Repeat colonoscopy in 3 - 5 years for                            surveillance. We will send her a letter with the                            results and follow-up recommendation.                           - EGD today. Please see report regarding findings                            and final recommendations.                           - Await pathology results. Procedure Code(s):        --- Professional ---                           [phone], Colonoscopy, flexible; with removal of  tumor(s), polyp(s), or other lesion(s) by snare                            technique Diagnosis Code(s):        --- Professional ---                           D12.3, Benign neoplasm of transverse colon (hepatic                            flexure or splenic flexure)                           D12.2, Benign neoplasm of ascending colon                           K64.8, Other hemorrhoids                           R10.32, Left lower quadrant pain                           K62.5, Hemorrhage of anus and rectum                           K57.30, Diverticulosis of large intestine without                            perforation or abscess without bleeding CPT copyright 2016 [employer]. All rights reserved. The codes documented in this report are preliminary and upon coder review may  be revised to meet current compliance requirements. Docia Chuck. Henrene Pastor, MD 10/08/2016 2:44:40 PM This report has been signed electronically. Number of Addenda: 0

## 2016-10-08 NOTE — Anesthesia Procedure Notes (Signed)
Procedure Name: MAC Date/Time: 10/08/2016 2:10 PM Performed by: Eligha Bridegroom Pre-anesthesia Checklist: Patient identified, Emergency Drugs available, Suction available, Patient being monitored and Timeout performed Patient Re-evaluated:Patient Re-evaluated prior to inductionPreoxygenation: Pre-oxygenation with 100% oxygen Intubation Type: IV induction

## 2016-10-08 NOTE — Anesthesia Preprocedure Evaluation (Signed)
Anesthesia Evaluation  Patient identified by MRN, date of birth, ID band Patient awake    Reviewed: Allergy & Precautions, NPO status , Patient's Chart, lab work & pertinent test results  Airway Mallampati: II  TM Distance: >3 FB Neck ROM: Full    Dental no notable dental hx.    Pulmonary neg pulmonary ROS, Current Smoker,    Pulmonary exam normal breath sounds clear to auscultation       Cardiovascular hypertension, Pt. on medications Normal cardiovascular exam Rhythm:Regular Rate:Normal     Neuro/Psych negative neurological ROS  negative psych ROS   GI/Hepatic PUD, GERD  Medicated,(+)     substance abuse  alcohol use,   Endo/Other  negative endocrine ROS  Renal/GU negative Renal ROS  negative genitourinary   Musculoskeletal negative musculoskeletal ROS (+)   Abdominal   Peds negative pediatric ROS (+)  Hematology negative hematology ROS (+)   Anesthesia Other Findings   Reproductive/Obstetrics negative OB ROS                             Anesthesia Physical Anesthesia Plan  ASA: II  Anesthesia Plan: MAC   Post-op Pain Management:    Induction:   PONV Risk Score and Plan: 2 and Treatment may vary due to age or medical condition  Airway Management Planned: Simple Face Mask  Additional Equipment:   Intra-op Plan:   Post-operative Plan: Extubation in OR  Informed Consent: I have reviewed the patients History and Physical, chart, labs and discussed the procedure including the risks, benefits and alternatives for the proposed anesthesia with the patient or authorized representative who has indicated his/her understanding and acceptance.   Dental advisory given  Plan Discussed with: CRNA  Anesthesia Plan Comments:         Anesthesia Quick Evaluation

## 2016-10-09 ENCOUNTER — Encounter: Payer: Self-pay | Admitting: Internal Medicine

## 2016-10-09 ENCOUNTER — Inpatient Hospital Stay (HOSPITAL_COMMUNITY): Payer: Medicare Other

## 2016-10-09 DIAGNOSIS — Z720 Tobacco use: Secondary | ICD-10-CM

## 2016-10-09 DIAGNOSIS — K625 Hemorrhage of anus and rectum: Secondary | ICD-10-CM | POA: Diagnosis not present

## 2016-10-09 DIAGNOSIS — R42 Dizziness and giddiness: Secondary | ICD-10-CM | POA: Diagnosis not present

## 2016-10-09 DIAGNOSIS — M549 Dorsalgia, unspecified: Secondary | ICD-10-CM

## 2016-10-09 DIAGNOSIS — Z122 Encounter for screening for malignant neoplasm of respiratory organs: Secondary | ICD-10-CM

## 2016-10-09 DIAGNOSIS — J40 Bronchitis, not specified as acute or chronic: Secondary | ICD-10-CM

## 2016-10-09 DIAGNOSIS — K648 Other hemorrhoids: Secondary | ICD-10-CM | POA: Diagnosis not present

## 2016-10-09 DIAGNOSIS — M519 Unspecified thoracic, thoracolumbar and lumbosacral intervertebral disc disorder: Secondary | ICD-10-CM

## 2016-10-09 MED ORDER — POTASSIUM CHLORIDE CRYS ER 20 MEQ PO TBCR
40.0000 meq | EXTENDED_RELEASE_TABLET | Freq: Once | ORAL | Status: AC
  Administered 2016-10-09: 40 meq via ORAL
  Filled 2016-10-09: qty 2

## 2016-10-09 MED ORDER — IOPAMIDOL (ISOVUE-300) INJECTION 61%
INTRAVENOUS | Status: AC
  Administered 2016-10-09: 75 mL
  Filled 2016-10-09: qty 75

## 2016-10-09 MED ORDER — DOCUSATE SODIUM 100 MG PO CAPS: 100.0000 mg | ORAL_CAPSULE | Freq: Two times a day (BID) | ORAL | 1 refills | Status: DC | PRN

## 2016-10-09 MED ORDER — SENNA 8.6 MG PO TABS: 1.0000 | ORAL_TABLET | Freq: Every day | ORAL | 0 refills | Status: AC | PRN

## 2016-10-09 MED ORDER — ACETAMINOPHEN-CODEINE #3 300-30 MG PO TABS: 1.0000 | ORAL_TABLET | Freq: Four times a day (QID) | ORAL | 0 refills | Status: DC | PRN

## 2016-10-09 MED ORDER — CARAFATE 1 GM/10ML PO SUSP: 1.0000 g | Freq: Four times a day (QID) | ORAL | 1 refills | Status: AC

## 2016-10-09 MED ORDER — PANTOPRAZOLE SODIUM 40 MG PO TBEC: 40.0000 mg | DELAYED_RELEASE_TABLET | Freq: Two times a day (BID) | ORAL | 0 refills | Status: AC

## 2016-10-09 NOTE — Care Management Note (Signed)
Case Management Note  Patient Details  Name: Emily Cohen MRN: [id number] Date of Birth: [birth year]  Subjective/Objective:                 Patient admitted from home, rectal bleeding. No CM needs at time of DC.    Action/Plan:  DC to home self care.  Expected Discharge Date:  10/09/16               Expected Discharge Plan:  Home/Self Care  In-House Referral:     Discharge planning Services  CM Consult  Post Acute Care Choice:    Choice offered to:     DME Arranged:    DME Agency:     HH Arranged:    HH Agency:     Status of Service:  Completed, signed off  If discussed at [employer] of Stay Meetings, dates discussed:    Additional Comments:  Carles Collet, RN 10/09/2016, 4:41 PM

## 2016-10-09 NOTE — Anesthesia Postprocedure Evaluation (Signed)
Anesthesia Post Note  Patient: Emily Cohen  Procedure(s) Performed: Procedure(s) (LRB): ESOPHAGOGASTRODUODENOSCOPY (EGD) (N/A) COLONOSCOPY (N/A)     Patient location during evaluation: Endoscopy Anesthesia Type: MAC Level of consciousness: awake and alert Pain management: pain level controlled Vital Signs Assessment: post-procedure vital signs reviewed and stable Respiratory status: spontaneous breathing, nonlabored ventilation, respiratory function stable and patient connected to nasal cannula oxygen Cardiovascular status: stable and blood pressure returned to baseline Anesthetic complications: no    Last Vitals:  Vitals:   10/09/16 0941 10/09/16 1703  BP: (!) 146/58 (!) 152/74  Pulse: 87 92  Resp: 16 20  Temp: 36.9 C 37 C    Last Pain:  Vitals:   10/09/16 1703  TempSrc: Oral  PainSc:    Pain Goal:                 Montez Hageman

## 2016-10-09 NOTE — Progress Notes (Signed)
Discharge instructions and medications discussed with patient.  Prescription given to patient.  All questions answered.  

## 2016-10-09 NOTE — Discharge Summary (Signed)
Physician Discharge Summary  Emily Cohen ULA:[id number] DOB: Jan 18, 1951 DOA: 10/07/2016  PCP: Benito Mccreedy, MD  Admit date: 10/07/2016 Discharge date: 10/09/2016  Admitted From: Home Disposition: Home  Recommendations for Outpatient Follow-up:  1. Follow up with PCP in 1-2 weeks 2. Needs CT of the chest with contrast outpatient (difficulty ordering inpatient, but I have called the Outpatient Radiology dept at [facility] who will arrange for it. I will provide the patient with the scheduling number [phone]) 3. Outpatient radiology scheduling number [phone]) 4. Needs MRI lumbar spine outpatient to evaluate for lumbar lesions (ordered) 5. We'll give her Tylenol 3 for pain. Will also order a bowel regimen. 6. Advised her to notify her PCP or return to ER if she notices further rectal bleeding and feels lightheaded, dizzy and short of breath.  Home Health: None Equipment/Devices: None Discharge Condition: None CODE STATUS: Full code Diet recommendation: Heart Healthy / Carb Modified / Regular / Dysphagia   Brief/Interim Summary: 66 year old female with past medical history of alcohol abuse, diabetes, hypertension, hyperlipidemia, arthritis came to the ER with concerns of recurrent abdominal pain and transient lower GI bleeding. Apparently she has a history of gastric ulcer and use of NSAIDs due to arthritis therefore gastroenterology was consulted. She underwent upper and lower endoscopy on 10/08/2016 which showed several 2-7 millimeter polyps in transverse colon but they were not removed as it didn't seem to be culprit for her bleeding. She was noted to have antral hemorrhoids which was likely the cause. Her hemoglobin has remained stable and so are vital signs. Her abdominal pain is much better today. Patient was here in the ER on 10/01/2016 with abdominal pain and CT of the abdomen pelvis was done. On the radiology report it was mentioned patient has multiple lumbar lesions  largest being on L2 and was asked to follow-up outpatient. I spoke with Dr. Benito Mccreedy (PCP) her outpatient physician this morning regarding this finding as she will definitely need further workup including biopsy of these lesions. Due to her extensive smoking history I'll go ahead and order CT of the chest with contrast and MRI of lumbar spine which his PCP stated will follow-up outpatient and will call the patient back with the appointment within next 1-2 weeks. I spoke with patient regarding these findings extensively as well and she confirms that she will follow-up outpatient with her primary care physician and also get the above-stated imaging with outpatient radiology early next week. She understands the risk as this lesions could be malignant. Given her hemoglobin is stable and is no longer symptomatic she is medically deemed stable and has reached maximum inpatient hospital benefit. Will discharge her today with outpatient follow-up as mentioned above. Excellent Seen and examined this morning patient does not any complaints and doing better. No acute overnight events. She tolerated EGD and colonoscopy well yesterday.  EGD done on 10/08/2016 showed large distal esophageal ring and well-healed previous ulcer. Advised to continue PPI per gastroenterology   Discharge Diagnoses:  Principal Problem:   Rectal bleeding Active Problems:   Hypertension   Diabetes mellitus   Alcohol use   Arthritis   HLD (hyperlipidemia)   Symptomatic anemia   Pancreatic calcification   Constipation   Benign neoplasm of ascending colon   Benign neoplasm of transverse colon   Diverticulosis of colon without hemorrhage   Generalized abdominal pain   Lower esophageal ring   Back pain   Lumbar disc lesion   Bronchitis due to tobacco  use [facility])   Encounter for screening for lung cancer   Rectal bleeding, resolved -Episodic. No further inpatient rectal bleeding noted -Colonoscopy showed 2-7 millimeter  several polyps in transverse colon but no polypectomy was performed. EGD showed large distal esophageal ring and was advised to continue PPI. -I have discussed this finding with her outpatient PCP  Symptomatic anemia -Resolved  Lumbar lesions noted -CT of the abdomen pelvis done on 10/01/2016 results discussed with her primary care physician by me this morning. Will order outpatient CT of the chest with contrast and MRI lumbar spine which will be followed up by her primary care physician and further action will be taken depending on the findings. Needs to call scheduling number [phone]. I had difficulty ordering the CT chest with contrast inpatient therefore spoke with outpatient radiology service at [facility] who states they will try and sort it out so she can get it and order can be placed at that time.  -We'll order Tylenol #3 for pain control along with bowel regimen  Hypokalemia -Improving  History of alcohol use -She states she has not had any alcohol since February.  History of osteoarthritis -In the past she had been using a lot of NSAIDs. Asked to avoid using NSAIDs due to history of gastric ulcers and bleeding  Pancreatic calcification -Evaluated by GI  Diabetes type 2 -No longer any medication due to improved hemoglobin of 5.3 in February 2018  Hyperlipidemia -On outpatient statin  Discharge Instructions   Allergies as of 10/09/2016   No Known Allergies     Medication List    TAKE these medications   acetaminophen 500 MG tablet Commonly known as:  TYLENOL Take 1 tablet (500 mg total) by mouth every 6 (six) hours as needed. What changed:  reasons to take this   acetaminophen-codeine 300-30 MG tablet Commonly known as:  TYLENOL #3 Take 1 tablet by mouth every 6 (six) hours as needed for moderate pain or severe pain.   amLODipine 10 MG tablet Commonly known as:  NORVASC Take 10 mg by mouth daily.   bisacodyl 5 MG EC tablet Commonly known as:   DULCOLAX Take 1 tablet (5 mg total) by mouth 2 (two) times daily.   CARAFATE 1 GM/10ML suspension Generic drug:  sucralfate Take 10 mLs by mouth 4 (four) times daily.   cyclobenzaprine 5 MG tablet Commonly known as:  FLEXERIL Take 5 mg by mouth 3 (three) times daily as needed for muscle spasms.   diclofenac sodium 1 % Gel Commonly known as:  VOLTAREN Apply 2 g topically 4 (four) times daily. Apply to right shoulder. What changed:  when to take this  reasons to take this  additional instructions   docusate sodium 100 MG capsule Commonly known as:  COLACE Take 1 capsule (100 mg total) by mouth 2 (two) times daily as needed.   HYDROcodone-acetaminophen 5-325 MG tablet Commonly known as:  NORCO/VICODIN Take 1 tablet by mouth every 6 (six) hours as needed for severe pain.   lovastatin 20 MG tablet Commonly known as:  MEVACOR Take 20 mg by mouth daily.   ondansetron 4 MG tablet Commonly known as:  ZOFRAN Take 4 mg by mouth 3 (three) times daily as needed for nausea or vomiting.   pantoprazole 40 MG tablet Commonly known as:  PROTONIX Take 1 tablet (40 mg total) by mouth 2 (two) times daily. What changed:  when to take this   promethazine 25 MG tablet Commonly known as:  PHENERGAN  Take 25 mg by mouth every 6 (six) hours.   senna 8.6 MG Tabs tablet Commonly known as:  SENOKOT Take 1 tablet (8.6 mg total) by mouth daily as needed for mild constipation.   sodium phosphate 7-19 GM/118ML Enem Place 133 mLs (1 enema total) rectally daily as needed for severe constipation.   traMADol 50 MG tablet Commonly known as:  ULTRAM Take 50 mg by mouth 3 (three) times daily.   traZODone 50 MG tablet Commonly known as:  DESYREL Take 50 mg by mouth at bedtime.   venlafaxine XR 75 MG 24 hr capsule Commonly known as:  EFFEXOR-XR Take 75 mg by mouth daily with breakfast.      Follow-up Information    Benito Mccreedy, MD Follow up.   Specialty:  Internal Medicine Contact  information: [address] [place] [address] [phone]          No Known Allergies  On your next visit with your primary care physician please Get Medicines reviewed and adjusted.   Please request your Prim.MD to go over all Hospital Tests and Procedure/Radiological results at the follow up, please get all Hospital records sent to your Prim MD by signing hospital release before you go home.   If you experience worsening of your admission symptoms, develop shortness of breath, life threatening emergency, suicidal or homicidal thoughts you must seek medical attention immediately by calling 911 or calling your MD immediately  if symptoms less severe.  You Must read complete instructions/literature along with all the possible adverse reactions/side effects for all the Medicines you take and that have been prescribed to you. Take any new Medicines after you have completely understood and accpet all the possible adverse reactions/side effects.   Do not drive, operate heavy machinery, perform activities at heights, swimming or participation in water activities or provide baby sitting services if your were admitted for syncope or siezures until you have seen by Primary MD or a Neurologist and advised to do so again.  Do not drive when taking Pain medications.    Do not take more than prescribed Pain, Sleep and Anxiety Medications  Special Instructions: If you have smoked or chewed Tobacco  in the last 2 yrs please stop smoking, stop any regular Alcohol  and or any Recreational drug use.  Wear Seat belts while driving.   Please note  You were cared for by a hospitalist during your hospital stay. If you have any questions about your discharge medications or the care you received while you were in the hospital after you are discharged, you can call the unit and asked to speak with the hospitalist on call if the hospitalist that took care of you is not  available. Once you are discharged, your primary care physician will handle any further medical issues. Please note that NO REFILLS for any discharge medications will be authorized once you are discharged, as it is imperative that you return to your primary care physician (or establish a relationship with a primary care physician if you do not have one) for your aftercare needs so that they can reassess your need for medications and monitor your lab values.   Increase activity slowly        Consultations: Gastroenterology  Procedures/Studies: Dg Ribs Unilateral W/chest Left  Result Date: 10/04/2016 CLINICAL DATA:  Left below diaphragm anterior to lateral rib pain x 1 month with some SOB.(Pt. [place] in January 2018 and injured left humerus.) No new fall, says patient. EXAM:  LEFT RIBS AND CHEST - 3+ VIEW COMPARISON:  None. FINDINGS: No rib fracture or rib lesion. Heart, mediastinum and hila are unremarkable. Lungs are clear. No pleural effusion or pneumothorax. IMPRESSION: Negative. Electronically Signed   By: Lajean Manes M.D.   On: 10/04/2016 16:01   Dg Abd 1 View  Result Date: 10/07/2016 CLINICAL DATA:  Constipation.  Left-sided abdominal pain.  Nausea. EXAM: ABDOMEN - 1 VIEW COMPARISON:  CT scan dated 10/01/2016 FINDINGS: The bowel gas pattern is normal. No radio-opaque calculi or other significant radiographic abnormality are seen. IMPRESSION: Benign-appearing abdomen and pelvis. No excessive stool in the colon. No fecal impaction. Electronically Signed   By: Lorriane Shire M.D.   On: 10/07/2016 14:09   Ct Abdomen Pelvis W Contrast  Result Date: 10/01/2016 CLINICAL DATA:  Acute onset of generalized abdominal and back pain. Initial encounter. EXAM: CT ABDOMEN AND PELVIS WITH CONTRAST TECHNIQUE: Multidetector CT imaging of the abdomen and pelvis was performed using the standard protocol following bolus administration of intravenous contrast. CONTRAST:  124m ISOVUE-300 IOPAMIDOL (ISOVUE-300)  INJECTION 61% COMPARISON:  CT of the abdomen and pelvis from 05/23/2012 FINDINGS: Lower chest: The visualized lung bases are grossly clear. The visualized portions of the mediastinum are unremarkable. A left-sided breast implant is partially imaged. Hepatobiliary: A 6 mm nonspecific hypodensity is noted at the right hepatic lobe. The liver is otherwise unremarkable. A small calcification is again noted at the fundus of the gallbladder, stable from 2014. The gallbladder is otherwise unremarkable. The common bile duct remains normal in caliber. Pancreas: Calcification at the pancreatic head is somewhat more prominent than in 2014, reflecting chronic sequelae of pancreatitis. The patient's lipase is normal, suggesting against acute pancreatitis. Spleen: The spleen is unremarkable in appearance. Adrenals/Urinary Tract: The adrenal glands are unremarkable in appearance. Scattered bilateral renal cysts are seen. There is no evidence of hydronephrosis. No renal or ureteral stones are identified. No perinephric stranding is appreciated. Stomach/Bowel: The stomach is unremarkable in appearance. The small bowel is within normal limits. The appendix is normal in caliber, without evidence of appendicitis. The colon is unremarkable in appearance. Vascular/Lymphatic: Scattered calcification is seen along the abdominal aorta and its branches. The abdominal aorta is otherwise grossly unremarkable. The inferior vena cava is grossly unremarkable. No retroperitoneal lymphadenopathy is seen. No pelvic sidewall lymphadenopathy is identified. Reproductive: The bladder is mildly distended and grossly unremarkable. The patient is status post hysterectomy. No suspicious adnexal masses are seen. Other: No additional soft tissue abnormalities are seen. Musculoskeletal: No acute osseous abnormalities are identified. A focal lucency at the superior endplate of L2 could reflect a lytic lesion. There appear to be multiple additional abnormal  lucencies throughout the lumbar spine. There is mild chronic compression deformity of vertebral body T12. The visualized musculature is unremarkable in appearance. IMPRESSION: 1. No acute abnormality seen to explain the patient's symptoms. 2. Focal lucencies noted at multiple levels along the lumbar spine, the largest of which is seen at L2. This is concerning for metastatic disease or multiple myeloma. Bone scan would be helpful for further evaluation. 3. Scattered aortic atherosclerosis. 4. Calcification at the pancreatic head is somewhat more prominent than in 2014, reflecting chronic sequelae of pancreatitis. 5. Scattered bilateral renal cysts noted. 6. Mild chronic compression deformity of vertebral body T12. 7. Stable calcification at the fundus of the gallbladder. Gallbladder otherwise unremarkable. 8. 6 mm nonspecific hypodensity at the right hepatic lobe is grossly stable and likely benign. Electronically Signed   By: JFrancoise SchaumannD.  On: 10/01/2016 22:48      Subjective:   Discharge Exam: Vitals:   10/09/16 0650 10/09/16 0941  BP: 123/65 (!) 146/58  Pulse: 86 87  Resp: 17 16  Temp: 98.7 F (37.1 C) 98.5 F (36.9 C)   Vitals:   10/08/16 1747 10/08/16 2216 10/09/16 0650 10/09/16 0941  BP: (!) 133/52 (!) 135/59 123/65 (!) 146/58  Pulse: 93 82 86 87  Resp: '18 16 17 16  ' Temp: 98.6 F (37 C) 98.7 F (37.1 C) 98.7 F (37.1 C) 98.5 F (36.9 C)  TempSrc: Oral Oral Oral Oral  SpO2: 100% 98% 96% 99%  Weight:  62 kg (136 lb 11 oz)    Height:        General: Pt is alert, awake, not in acute distress Cardiovascular: RRR, S1/S2 +, no rubs, no gallops Respiratory: CTA bilaterally, no wheezing, no rhonchi Abdominal: Soft, NT, ND, bowel sounds + Extremities: no edema, no cyanosis    The results of significant diagnostics from this hospitalization (including imaging, microbiology, ancillary and laboratory) are listed below for reference.     Microbiology: No results found  for this or any previous visit (from the past 240 hour(s)).   Labs: BNP (last 3 results) No results for input(s): BNP in the last 8760 hours. Basic Metabolic Panel:  Recent Labs Lab 10/04/16 1456 10/07/16 1002 10/08/16 0352 10/08/16 1529  NA 140 138 141 142  K 3.7 4.2 2.7* 3.4*  CL 111 104 109 113*  CO2 18* '24 22 22  ' GLUCOSE 99 108* 96 92  BUN '6 9 6 ' 5*  CREATININE 0.64 0.65 0.55 0.57  CALCIUM 9.8 9.6 9.1 9.0  MG  --   --   --  1.4*   Liver Function Tests:  Recent Labs Lab 10/04/16 1456 10/07/16 1002 10/08/16 0352  AST 17 19 12*  ALT 13* 8* 13*  ALKPHOS 139* 140* 126  BILITOT 0.6 1.5* 0.9  PROT 7.3 6.6 6.4*  ALBUMIN 3.9 3.5 3.3*    Recent Labs Lab 10/04/16 1456 10/07/16 1230  LIPASE 81* 32   No results for input(s): AMMONIA in the last 168 hours. CBC:  Recent Labs Lab 10/04/16 1456 10/07/16 1002 10/08/16 0352  WBC 15.9* 10.9* 10.9*  NEUTROABS 11.9*  --   --   HGB 13.7 11.9* 11.5*  HCT 41.6 35.2* 34.6*  MCV 74.8* 73.0* 73.8*  PLT 438* 467* 447*   Cardiac Enzymes: No results for input(s): CKTOTAL, CKMB, CKMBINDEX, TROPONINI in the last 168 hours. BNP: Invalid input(s): POCBNP CBG: No results for input(s): GLUCAP in the last 168 hours. D-Dimer No results for input(s): DDIMER in the last 72 hours. Hgb A1c No results for input(s): HGBA1C in the last 72 hours. Lipid Profile No results for input(s): CHOL, HDL, LDLCALC, TRIG, CHOLHDL, LDLDIRECT in the last 72 hours. Thyroid function studies No results for input(s): TSH, T4TOTAL, T3FREE, THYROIDAB in the last 72 hours.  Invalid input(s): FREET3 Anemia work up No results for input(s): VITAMINB12, FOLATE, FERRITIN, TIBC, IRON, RETICCTPCT in the last 72 hours. Urinalysis    Component Value Date/Time   COLORURINE STRAW (A) 10/01/2016 2141   APPEARANCEUR CLEAR 10/01/2016 2141   LABSPEC 1.011 10/01/2016 2141   PHURINE 5.0 10/01/2016 2141   GLUCOSEU NEGATIVE 10/01/2016 2141   HGBUR NEGATIVE  10/01/2016 2141   BILIRUBINUR NEGATIVE 10/01/2016 2141   KETONESUR NEGATIVE 10/01/2016 2141   PROTEINUR NEGATIVE 10/01/2016 2141   UROBILINOGEN 0.2 02/20/2013 1609   NITRITE NEGATIVE 10/01/2016 2141  LEUKOCYTESUR NEGATIVE 10/01/2016 2141   Sepsis Labs Invalid input(s): PROCALCITONIN,  WBC,  LACTICIDVEN Microbiology No results found for this or any previous visit (from the past 240 hour(s)).   Time coordinating discharge: Over 30 minutes  SIGNED:   Damita Lack, MD  [facility] 10/09/2016, 12:29 PM Pager   If 7PM-7AM, please contact night-coverage [email or web address] Password [email or web address]

## 2016-10-09 NOTE — Clinical Social Work Note (Signed)
Patient discharged this afternoon. He requested and was given a bus ticket to get home.  Jezabel Lecker Givens, MSW, LCSW Licensed Clinical Social Worker [place] [phone]

## 2016-10-10 NOTE — Progress Notes (Signed)
@   13:58pm on 10-10-2016, Patient called and had me speak with her significant other (female).  This female did not provide me with his name.  The patient's significant other repeatedly rambling about this patient's case, not making any sense about his requests.  The patient's significant other hung up on this RN, after being frustrated.  Jillyn Ledger, MBA, BSN, RN

## 2016-10-11 ENCOUNTER — Encounter (HOSPITAL_COMMUNITY): Payer: Self-pay | Admitting: Internal Medicine

## 2016-10-12 ENCOUNTER — Encounter: Payer: Self-pay | Admitting: *Deleted

## 2016-10-13 ENCOUNTER — Encounter: Payer: Self-pay | Admitting: *Deleted

## 2016-10-15 ENCOUNTER — Telehealth: Payer: Self-pay | Admitting: Pulmonary Disease

## 2016-10-15 NOTE — Telephone Encounter (Signed)
Patient placed nurse on a hold to answer another call on the other line for over 5 minutes - nurse ended call.  If patient calls back please advise that we are sending this to Dr Halford Chessman for him to advise and we will call them back tomorrow as he is in office 10/16/16.

## 2016-10-15 NOTE — Telephone Encounter (Signed)
Spoke with patient husband, states that they are needing a referral for the patient to the [facility].  Pt seen in hospital by our Physicians in Feb.  Husband states that he was told that our office needed to do a referral based on recent 10/09/16 CT scan.  Pt never seen in our office post hospital for [place].   Please advise Dr Halford Chessman on referral to [facility] - I do not see anything mentioned in the chart about this.  Pt husband mentioned your name and wanted referral from you - Patient seen also by Dr Nelda Marseille and Dr Ashok Cordia.

## 2016-10-15 NOTE — Telephone Encounter (Signed)
I spoke with patient's husband-he is aware that I spoke with JN in VS's absence. Wife has only been seen in hospital by a few of our providers.Aware that no where in the chart does it state patient has lung cancer-therefore patient will need to be seen in our office and then possibly have PET scan and biopsy if needed.    I emailed VS and he is aware of the above as well. Pt and spouse are aware of appt with MW on Friday 10-16-16 at 9:00am. Nothing more needed at this time.

## 2016-10-16 ENCOUNTER — Encounter: Payer: Self-pay | Admitting: Internal Medicine

## 2016-10-16 ENCOUNTER — Ambulatory Visit (INDEPENDENT_AMBULATORY_CARE_PROVIDER_SITE_OTHER): Payer: Medicare Other | Admitting: Internal Medicine

## 2016-10-16 VITALS — BP 124/74 | HR 105 | Ht 67.0 in | Wt 140.0 lb

## 2016-10-16 DIAGNOSIS — R911 Solitary pulmonary nodule: Secondary | ICD-10-CM | POA: Diagnosis not present

## 2016-10-16 DIAGNOSIS — F17210 Nicotine dependence, cigarettes, uncomplicated: Secondary | ICD-10-CM | POA: Diagnosis not present

## 2016-10-16 DIAGNOSIS — J449 Chronic obstructive pulmonary disease, unspecified: Secondary | ICD-10-CM | POA: Insufficient documentation

## 2016-10-16 DIAGNOSIS — I10 Essential (primary) hypertension: Secondary | ICD-10-CM | POA: Diagnosis not present

## 2016-10-16 MED ORDER — CLONIDINE HCL 0.1 MG PO TABS: 0.1000 mg | ORAL_TABLET | Freq: Three times a day (TID) | ORAL | 11 refills | Status: DC

## 2016-10-16 MED ORDER — HYDROCODONE-ACETAMINOPHEN 5-325 MG PO TABS: 1.0000 | ORAL_TABLET | ORAL | 0 refills | Status: DC | PRN

## 2016-10-16 MED ORDER — PREDNISONE 10 MG PO TABS: ORAL_TABLET | ORAL | 0 refills | Status: DC

## 2016-10-16 MED ORDER — AZITHROMYCIN 250 MG PO TABS: ORAL_TABLET | ORAL | 0 refills | Status: DC

## 2016-10-16 NOTE — Assessment & Plan Note (Signed)
rec change to clonidine 0.1 mg tid to help with pain control

## 2016-10-16 NOTE — Assessment & Plan Note (Signed)
Spirometry 10/16/2016  Incomplete exhalation / min curvature     I reviewed the Fletcher curve with the patient that basically indicates  if you quit smoking when your best day FEV1 is still well preserved (as is clearly  the case here)  it is highly unlikely you will progress to severe disease and informed the patient there was  no medication on the market that has proven to alter the curve/ its downward trajectory  or the likelihood of progression of their disease(unlike other chronic medical conditions such as atheroclerosis where we do think we can change the natural hx with risk reducing meds)    Therefore stopping smoking and maintaining abstinence is the most important aspect of care, not choice of inhalers or for that matter, doctors.    To rx acutely rec zpak/ Prednisone 10 mg take  4 each am x 2 days,   2 each am x 2 days,  1 each am x 2 days and stop but does not required inhalers based on today's sprirometry

## 2016-10-16 NOTE — Assessment & Plan Note (Signed)

## 2016-10-16 NOTE — Assessment & Plan Note (Signed)
Strongly doubt this is met lung ca since primary is so small though could well have 2 sep tumors  Next step is PET for easiest site for bx  Discussed in detail all the  indications, usual  risks and alternatives  relative to the benefits with patient who agrees to proceed with w/u as outlined  And in meantime treat back pain more aggressively with hydocodone and clonidine   Total time devoted to counseling  > 50 % of initial 60 min office visit:  review case with pt/ discussion of options/alternatives/ personally creating written customized instructions  in presence of pt  then going over those specific  Instructions directly with the pt including how to use all of the meds but in particular covering each new medication in detail and the difference between the maintenance= "automatic" meds and the prns using an action plan format for the latter (If this problem/symptom => do that organization reading Left to right).  Please see AVS from this visit for a full list of these instructions which I personally wrote for this pt and  are unique to this visit.

## 2016-10-16 NOTE — Progress Notes (Addendum)
Subjective:     Patient ID: Emily Cohen, female   DOB: 1950/10/05,     MRN: [id number]  HPI  66 yobf active smoker s/p L mastectomy for breast ca no RT/ chemo ? In 1990s new onset back > OseiBonsu > ? Lung ca >  referred to pulmonary clinic 10/16/2016 by Dr   Vista Lawman   Admit date: 10/07/2016 Discharge date: 10/09/2016  Admitted From: Home Disposition: Home  Recommendations for Outpatient Follow-up:  1. Follow up with PCP in 1-2 weeks 2. Needs CT of the chest with contrast outpatient (difficulty ordering inpatient, but I have called the Outpatient Radiology dept at [facility] who will arrange for it. I will provide the patient with the scheduling number [phone]) 3. Outpatient radiology scheduling number [phone]) 4. Needs MRI lumbar spine outpatient to evaluate for lumbar lesions (ordered) 5. We'll give her Tylenol 3 for pain. Will also order a bowel regimen. 6. Advised her to notify her PCP or return to ER if she notices further rectal bleeding and feels lightheaded, dizzy and short of breath.  Home Health: None Equipment/Devices: None Discharge Condition: None CODE STATUS: Full code Diet recommendation: Heart Healthy / Carb Modified / Regular / Dysphagia   Brief/Interim Summary: 66 year old female with past medical history of alcohol abuse, diabetes, hypertension, hyperlipidemia, arthritis came to the ER with concerns of recurrent abdominal pain and transient lower GI bleeding. Apparently she has a history of gastric ulcer and use of NSAIDs due to arthritis therefore gastroenterology was consulted. She underwent upper and lower endoscopy on 10/08/2016 which showed several 2-7 millimeter polyps in transverse colon but they were not removed as it didn't seem to be culprit for her bleeding. She was noted to have antral hemorrhoids which was likely the cause. Her hemoglobin has remained stable and so are vital signs. Her abdominal pain is much better today. Patient was  here in the ER on 10/01/2016 with abdominal pain and CT of the abdomen pelvis was done. On the radiology report it was mentioned patient has multiple lumbar lesions largest being on L2 and was asked to follow-up outpatient. I spoke with Dr. Benito Mccreedy (PCP) her outpatient physician this morning regarding this finding as she will definitely need further workup including biopsy of these lesions. Due to her extensive smoking history I'll go ahead and order CT of the chest with contrast and MRI of lumbar spine which his PCP stated will follow-up outpatient and will call the patient back with the appointment within next 1-2 weeks. I spoke with patient regarding these findings extensively as well and she confirms that she will follow-up outpatient with her primary care physician and also get the above-stated imaging with outpatient radiology early next week. She understands the risk as this lesions could be malignant. Given her hemoglobin is stable and is no longer symptomatic she is medically deemed stable and has reached maximum inpatient hospital benefit. Will discharge her today with outpatient follow-up as mentioned above. Excellent Seen and examined this morning patient does not any complaints and doing better. No acute overnight events. She tolerated EGD and colonoscopy well yesterday.  EGD done on 10/08/2016 showed large distal esophageal ring and well-healed previous ulcer. Advised to continue PPI per gastroenterology   Discharge Diagnoses:  Principal Problem:   Rectal bleeding Active Problems:   Hypertension   Diabetes mellitus   Alcohol use   Arthritis   HLD (hyperlipidemia)   Symptomatic anemia   Pancreatic calcification   Constipation  Benign neoplasm of ascending colon   Benign neoplasm of transverse colon   Diverticulosis of colon without hemorrhage   Generalized abdominal pain   Lower esophageal ring   Back pain   Lumbar disc lesion   Bronchitis due to tobacco use  ([place])   Encounter for screening for lung cancer   Rectal bleeding, resolved -Episodic. No further inpatient rectal bleeding noted -Colonoscopy showed 2-7 millimeter several polyps in transverse colon but no polypectomy was performed. EGD showed large distal esophageal ring and was advised to continue PPI. -I have discussed this finding with her outpatient PCP  Symptomatic anemia -Resolved  Lumbar lesions noted -CT of the abdomen pelvis done on 10/01/2016 results discussed with her primary care physician by me this morning. Will order outpatient CT of the chest with contrast and MRI lumbar spine which will be followed up by her primary care physician and further action will be taken depending on the findings. Needs to call scheduling number [phone]. I had difficulty ordering the CT chest with contrast inpatient therefore spoke with outpatient radiology service at [facility] who states they will try and sort it out so she can get it and order can be placed at that time.  -We'll order Tylenol #3 for pain control along with bowel regimen  Hypokalemia -Improving  History of alcohol use -She states she has not had any alcohol since February.  History of osteoarthritis -In the past she had been using a lot of NSAIDs. Asked to avoid using NSAIDs due to history of gastric ulcers and bleeding  Pancreatic calcification -Evaluated by GI  Diabetes type 2 -No longer any medication due to improved hemoglobin of 5.3 in February 2018  Hyperlipidemia -On outpatient statin  Discharge Instructions  Allergies as of 10/09/2016   No Known Allergies        Medication List    TAKE these medications   acetaminophen 500 MG tablet Commonly known as:  TYLENOL Take 1 tablet (500 mg total) by mouth every 6 (six) hours as needed. What changed:  reasons to take this   acetaminophen-codeine 300-30 MG tablet Commonly known as:  TYLENOL #3 Take 1 tablet by mouth every 6 (six) hours  as needed for moderate pain or severe pain.   amLODipine 10 MG tablet Commonly known as:  NORVASC Take 10 mg by mouth daily.   bisacodyl 5 MG EC tablet Commonly known as:  DULCOLAX Take 1 tablet (5 mg total) by mouth 2 (two) times daily.   CARAFATE 1 GM/10ML suspension Generic drug:  sucralfate Take 10 mLs by mouth 4 (four) times daily.   cyclobenzaprine 5 MG tablet Commonly known as:  FLEXERIL Take 5 mg by mouth 3 (three) times daily as needed for muscle spasms.   diclofenac sodium 1 % Gel Commonly known as:  VOLTAREN Apply 2 g topically 4 (four) times daily. Apply to right shoulder. What changed:  when to take this  reasons to take this  additional instructions   docusate sodium 100 MG capsule Commonly known as:  COLACE Take 1 capsule (100 mg total) by mouth 2 (two) times daily as needed.   HYDROcodone-acetaminophen 5-325 MG tablet Commonly known as:  NORCO/VICODIN Take 1 tablet by mouth every 6 (six) hours as needed for severe pain.   lovastatin 20 MG tablet Commonly known as:  MEVACOR Take 20 mg by mouth daily.   ondansetron 4 MG tablet Commonly known as:  ZOFRAN Take 4 mg by mouth 3 (three) times daily as  needed for nausea or vomiting.   pantoprazole 40 MG tablet Commonly known as:  PROTONIX Take 1 tablet (40 mg total) by mouth 2 (two) times daily. What changed:  when to take this   promethazine 25 MG tablet Commonly known as:  PHENERGAN Take 25 mg by mouth every 6 (six) hours.   senna 8.6 MG Tabs tablet Commonly known as:  SENOKOT Take 1 tablet (8.6 mg total) by mouth daily as needed for mild constipation.   sodium phosphate 7-19 GM/118ML Enem Place 133 mLs (1 enema total) rectally daily as needed for severe constipation.   traMADol 50 MG tablet Commonly known as:  ULTRAM Take 50 mg by mouth 3 (three) times daily.   traZODone 50 MG tablet Commonly known as:  DESYREL Take 50 mg by mouth at bedtime.   venlafaxine XR 75 MG 24 hr  capsule Commonly known as:  EFFEXOR-XR Take 75 mg by mouth daily with breakfast.           10/16/2016 1st [facility] office visit/ Anni Hocevar   Chief Complaint  Patient presents with  . Pulmonary Consult    Referred by hospitalist for eval of abnormal ct chest. She c/o cough with dark sputum, pain in chest when coughing and SOB for at least the past month. She also c/o back pain.    vicodin 5 takes it down to 7 form a 10 only = severe, low back pain s radicular features   Doe = MMRC3 = can't walk 100 yards even at a slow pace at a flat grade s stopping due to sob   Cough brown mucus x just am of ov assoc with gen chest discomfort during coughing   No obvious day to day or daytime variability or assoc   mucus plugs or hemoptysis or cp or chest tightness, subjective wheeze or overt sinus or hb symptoms. No unusual exp hx or h/o childhood pna/ asthma or knowledge of premature birth.  Sleeping ok without nocturnal  or early am exacerbation  of respiratory  c/o's or need for noct saba. Also denies any obvious fluctuation of symptoms with weather or environmental changes or other aggravating or alleviating factors except as outlined above   Current Medications, Allergies, Complete Past Medical History, Past Surgical History, Family History, and Social History were reviewed in [employer] record.  ROS  The following are not active complaints unless bolded sore throat, dysphagia, dental problems, itching, sneezing,  nasal congestion or excess/ purulent secretions, ear ache,   fever, chills, sweats, unintended wt loss, classically pleuritic or exertional cp,  orthopnea pnd or leg swelling, presyncope, palpitations, abdominal pain, anorexia, nausea, vomiting, diarrhea  or change in bowel or bladder habits, change in stools or urine, dysuria,hematuria,  rash, arthralgias, visual complaints, headache, numbness, weakness or ataxia or problems with walking or coordination,   change in mood/affect or memory.               Review of Systems     Objective:   Physical Exam amb bf nad  Wt Readings from Last 3 Encounters:  10/16/16 140 lb (63.5 kg)  10/08/16 136 lb 11 oz (62 kg)  10/04/16 148 lb (67.1 kg)    Vital signs reviewed  - Note on arrival 02 sats  99% on RA     HEENT: nl   turbinates bilaterally, and oropharynx. Nl external ear canals without cough reflex - edentulous/ dentures in place   NECK :  without JVD/Nodes/ nl carotid upstrokes  bilaterally - large marble sized mobile node R paratracheal    LUNGS: no acc muscle use,  Nl contour chest with a few insp and exp rhonchi R > L    CV:  RRR  no s3 or murmur or increase in P2, and no edema   ABD:  soft and nontender with nl inspiratory excursion in the supine position. No bruits or organomegaly appreciated, bowel sounds nl  MS:  Nl gait/ ext warm without deformities, calf tenderness, cyanosis or clubbing No obvious joint restrictions   SKIN: warm and dry without lesions    NEURO:  alert, approp, nl sensorium with  no motor or cerebellar deficits apparent.      I personally reviewed images and agree with radiology impression as follows:   Chest CT   10/09/16 Multiple thoracic spine compression fractures with question associated lytic lesions/pathologic fractures at T3 and T8.  10 x 9 mm slightly irregular medial RIGHT lower lobe nodule, could represent a primary or metastatic lesion.  Tiny nonspecific RIGHT upper lobe nodular foci.       Assessment:

## 2016-10-16 NOTE — Patient Instructions (Signed)
zpak  Prednisone 10 mg take  4 each am x 2 days,   2 each am x 2 days,  1 each am x 2 days and stop   Clonidine 0.1  Three times daily and stop amlodipine   For breakthru pain go ahead and take up to 2 norco every 4 hours as needed   Please see patient coordinator before you leave today  to schedule PET ASAP   The key is to stop smoking completely before smoking completely stops you!   I will call to arrange your next study based on PET Scan.

## 2016-10-18 ENCOUNTER — Emergency Department (HOSPITAL_COMMUNITY)
Admission: EM | Admit: 2016-10-18 | Discharge: 2016-10-19 | Disposition: A | Discharge status: Home / Self Care | Payer: Medicare Other | Attending: Emergency Medicine | Admitting: Emergency Medicine

## 2016-10-18 ENCOUNTER — Encounter (HOSPITAL_COMMUNITY): Payer: Self-pay | Admitting: Emergency Medicine

## 2016-10-18 DIAGNOSIS — M545 Low back pain: Secondary | ICD-10-CM | POA: Diagnosis present

## 2016-10-18 DIAGNOSIS — I10 Essential (primary) hypertension: Secondary | ICD-10-CM | POA: Diagnosis not present

## 2016-10-18 DIAGNOSIS — G8929 Other chronic pain: Secondary | ICD-10-CM | POA: Insufficient documentation

## 2016-10-18 DIAGNOSIS — F17210 Nicotine dependence, cigarettes, uncomplicated: Secondary | ICD-10-CM | POA: Diagnosis not present

## 2016-10-18 DIAGNOSIS — Z79899 Other long term (current) drug therapy: Secondary | ICD-10-CM | POA: Insufficient documentation

## 2016-10-18 DIAGNOSIS — J449 Chronic obstructive pulmonary disease, unspecified: Secondary | ICD-10-CM | POA: Diagnosis not present

## 2016-10-18 DIAGNOSIS — E119 Type 2 diabetes mellitus without complications: Secondary | ICD-10-CM | POA: Diagnosis not present

## 2016-10-18 DIAGNOSIS — M5440 Lumbago with sciatica, unspecified side: Secondary | ICD-10-CM | POA: Diagnosis not present

## 2016-10-18 MED ORDER — KETOROLAC TROMETHAMINE 60 MG/2ML IM SOLN
30.0000 mg | Freq: Once | INTRAMUSCULAR | Status: AC
  Administered 2016-10-18: 30 mg via INTRAMUSCULAR
  Filled 2016-10-18: qty 2

## 2016-10-18 MED ORDER — LIDOCAINE 5 % EX PTCH
1.0000 | MEDICATED_PATCH | CUTANEOUS | Status: DC
  Administered 2016-10-18: 1 via TRANSDERMAL
  Filled 2016-10-18: qty 1

## 2016-10-18 NOTE — ED Provider Notes (Signed)
[facility] DEPT Provider Note   CSN: [id number] Arrival date & time: 10/18/16  1936  By signing my name below, I, Emily Cohen, attest that this documentation has been prepared under the direction and in the presence of Clarkson, Jaziyah Gradel, MD. Electronically Signed: Lise Auer, ED Scribe. 10/19/16. 12:13 AM.  History   Chief Complaint Chief Complaint  Patient presents with  . Back Pain   The history is provided by the patient. No language interpreter was used.  Back Pain   This is a chronic problem. The current episode started more than 1 week ago. The problem occurs constantly. The problem has not changed since onset.The pain is associated with no known injury. The pain is present in the sacro-iliac joint. The pain does not radiate. The pain is the same all the time. Pertinent negatives include no chest pain, no fever, no numbness, no weight loss, no headaches, no abdominal pain, no abdominal swelling, no bowel incontinence, no perianal numbness, no bladder incontinence, no dysuria, no pelvic pain, no leg pain, no paresthesias, no paresis, no tingling and no weakness. The treatment provided no relief.   HPI HPI Comments: Emily Cohen is a 66 y.o. female with a history of anxiety, alcohol abuse, back pain, depression, GERD, and HTN, brought in by ambulance, who presents to the Emergency Department complaining of persistent, gradually worsening, acute on chronic back pain that worsened tonight. Pt notes some associated constipation. She reports she has been taken her prescribed Hydrocodone and Colace, with no relief of her pain. Denies dysuria, hematuria, or blood in stool.   Past Medical History:  Diagnosis Date  . Alcohol abuse 2014  . Anxiety   . Arthritis    "maybe in my legs" (10/07/2016)  . Breast cancer, left breast ([place]) <2002  . Chronic back pain   . Depression   . Gastric ulcer 05/2016   caused hematemesis.  GU with VV cauterized at EGD.  using NSAIDs.  biopsy negative  for H Pylori.    Marland Kitchen GERD (gastroesophageal reflux disease)   . Hypertension   . Left humeral fracture 01/2016   did not follow up with ortho as planned  . Pancreatitis 2014   likely due to ETOH  . Pre-diabetes     Patient Active Problem List   Diagnosis Date Noted  . COPD GOLD 0 / still smoking 10/16/2016  . Solitary pulmonary nodule 10/16/2016  . Cigarette smoker 10/16/2016  . Back pain 10/09/2016  . Lumbar disc lesion 10/09/2016  . Bronchitis due to tobacco use ([place]) 10/09/2016  . Encounter for screening for lung cancer 10/09/2016  . Benign neoplasm of ascending colon   . Benign neoplasm of transverse colon   . Diverticulosis of colon without hemorrhage   . Generalized abdominal pain   . Lower esophageal ring   . Rectal bleeding 10/07/2016  . Essential hypertension 10/07/2016  . Diabetes mellitus 10/07/2016  . Alcohol use 10/07/2016  . Arthritis 10/07/2016  . HLD (hyperlipidemia) 10/07/2016  . Symptomatic anemia 10/07/2016  . Pancreatic calcification 10/07/2016  . Constipation 10/07/2016  . Alcohol-induced chronic pancreatitis ([place])   . Left lower quadrant pain   . Chronic gastric ulcer with hemorrhage   . Abnormal CT of the abdomen   . Chest pain 08/24/2016  . Respiratory failure ([place])   . Acute post-hemorrhagic anemia   . Alcohol abuse   . Acute upper GI bleed 05/22/2016    Past Surgical History:  Procedure Laterality Date  . ABDOMINAL HYSTERECTOMY    .  BREAST BIOPSY Left <2002  . COLONOSCOPY N/A 10/08/2016   Procedure: COLONOSCOPY;  Surgeon: Irene Shipper, MD;  Location: [facility] ENDOSCOPY;  Service: Endoscopy;  Laterality: N/A;  . ESOPHAGOGASTRODUODENOSCOPY N/A 05/23/2016   Procedure: ESOPHAGOGASTRODUODENOSCOPY (EGD);  Surgeon: Milus Banister, MD;  Location: [facility];  Service: Endoscopy;  Laterality: N/A;  . ESOPHAGOGASTRODUODENOSCOPY  05/2016   Dr Owens Loffler.  for hematemesis.  cauterized a large GU, gastritis noted.  H pylori negativ on biopsy  .  ESOPHAGOGASTRODUODENOSCOPY N/A 10/08/2016   Procedure: ESOPHAGOGASTRODUODENOSCOPY (EGD);  Surgeon: Irene Shipper, MD;  Location[facility] ENDOSCOPY;  Service: Endoscopy;  Laterality: N/A;  . MASTECTOMY Left <2002   for cancer  . TONSILLECTOMY      OB History    No data available       Home Medications    Prior to Admission medications   Medication Sig Start Date End Date Taking? Authorizing Provider  azithromycin (ZITHROMAX) 250 MG tablet Take 2 on day one then 1 daily x 4 days 10/16/16   Tanda Rockers, MD  bisacodyl (DULCOLAX) 5 MG EC tablet Take 1 tablet (5 mg total) by mouth 2 (two) times daily. 10/01/16   Carmin Muskrat, MD  CARAFATE 1 GM/10ML suspension Take 10 mLs (1 g total) by mouth 4 (four) times daily. 10/09/16 11/08/16  Amin, Jeanella Flattery, MD  cloNIDine (CATAPRES) 0.1 MG tablet Take 1 tablet (0.1 mg total) by mouth 3 (three) times daily. 10/16/16   Tanda Rockers, MD  cyclobenzaprine (FLEXERIL) 5 MG tablet Take 5 mg by mouth 3 (three) times daily as needed for muscle spasms.    [provider]  diclofenac sodium (VOLTAREN) 1 % GEL Apply 2 g topically 4 (four) times daily. Apply to right shoulder. Patient taking differently: Apply 2 g topically 4 (four) times daily as needed (for right shoulder pain).  08/25/16   Arrien, Jimmy Picket, MD  docusate sodium (COLACE) 100 MG capsule Take 1 capsule (100 mg total) by mouth 2 (two) times daily as needed. 10/09/16 10/09/17  Amin, Jeanella Flattery, MD  HYDROcodone-acetaminophen (NORCO/VICODIN) 5-325 MG tablet Take 1-2 tablets by mouth every 4 (four) hours as needed for severe pain. 10/16/16   Tanda Rockers, MD  lovastatin (MEVACOR) 20 MG tablet Take 20 mg by mouth daily.    [provider]  ondansetron (ZOFRAN) 4 MG tablet Take 4 mg by mouth 3 (three) times daily as needed for nausea or vomiting.    [provider]  pantoprazole (PROTONIX) 40 MG tablet Take 1 tablet (40 mg total) by mouth 2 (two) times daily. 10/09/16   Amin,  Jeanella Flattery, MD  predniSONE (DELTASONE) 10 MG tablet Take  4 each am x 2 days,   2 each am x 2 days,  1 each am x 2 days and stop 10/16/16   Tanda Rockers, MD  promethazine (PHENERGAN) 25 MG tablet Take 25 mg by mouth every 6 (six) hours.    [provider]  senna (SENOKOT) 8.6 MG TABS tablet Take 1 tablet (8.6 mg total) by mouth daily as needed for mild constipation. 10/09/16   Amin, Jeanella Flattery, MD  sodium phosphate (FLEET) 7-19 GM/118ML ENEM Place 133 mLs (1 enema total) rectally daily as needed for severe constipation. 10/01/16   Carmin Muskrat, MD  traZODone (DESYREL) 50 MG tablet Take 50 mg by mouth at bedtime. 10/05/16   [provider]    Family History History reviewed. No pertinent family history.  Social History Social  History  Substance Use Topics  . Smoking status: Current Every Day Smoker    Packs/day: 1.00    Years: 47.00    Types: Cigarettes  . Smokeless tobacco: Never Used  . Alcohol use Yes     Comment: 10/07/2016 "nothing in 7 months; heavy drinker before that"    Allergies   Patient has no known allergies.  Review of Systems Review of Systems  Constitutional: Negative for fever and weight loss.  Cardiovascular: Negative for chest pain.  Gastrointestinal: Positive for constipation. Negative for abdominal pain, blood in stool and bowel incontinence.  Genitourinary: Negative for bladder incontinence, difficulty urinating, dysuria, hematuria and pelvic pain.  Musculoskeletal: Positive for back pain. Negative for gait problem.  Neurological: Negative for tingling, weakness, numbness, headaches and paresthesias.  All other systems reviewed and are negative.  Physical Exam Updated Vital Signs BP 134/71 (BP Location: Right Arm)   Pulse 82   Temp 98.5 F (36.9 C) (Oral)   Resp 18   Ht 5\' 7"  (1.702 m)   Wt 140 lb (63.5 kg)   SpO2 97%   BMI 21.93 kg/m   Physical Exam  Constitutional: She is oriented to person, place, and time. She appears  well-developed and well-nourished. No distress.  HENT:  Head: Normocephalic and atraumatic.  Mouth/Throat: Oropharynx is clear and moist. No oropharyngeal exudate.  Eyes: Conjunctivae and EOM are normal. Pupils are equal, round, and reactive to light. Right eye exhibits no discharge. Left eye exhibits no discharge. No scleral icterus.  Neck: Normal range of motion. Neck supple. No JVD present. No tracheal deviation present.  Trachea is midline. No stridor or carotid bruits.   Cardiovascular: Normal rate, regular rhythm, normal heart sounds and intact distal pulses.   No murmur heard. Pulmonary/Chest: Effort normal and breath sounds normal. No stridor. No respiratory distress. She has no wheezes. She has no rales.  Lungs CTA bilaterally.  Abdominal: Soft. She exhibits no distension. There is no tenderness. There is no rebound and no guarding.  Pt is constipated.  Musculoskeletal: Normal range of motion. She exhibits no edema or tenderness.  All compartments are soft. No palpable cords.   Lymphadenopathy:    She has no cervical adenopathy.  Neurological: She is alert and oriented to person, place, and time. She has normal reflexes. She displays normal reflexes. She exhibits normal muscle tone.  Skin: Skin is warm and dry. Capillary refill takes less than 2 seconds.  Psychiatric: She has a normal mood and affect. Her behavior is normal.  Nursing note and vitals reviewed.  ED Treatments / Results  DIAGNOSTIC STUDIES: Oxygen Saturation is 97% on RA, normal by my interpretation.   COORDINATION OF CARE: 11:58 PM-Discussed next steps with pt. Pt verbalized understanding and is agreeable with the plan.   Radiology No results found.  Procedures Procedures (including critical care time)  Medications Ordered in ED  Medications  lidocaine (LIDODERM) 5 % 1 patch (1 patch Transdermal Patch Applied 10/18/16 2350)  ketorolac (TORADOL) injection 30 mg (30 mg Intramuscular Given 10/18/16 2351)      Final Clinical Impressions(s) / ED Diagnoses  Chronic pain, no change over baseline.  No trauma.  Has multiple outstanding opioid scripts per the [place]. Return for shortness of breath, worsening pain, chest pain with exertion, shortness of breath with exertion,fevers, altered level of consciousness,bleeding or any concerns.Follow up with your own doctor for recheck.  The patient is nontoxic-appearing on exam and vital signs are within normal limits.   I have  reviewed the triage vital signs and the nursing notes. Pertinent labs &imaging results that were available during my care of the patient were reviewed by me and considered in my medical decision making (see chart for details).  After history, exam, and medical workup I feel the patient has been appropriately medically screened and is safe for discharge home. Pertinent diagnoses were discussed with the patient. Patient was given return precautions.    I personally performed the services described in this documentation, which was scribed in my presence. The recorded information has been reviewed and is accurate.    Kahdijah Errickson, MD 10/19/16 [phone]

## 2016-10-18 NOTE — ED Triage Notes (Signed)
Patient is complaining of chronic back pain. Patient is on pain management and states that the home pain medication is not working. Patient has a procedure on July 20 but states she could not wait till then.

## 2016-10-19 ENCOUNTER — Encounter (HOSPITAL_COMMUNITY): Payer: Self-pay | Admitting: Emergency Medicine

## 2016-10-19 LAB — URINALYSIS, ROUTINE W REFLEX MICROSCOPIC
BILIRUBIN URINE: NEGATIVE
Glucose, UA: NEGATIVE mg/dL
Hgb urine dipstick: NEGATIVE
KETONES UR: NEGATIVE mg/dL
Leukocytes, UA: NEGATIVE
NITRITE: NEGATIVE
PROTEIN: NEGATIVE mg/dL
SPECIFIC GRAVITY, URINE: 1.013 (ref 1.005–1.030)
pH: 6 (ref 5.0–8.0)

## 2016-10-19 MED ORDER — LIDOCAINE 5 % EX PTCH: 1.0000 | MEDICATED_PATCH | CUTANEOUS | 0 refills | Status: DC

## 2016-10-20 ENCOUNTER — Telehealth: Payer: Self-pay | Admitting: Internal Medicine

## 2016-10-20 ENCOUNTER — Institutional Professional Consult (permissible substitution): Payer: Medicare Other | Admitting: Internal Medicine

## 2016-10-20 NOTE — Telephone Encounter (Signed)
Since this is not a lung problem can't help her at this point and if can't see PC will need to go to [facility] or ER

## 2016-10-20 NOTE — Telephone Encounter (Signed)
Per MW verbally- pt should contact PCP in regards to sx as this is not a pulmonary issue or recommend going to urgent care.   I have spoken with pt and made her aware of MW's recommendations. Pt voiced her understanding.  Nothing further needed

## 2016-10-20 NOTE — Telephone Encounter (Signed)
Spoke with pt, who states she had an ED visit on 10/18/16 due to back pain. Pt states she was prescribed Lidocaine patches. Pt states her insurance does not cover these patches and it is a 98 dollar co pay. During pt's OV with MW on 10/16/16, pt was prescribed Norco 5mg   #90. Pt reported that she feels that the Norco is causing constipation. Pt states her last bowel  movement was 1 month ago and she is in severe  pain.  I asked pt if she has contacted her PCP in regards to her symptoms. Pt states her PCP suggested she be seen, pt states she is in so much pain that she can not go in.    MR please advise. Thanks.   10/16/16 zpak  Prednisone 10 mg take  4 each am x 2 days,   2 each am x 2 days,  1 each am x 2 days and stop   Clonidine 0.1  Three times daily and stop amlodipine   For breakthru pain go ahead and take up to 2 norco every 4 hours as needed   Please see patient coordinator before you leave today  to schedule PET ASAP   The key is to stop smoking completely before smoking completely stops you!   I will call to arrange your next study based on PET Scan.

## 2016-10-21 ENCOUNTER — Telehealth: Payer: Self-pay | Admitting: Internal Medicine

## 2016-10-21 NOTE — Telephone Encounter (Signed)
Spoke with pt and her husband. She states she is constipated and she doesn't have any money for any medication. Pt has appt scheduled. Discussed with her that she can get Magn[facility] Citrate [facility] for constipation. Pt encouraged to keep her OV as scheduled.

## 2016-10-29 ENCOUNTER — Encounter: Payer: Self-pay | Admitting: Physician Assistant

## 2016-10-29 ENCOUNTER — Ambulatory Visit (INDEPENDENT_AMBULATORY_CARE_PROVIDER_SITE_OTHER): Payer: Medicare Other | Admitting: Physician Assistant

## 2016-10-29 VITALS — BP 128/60 | HR 120 | Ht 65.0 in | Wt 135.0 lb

## 2016-10-29 DIAGNOSIS — R1084 Generalized abdominal pain: Secondary | ICD-10-CM

## 2016-10-29 DIAGNOSIS — K219 Gastro-esophageal reflux disease without esophagitis: Secondary | ICD-10-CM

## 2016-10-29 DIAGNOSIS — K5900 Constipation, unspecified: Secondary | ICD-10-CM | POA: Diagnosis not present

## 2016-10-29 NOTE — Progress Notes (Signed)
I agree with the above note, plan 

## 2016-10-29 NOTE — Progress Notes (Signed)
Chief Complaint: Constipation  HPI:  Emily Cohen is a 66 year old African-American female with past medical history of multiple medical problems including chronic back pain and multiple others listed below, who was referred to me by Benito Mccreedy, MD for a complaint of constipation .     Patient was initially consulted by [place] in the hospital in February by Dr. Ardis Hughs, she had EGD at that time which showed gastric ulcer. She has been in the hospital since then and had EGD and colonoscopy with Dr. Henrene Pastor. Colonoscopy performed 10/08/16 showed 5 polyps in the transverse colon, hepatic flexure and ascending colon, diverticulosis in the left colon, internal hemorrhoids which is thought to be the source of her rectal bleeding and otherwise normal exam. Patient had EGD that day as well which showed a previously healed ulcer and incidental distal esophageal ring and was otherwise normal.    Today, the patient presents to clinic accompanied by her husband, who does assist with her history. In fact, her husband is quite serious and concerned regarding the "7 trips via ambulance we have taken in the past month and a half for her pain and symptoms". Apparently, the patient is currently on methadone to try and stop her pain medicine which "wasn't working anyways". Prior to this she had been on chronic narcotics and opioids in order to help with back pain.     Currently, she is battling with chronic constipation. She tells me that she strains on a daily basis to have a small hard stool only occasionally. They have apparently tried many laxatives in the past including Senokot, MiraLAX, prune juice, mag citrate and "others that were prescribed". None of these have seemed to work on a daily basis. Patient does tell me that when she "cleaned out for my recent colonoscopy", she did feel some relief of her back pain, though this was not completely gone. Patient does tell me that she is not walking as much as normal and in  fact is using a walker to ambulate due to her back pain recently, she was also on trazodone for a period of time along with her narcotics. She is no longer taking this medicine but does continue with Robaxin twice daily.   Patient also describes some reflux which only occurs 1-2 times a week. She is using her Pantoprazole 40 mg once daily. She denies any epigastric pain. She does use Phenergan occasionally for nausea.   Patient denies fever, chills, blood in her stool, melena, weight loss, anorexia, nausea, vomiting, dysphagia or symptoms that awaken her at night.  Past Medical History:  Diagnosis Date  . Alcohol abuse 2014  . Anxiety   . Arthritis    "maybe in my legs" (10/07/2016)  . Breast cancer, left breast ([place]) <2002  . Chronic back pain   . Depression   . Gastric ulcer 05/2016   caused hematemesis.  GU with VV cauterized at EGD.  using NSAIDs.  biopsy negative for H Pylori.    Marland Kitchen GERD (gastroesophageal reflux disease)   . Hypertension   . Left humeral fracture 01/2016   did not follow up with ortho as planned  . Pancreatitis 2014   likely due to ETOH  . Pre-diabetes     Past Surgical History:  Procedure Laterality Date  . ABDOMINAL HYSTERECTOMY    . BREAST BIOPSY Left <2002  . COLONOSCOPY N/A 10/08/2016   Procedure: COLONOSCOPY;  Surgeon: Irene Shipper, MD;  Location: [facility] ENDOSCOPY;  Service: Endoscopy;  Laterality: N/A;  .  ESOPHAGOGASTRODUODENOSCOPY N/A 05/23/2016   Procedure: ESOPHAGOGASTRODUODENOSCOPY (EGD);  Surgeon: Milus Banister, MD;  Location: [facility];  Service: Endoscopy;  Laterality: N/A;  . ESOPHAGOGASTRODUODENOSCOPY  05/2016   Dr Owens Loffler.  for hematemesis.  cauterized a large GU, gastritis noted.  H pylori negativ on biopsy  . ESOPHAGOGASTRODUODENOSCOPY N/A 10/08/2016   Procedure: ESOPHAGOGASTRODUODENOSCOPY (EGD);  Surgeon: Irene Shipper, MD;  Location: [facility] ENDOSCOPY;  Service: Endoscopy;  Laterality: N/A;  . MASTECTOMY Left <2002   for cancer  .  TONSILLECTOMY      Current Outpatient Prescriptions  Medication Sig Dispense Refill  . amLODipine (NORVASC) 10 MG tablet once.  0  . cloNIDine (CATAPRES) 0.1 MG tablet Take 1 tablet (0.1 mg total) by mouth 3 (three) times daily. 90 tablet 11  . diclofenac sodium (VOLTAREN) 1 % GEL Apply 2 g topically 4 (four) times daily. Apply to right shoulder. (Patient taking differently: Apply 2 g topically 4 (four) times daily as needed (for right shoulder pain). ) 1 Tube 0  . docusate sodium (COLACE) 100 MG capsule Take 1 capsule (100 mg total) by mouth 2 (two) times daily as needed. 30 capsule 1  . methocarbamol (ROBAXIN) 500 MG tablet Take 500 mg by mouth 2 (two) times daily.    . pantoprazole (PROTONIX) 40 MG tablet Take 1 tablet (40 mg total) by mouth 2 (two) times daily. (Patient taking differently: Take 40 mg by mouth once. ) 60 tablet 0  . promethazine (PHENERGAN) 25 MG tablet Take 25 mg by mouth every 6 (six) hours.    . senna (SENOKOT) 8.6 MG TABS tablet Take 1 tablet (8.6 mg total) by mouth daily as needed for mild constipation. 120 each 0  . venlafaxine XR (EFFEXOR-XR) 75 MG 24 hr capsule Take 75 mg by mouth daily.  0  . bisacodyl (DULCOLAX) 5 MG EC tablet Take 1 tablet (5 mg total) by mouth 2 (two) times daily. (Patient not taking: Reported on 10/29/2016) 14 tablet 0  . CARAFATE 1 GM/10ML suspension Take 10 mLs (1 g total) by mouth 4 (four) times daily. (Patient not taking: Reported on 10/29/2016) 420 mL 1  . HYDROcodone-acetaminophen (NORCO/VICODIN) 5-325 MG tablet Take 1-2 tablets by mouth every 4 (four) hours as needed for severe pain. (Patient not taking: Reported on 10/29/2016) 90 tablet 0  . ondansetron (ZOFRAN) 4 MG tablet Take 4 mg by mouth 3 (three) times daily as needed for nausea or vomiting.    . traZODone (DESYREL) 50 MG tablet Take 50 mg by mouth at bedtime.  0   No current facility-administered medications for this visit.     Allergies as of 10/29/2016  . (No Known Allergies)     Family History  Problem Relation Age of Onset  . Hypertension Mother   . Diabetes Mother   . Diabetes Father   . Colon cancer Neg Hx   . Rectal cancer Neg Hx   . Esophageal cancer Neg Hx     Social History   Social History  . Marital status: Legally Separated    Spouse name: N/A  . Number of children: 0  . Years of education: N/A   Occupational History  . disabled    Social History Main Topics  . Smoking status: Current Every Day Smoker    Packs/day: 1.00    Years: 47.00    Types: Cigarettes  . Smokeless tobacco: Never Used  . Alcohol use Yes     Comment: 10/07/2016 "nothing in 7 months; heavy drinker  before that"  . Drug use: No  . Sexual activity: Not Currently   Other Topics Concern  . Not on file   Social History Narrative  . No narrative on file    Review of Systems:    Constitutional: No weight loss, fever or chills Cardiovascular: No chest pain  Respiratory: No SOB  Gastrointestinal: See HPI and otherwise negative  Physical Exam:  Vital signs: BP 128/60   Pulse (!) 120   Ht 5\' 5"  (1.651 m)   Wt 135 lb (61.2 kg)   BMI 22.47 kg/m   Constitutional:   African American female appears to be in mild distress, Well developed, Well nourished, alert and cooperative Head:  Normocephalic and atraumatic. Eyes:   PEERL, EOMI. No icterus. Conjunctiva pink. Ears:  Normal auditory acuity. Neck:  Supple Throat: Oral cavity and pharynx without inflammation, swelling or lesion.  Respiratory: Respirations even and unlabored. Lungs clear to auscultation bilaterally.   No wheezes, crackles, or rhonchi.  Cardiovascular: Normal S1, S2. No MRG. Regular rate and rhythm. No peripheral edema, cyanosis or pallor.  Gastrointestinal:  Soft,mild distension, generalized mild ttp No rebound or guarding. Normal bowel sounds. No appreciable masses or hepatomegaly. Rectal:  Not performed.  Msk:  Symmetrical without gross deformities. Without edema, no deformity or joint  abnormality. Uses walker to ambulate Neurologic:  Alert and  oriented x4;  grossly normal neurologically.  Skin:   Dry and intact without significant lesions or rashes. Psychiatric: Demonstrates good judgement and reason without abnormal affect or behaviors.  MOST RECENT LABS AND IMAGING: CBC    Component Value Date/Time   WBC 10.9 (H) 10/08/2016 0352   RBC 4.69 10/08/2016 0352   HGB 11.5 (L) 10/08/2016 0352   HCT 34.6 (L) 10/08/2016 0352   PLT 447 (H) 10/08/2016 0352   MCV 73.8 (L) 10/08/2016 0352   MCH 24.5 (L) 10/08/2016 0352   MCHC 33.2 10/08/2016 0352   RDW 19.4 (H) 10/08/2016 0352   LYMPHSABS 3.2 10/04/2016 1456   MONOABS 0.7 10/04/2016 1456   EOSABS 0.1 10/04/2016 1456   BASOSABS 0.0 10/04/2016 1456    CMP     Component Value Date/Time   NA 142 10/08/2016 1529   K 3.4 (L) 10/08/2016 1529   CL 113 (H) 10/08/2016 1529   CO2 22 10/08/2016 1529   GLUCOSE 92 10/08/2016 1529   BUN 5 (L) 10/08/2016 1529   CREATININE 0.57 10/08/2016 1529   CALCIUM 9.0 10/08/2016 1529   PROT 6.4 (L) 10/08/2016 0352   ALBUMIN 3.3 (L) 10/08/2016 0352   AST 12 (L) 10/08/2016 0352   ALT 13 (L) 10/08/2016 0352   ALKPHOS 126 10/08/2016 0352   BILITOT 0.9 10/08/2016 0352   GFRNONAA >60 10/08/2016 1529   GFRAA >60 10/08/2016 1529    Assessment: 1. Constipation: Likely this is multifactorial, the patient is on multiple medications which could be contributing including recent opioids, trazodone, Robaxin and Phenergan, discussed this in detail with the patient and her husband, recent colonoscopy with no structural abnormalities that could be contributing 2. GERD: Moderately controlled on Pantoprazole 40mg  qd 3. Generalized abdominal pain: likely due to constipation  Plan: 1. Most of today's appointment was spent discussing the patient's care with the patient and her husband. I believe she is on the right track with trying to stop opioid medications which are likely the primary contributor to her  constipation, which is likely making her back pain worse. From a GI standpoint, if we can get her constipation under control  we can at least help with some of the back pain that she is experiencing currently, but I do not expect her to recover fully from this chronic back pain once we resolve her constipation problem. Also spent time discussing that we will just have to trial some different medications to see if they are working. Apparently this has bothered patient's husband in the past because everybody just "puts her on medicine". We will start Linzess 290 mcg once daily, for which they were provided samples. Patient was also given a prescription in case this is helping. If this does not help we can try Movantik or others in the future. 2. Provided the patient with a bowel prep today, recommended that she go home and clean out her bowel to help the Linzess start working. 3. Recommend the patient increase water intake to the 6-8 8 ounce glasses of water per day. 4. Patient and her husband should continue to follow with previous physicians and referrals 5. Continue Pantoprazole 40mg  qd 6. Patient to follow in clinic with Dr. Ardis Hughs in the future  Ellouise Newer, PA-C [facility] 10/29/2016, 12:28 PM  Cc: Benito Mccreedy, MD

## 2016-10-29 NOTE — Patient Instructions (Addendum)
We have given you samples Linzess 290 mcg daily.   Continue Pantoprazole 40 mg daily 30-60 mins before breakfast.   Please use the given bowel prep use one bottle as directed and if you do not get the desired results then use the other bottle.

## 2016-10-30 ENCOUNTER — Telehealth: Payer: Self-pay | Admitting: Physician Assistant

## 2016-10-30 ENCOUNTER — Ambulatory Visit (HOSPITAL_COMMUNITY)
Admission: RE | Admit: 2016-10-30 | Discharge: 2016-10-30 | Disposition: A | Discharge status: Home / Self Care | Payer: Medicare Other | Source: Ambulatory Visit | Attending: Internal Medicine | Admitting: Internal Medicine

## 2016-10-30 DIAGNOSIS — I2699 Other pulmonary embolism without acute cor pulmonale: Secondary | ICD-10-CM | POA: Diagnosis not present

## 2016-10-30 DIAGNOSIS — R911 Solitary pulmonary nodule: Secondary | ICD-10-CM | POA: Insufficient documentation

## 2016-10-30 DIAGNOSIS — C7951 Secondary malignant neoplasm of bone: Secondary | ICD-10-CM | POA: Insufficient documentation

## 2016-10-30 DIAGNOSIS — E279 Disorder of adrenal gland, unspecified: Secondary | ICD-10-CM

## 2016-10-30 DIAGNOSIS — I700 Atherosclerosis of aorta: Secondary | ICD-10-CM | POA: Insufficient documentation

## 2016-10-30 DIAGNOSIS — M84422A Pathological fracture, left humerus, initial encounter for fracture: Secondary | ICD-10-CM | POA: Insufficient documentation

## 2016-10-30 DIAGNOSIS — M8448XA Pathological fracture, other site, initial encounter for fracture: Secondary | ICD-10-CM

## 2016-10-30 DIAGNOSIS — C801 Malignant (primary) neoplasm, unspecified: Secondary | ICD-10-CM | POA: Insufficient documentation

## 2016-10-30 DIAGNOSIS — K8689 Other specified diseases of pancreas: Secondary | ICD-10-CM

## 2016-10-30 DIAGNOSIS — R918 Other nonspecific abnormal finding of lung field: Secondary | ICD-10-CM | POA: Diagnosis not present

## 2016-10-30 LAB — GLUCOSE, CAPILLARY: Glucose-Capillary: 119 mg/dL — ABNORMAL HIGH (ref 65–99)

## 2016-10-30 MED ORDER — FLUDEOXYGLUCOSE F - 18 (FDG) INJECTION
6.4000 | Freq: Once | INTRAVENOUS | Status: AC | PRN
  Administered 2016-10-30: 6.4 via INTRAVENOUS

## 2016-10-30 NOTE — Telephone Encounter (Signed)
Left message on machine to call back  

## 2016-11-01 ENCOUNTER — Other Ambulatory Visit: Payer: Self-pay | Admitting: Gastroenterology

## 2016-11-01 ENCOUNTER — Telehealth: Payer: Self-pay | Admitting: Gastroenterology

## 2016-11-01 DIAGNOSIS — K5900 Constipation, unspecified: Secondary | ICD-10-CM

## 2016-11-01 MED ORDER — POLYETHYLENE GLYCOL 3350 17 GM/SCOOP PO POWD: ORAL | 3 refills | Status: AC

## 2016-11-01 NOTE — Telephone Encounter (Addendum)
Patient's husband called back - reports the Miralax I called in cost $4 and they can't afford it. He yelled for several minutes. He proceeded to berate me for this and was wanting it to be free. He states he can get narcotics for free but not free laxatives. I said I would try to get them free samples of Miralax tomorrow if possible, but I can't give them free medications otherwise. He said that was unacceptable and that he needed it now. He proceeded to say that I should "do the right thing now, to prevent a big problem later". He further threatened that he would get "guys in suits" to make "[place] pay". He states "the system took of his money and now we are going to pay him back". He asked me to "read between the lines", and then called me a moron.   At that point in time I told the patient's husband I was ending the phone call and he was being threatening and disrespectful. If the patient needs further care she will have to go to the ER this weekend.  Corlis Hove, not sure if you wish to keep this patient in our practice or dismiss - she was not the one causing any issues, but her husband is threatening / verbally abusive   Of note, Alonza Bogus was present for conversations as a witness

## 2016-11-01 NOTE — Telephone Encounter (Signed)
Patient and her husband called about worsening constipation today (Sunday).  When I called back, before I could even introduce myself, patient's husband was irate, yelling over the phone about how "these doctors don't know anything" and that "the hospital threw her out on the street". Once I was able to speak with him about what was going on, it sounds like she has severe constipation and associated abdominal pain. She is eating, not vomiting. She was given Linzess by PA Lemmon on 7/19, thought to have constipation, also in part due to narcotics. Husband saw a commercial on TV today that states Linzess should not be given in the setting of a bowel obstruction, and was very angry she received this. I discussed with him that I don't think she has had a known bowel obstruction recently, she appears to be tolerating PO, but more likely has severe constipation, although fecal impaction is possible. She's had 2 prior imaging studies on 6/21 and 6/27 which did not show any evidence of a bowel obstruction. She had a colonoscopy on 6/28 which did not show any retained stool in the colon. EGD at that time was normal.  This conversation was very long, most of the time I let the patient's husband vent / yell his frustrations about the health care system and the hospital. I asked the patient multiple times to lower his tone. He stated "I am bipolar and a very angry man". Once I was able to eventually get the patient on the phone, she states she passed a small stool this morning. She has been using enemas recently without much success. Linzess hasn't seemed to help. Symptoms ongoing she states for 5 weeks. I discussed symptoms of fecal impaction, and if she is in that much discomfort she needs to go to the hospital to be evaluated for it, but she declined. I discussed options with her. I think she should try another enema today. Longer term would consider Movantik if she takes narcotics, but don't think she can afford it -  may need to try to get some samples. Otherwise she thinks Miralax has helped in the past. Recommend high dose miralax - take a few doses now and then at least BID, double dose at a time. If she is intolerant to PO, worsening pain, not passing stools, etc, concern for impaction she will have to go the hospital - she verbalized understanding. Husband got back on the phone to yell again at the end of the call about the hospital system and then asked for a prescription for miralax at the pharmacy. He states he wants to be seen by our office tomorrow. I told them our office would contact them but I cannot guarantee an appointment, they will have to look at the schedule.   Dorita Sciara, [place]

## 2016-11-01 NOTE — Telephone Encounter (Deleted)
To clarify, the patient's husband was the one being disrespectful and threatening on the phone, not the patient

## 2016-11-02 ENCOUNTER — Emergency Department (HOSPITAL_COMMUNITY): Payer: Medicare Other

## 2016-11-02 ENCOUNTER — Other Ambulatory Visit: Payer: Self-pay | Admitting: Internal Medicine

## 2016-11-02 ENCOUNTER — Encounter (HOSPITAL_COMMUNITY): Payer: Self-pay | Admitting: Emergency Medicine

## 2016-11-02 ENCOUNTER — Telehealth: Payer: Self-pay | Admitting: Internal Medicine

## 2016-11-02 ENCOUNTER — Inpatient Hospital Stay (HOSPITAL_COMMUNITY)
Admission: EM | Admit: 2016-11-02 | Discharge: 2016-11-07 | DRG: 988 | Disposition: A | Discharge status: Home / Self Care | Payer: Medicare Other | Attending: Internal Medicine | Admitting: Internal Medicine

## 2016-11-02 ENCOUNTER — Other Ambulatory Visit: Payer: Self-pay

## 2016-11-02 DIAGNOSIS — C3490 Malignant neoplasm of unspecified part of unspecified bronchus or lung: Secondary | ICD-10-CM | POA: Diagnosis present

## 2016-11-02 DIAGNOSIS — G893 Neoplasm related pain (acute) (chronic): Secondary | ICD-10-CM | POA: Diagnosis present

## 2016-11-02 DIAGNOSIS — C7951 Secondary malignant neoplasm of bone: Secondary | ICD-10-CM | POA: Diagnosis present

## 2016-11-02 DIAGNOSIS — M4854XA Collapsed vertebra, not elsewhere classified, thoracic region, initial encounter for fracture: Secondary | ICD-10-CM | POA: Diagnosis present

## 2016-11-02 DIAGNOSIS — E876 Hypokalemia: Secondary | ICD-10-CM | POA: Diagnosis present

## 2016-11-02 DIAGNOSIS — Z8711 Personal history of peptic ulcer disease: Secondary | ICD-10-CM

## 2016-11-02 DIAGNOSIS — Z803 Family history of malignant neoplasm of breast: Secondary | ICD-10-CM

## 2016-11-02 DIAGNOSIS — Z90710 Acquired absence of both cervix and uterus: Secondary | ICD-10-CM | POA: Diagnosis not present

## 2016-11-02 DIAGNOSIS — Z8719 Personal history of other diseases of the digestive system: Secondary | ICD-10-CM

## 2016-11-02 DIAGNOSIS — M549 Dorsalgia, unspecified: Secondary | ICD-10-CM | POA: Diagnosis present

## 2016-11-02 DIAGNOSIS — R519 Headache, unspecified: Secondary | ICD-10-CM

## 2016-11-02 DIAGNOSIS — D638 Anemia in other chronic diseases classified elsewhere: Secondary | ICD-10-CM | POA: Diagnosis present

## 2016-11-02 DIAGNOSIS — F17210 Nicotine dependence, cigarettes, uncomplicated: Secondary | ICD-10-CM | POA: Diagnosis present

## 2016-11-02 DIAGNOSIS — M8448XA Pathological fracture, other site, initial encounter for fracture: Secondary | ICD-10-CM | POA: Diagnosis not present

## 2016-11-02 DIAGNOSIS — Z9012 Acquired absence of left breast and nipple: Secondary | ICD-10-CM

## 2016-11-02 DIAGNOSIS — C799 Secondary malignant neoplasm of unspecified site: Secondary | ICD-10-CM | POA: Diagnosis not present

## 2016-11-02 DIAGNOSIS — J449 Chronic obstructive pulmonary disease, unspecified: Secondary | ICD-10-CM | POA: Diagnosis present

## 2016-11-02 DIAGNOSIS — R911 Solitary pulmonary nodule: Secondary | ICD-10-CM

## 2016-11-02 DIAGNOSIS — I10 Essential (primary) hypertension: Secondary | ICD-10-CM | POA: Diagnosis present

## 2016-11-02 DIAGNOSIS — R918 Other nonspecific abnormal finding of lung field: Secondary | ICD-10-CM

## 2016-11-02 DIAGNOSIS — Z8249 Family history of ischemic heart disease and other diseases of the circulatory system: Secondary | ICD-10-CM

## 2016-11-02 DIAGNOSIS — M84422A Pathological fracture, left humerus, initial encounter for fracture: Secondary | ICD-10-CM | POA: Diagnosis present

## 2016-11-02 DIAGNOSIS — C773 Secondary and unspecified malignant neoplasm of axilla and upper limb lymph nodes: Secondary | ICD-10-CM | POA: Diagnosis not present

## 2016-11-02 DIAGNOSIS — D509 Iron deficiency anemia, unspecified: Secondary | ICD-10-CM | POA: Diagnosis present

## 2016-11-02 DIAGNOSIS — Z79899 Other long term (current) drug therapy: Secondary | ICD-10-CM | POA: Diagnosis not present

## 2016-11-02 DIAGNOSIS — K219 Gastro-esophageal reflux disease without esophagitis: Secondary | ICD-10-CM | POA: Diagnosis present

## 2016-11-02 DIAGNOSIS — E440 Moderate protein-calorie malnutrition: Secondary | ICD-10-CM | POA: Diagnosis present

## 2016-11-02 DIAGNOSIS — C7952 Secondary malignant neoplasm of bone marrow: Secondary | ICD-10-CM

## 2016-11-02 DIAGNOSIS — G8929 Other chronic pain: Secondary | ICD-10-CM | POA: Diagnosis present

## 2016-11-02 DIAGNOSIS — D72829 Elevated white blood cell count, unspecified: Secondary | ICD-10-CM | POA: Diagnosis present

## 2016-11-02 DIAGNOSIS — Z833 Family history of diabetes mellitus: Secondary | ICD-10-CM | POA: Diagnosis not present

## 2016-11-02 DIAGNOSIS — R222 Localized swelling, mass and lump, trunk: Secondary | ICD-10-CM | POA: Diagnosis not present

## 2016-11-02 DIAGNOSIS — C7971 Secondary malignant neoplasm of right adrenal gland: Secondary | ICD-10-CM | POA: Diagnosis present

## 2016-11-02 DIAGNOSIS — F1010 Alcohol abuse, uncomplicated: Secondary | ICD-10-CM | POA: Diagnosis present

## 2016-11-02 DIAGNOSIS — C50919 Malignant neoplasm of unspecified site of unspecified female breast: Secondary | ICD-10-CM | POA: Diagnosis present

## 2016-11-02 DIAGNOSIS — Z9889 Other specified postprocedural states: Secondary | ICD-10-CM | POA: Diagnosis not present

## 2016-11-02 DIAGNOSIS — I2699 Other pulmonary embolism without acute cor pulmonale: Principal | ICD-10-CM | POA: Diagnosis present

## 2016-11-02 DIAGNOSIS — R51 Headache: Secondary | ICD-10-CM

## 2016-11-02 LAB — COMPREHENSIVE METABOLIC PANEL
ALT: 12 U/L — AB (ref 14–54)
AST: 16 U/L (ref 15–41)
Albumin: 4.1 g/dL (ref 3.5–5.0)
Alkaline Phosphatase: 143 U/L — ABNORMAL HIGH (ref 38–126)
Anion gap: 14 (ref 5–15)
BUN: 7 mg/dL (ref 6–20)
CHLORIDE: 99 mmol/L — AB (ref 101–111)
CO2: 25 mmol/L (ref 22–32)
CREATININE: 0.61 mg/dL (ref 0.44–1.00)
Calcium: 10.1 mg/dL (ref 8.9–10.3)
GFR calc Af Amer: >60 mL/min (ref >60)
GFR calc non Af Amer: >60 mL/min (ref >60)
GLUCOSE: 112 mg/dL — AB (ref 65–99)
Potassium: 3 mmol/L — ABNORMAL LOW (ref 3.5–5.1)
SODIUM: 138 mmol/L (ref 135–145)
Total Bilirubin: 1.2 mg/dL (ref 0.3–1.2)
Total Protein: 7.6 g/dL (ref 6.5–8.1)

## 2016-11-02 LAB — CBC
HCT: 29.8 % — ABNORMAL LOW (ref 36.0–46.0)
HEMOGLOBIN: 10.1 g/dL — AB (ref 12.0–15.0)
MCH: 25.4 pg — ABNORMAL LOW (ref 26.0–34.0)
MCHC: 33.9 g/dL (ref 30.0–36.0)
MCV: 75.1 fL — ABNORMAL LOW (ref 78.0–100.0)
PLATELETS: 491 10*3/uL — AB (ref 150–400)
RBC: 3.97 MIL/uL (ref 3.87–5.11)
RDW: 21.1 % — ABNORMAL HIGH (ref 11.5–15.5)
WBC: 15.4 10*3/uL — AB (ref 4.0–10.5)

## 2016-11-02 LAB — POCT I-STAT TROPONIN I: Troponin i, poc: 0 ng/mL (ref 0.00–0.08)

## 2016-11-02 LAB — D-DIMER, QUANTITATIVE: D-Dimer, Quant: >20 ug/mL-FEU — ABNORMAL HIGH (ref 0.00–0.50)

## 2016-11-02 MED ORDER — IOPAMIDOL (ISOVUE-370) INJECTION 76%
INTRAVENOUS | Status: AC
  Filled 2016-11-02: qty 100

## 2016-11-02 MED ORDER — TIZANIDINE HCL 4 MG PO TABS
4.0000 mg | ORAL_TABLET | Freq: Three times a day (TID) | ORAL | Status: DC | PRN
  Administered 2016-11-04: 8 mg via ORAL
  Filled 2016-11-02: qty 2

## 2016-11-02 MED ORDER — MORPHINE SULFATE (PF) 2 MG/ML IV SOLN
2.0000 mg | Freq: Once | INTRAVENOUS | Status: AC
  Administered 2016-11-02: 2 mg via INTRAVENOUS
  Filled 2016-11-02: qty 1

## 2016-11-02 MED ORDER — NICOTINE 21 MG/24HR TD PT24
21.0000 mg | MEDICATED_PATCH | Freq: Every day | TRANSDERMAL | Status: DC
  Administered 2016-11-03 – 2016-11-07 (×5): 21 mg via TRANSDERMAL
  Filled 2016-11-02 (×5): qty 1

## 2016-11-02 MED ORDER — HEPARIN (PORCINE) IN NACL 100-0.45 UNIT/ML-% IJ SOLN
1000.0000 [IU]/h | INTRAMUSCULAR | Status: DC
  Administered 2016-11-03: 1000 [IU]/h via INTRAVENOUS
  Filled 2016-11-02: qty 250

## 2016-11-02 MED ORDER — ACETAMINOPHEN 650 MG RE SUPP: 650.0000 mg | Freq: Four times a day (QID) | RECTAL | Status: DC | PRN

## 2016-11-02 MED ORDER — HEPARIN BOLUS VIA INFUSION
4000.0000 [IU] | Freq: Once | INTRAVENOUS | Status: AC
  Administered 2016-11-03: 4000 [IU] via INTRAVENOUS
  Filled 2016-11-02: qty 4000

## 2016-11-02 MED ORDER — SUCRALFATE 1 GM/10ML PO SUSP
1.0000 g | Freq: Three times a day (TID) | ORAL | Status: DC
  Administered 2016-11-03 – 2016-11-07 (×17): 1 g via ORAL
  Filled 2016-11-02 (×17): qty 10

## 2016-11-02 MED ORDER — POLYETHYLENE GLYCOL 3350 17 GM/SCOOP PO POWD
1.0000 | Freq: Every day | ORAL | Status: DC
  Filled 2016-11-02: qty 255

## 2016-11-02 MED ORDER — SODIUM CHLORIDE 0.9 % IV BOLUS (SEPSIS)
1000.0000 mL | Freq: Once | INTRAVENOUS | Status: AC
  Administered 2016-11-02: 1000 mL via INTRAVENOUS

## 2016-11-02 MED ORDER — HEPARIN BOLUS VIA INFUSION
4000.0000 [IU] | Freq: Once | INTRAVENOUS | Status: DC
  Filled 2016-11-02: qty 4000

## 2016-11-02 MED ORDER — MORPHINE SULFATE (PF) 2 MG/ML IV SOLN: 2.0000 mg | INTRAVENOUS | Status: DC | PRN

## 2016-11-02 MED ORDER — KETOROLAC TROMETHAMINE 15 MG/ML IJ SOLN
15.0000 mg | Freq: Once | INTRAMUSCULAR | Status: AC
  Administered 2016-11-02: 15 mg via INTRAVENOUS
  Filled 2016-11-02: qty 1

## 2016-11-02 MED ORDER — HEPARIN (PORCINE) IN NACL 100-0.45 UNIT/ML-% IJ SOLN
1000.0000 [IU]/h | INTRAMUSCULAR | Status: DC
  Filled 2016-11-02: qty 250

## 2016-11-02 MED ORDER — POTASSIUM CHLORIDE CRYS ER 20 MEQ PO TBCR
40.0000 meq | EXTENDED_RELEASE_TABLET | Freq: Once | ORAL | Status: AC
  Administered 2016-11-03: 40 meq via ORAL
  Filled 2016-11-02: qty 2

## 2016-11-02 MED ORDER — MORPHINE SULFATE (PF) 2 MG/ML IV SOLN
6.0000 mg | Freq: Once | INTRAVENOUS | Status: DC
  Filled 2016-11-02: qty 3

## 2016-11-02 MED ORDER — ONDANSETRON HCL 4 MG PO TABS: 4.0000 mg | ORAL_TABLET | Freq: Four times a day (QID) | ORAL | Status: DC | PRN

## 2016-11-02 MED ORDER — PANTOPRAZOLE SODIUM 40 MG PO TBEC
40.0000 mg | DELAYED_RELEASE_TABLET | Freq: Two times a day (BID) | ORAL | Status: DC
  Administered 2016-11-03 – 2016-11-07 (×10): 40 mg via ORAL
  Filled 2016-11-02 (×10): qty 1

## 2016-11-02 MED ORDER — AMLODIPINE BESYLATE 10 MG PO TABS
10.0000 mg | ORAL_TABLET | Freq: Every day | ORAL | Status: DC
  Administered 2016-11-03 – 2016-11-04 (×2): 10 mg via ORAL
  Filled 2016-11-02 (×2): qty 1

## 2016-11-02 MED ORDER — SENNA 8.6 MG PO TABS: 1.0000 | ORAL_TABLET | Freq: Every day | ORAL | Status: DC | PRN

## 2016-11-02 MED ORDER — MORPHINE SULFATE (PF) 2 MG/ML IV SOLN
4.0000 mg | Freq: Once | INTRAVENOUS | Status: AC
  Administered 2016-11-03: 4 mg via INTRAVENOUS
  Filled 2016-11-02: qty 2

## 2016-11-02 MED ORDER — CLONIDINE HCL 0.1 MG PO TABS
0.1000 mg | ORAL_TABLET | Freq: Three times a day (TID) | ORAL | Status: DC
  Administered 2016-11-03 – 2016-11-04 (×5): 0.1 mg via ORAL
  Filled 2016-11-02 (×5): qty 1

## 2016-11-02 MED ORDER — PROMETHAZINE HCL 25 MG PO TABS: 25.0000 mg | ORAL_TABLET | Freq: Four times a day (QID) | ORAL | Status: DC | PRN

## 2016-11-02 MED ORDER — VENLAFAXINE HCL ER 75 MG PO CP24
75.0000 mg | ORAL_CAPSULE | Freq: Every day | ORAL | Status: DC
  Administered 2016-11-03 – 2016-11-07 (×5): 75 mg via ORAL
  Filled 2016-11-02 (×5): qty 1

## 2016-11-02 MED ORDER — DICLOFENAC SODIUM 1 % TD GEL: 2.0000 g | Freq: Four times a day (QID) | TRANSDERMAL | Status: DC | PRN

## 2016-11-02 MED ORDER — ACETAMINOPHEN 325 MG PO TABS
650.0000 mg | ORAL_TABLET | Freq: Four times a day (QID) | ORAL | Status: DC | PRN
  Administered 2016-11-04: 650 mg via ORAL
  Filled 2016-11-02: qty 2

## 2016-11-02 MED ORDER — ENOXAPARIN SODIUM 60 MG/0.6ML ~~LOC~~ SOLN
1.0000 mg/kg | SUBCUTANEOUS | Status: DC
  Filled 2016-11-02: qty 0.6

## 2016-11-02 MED ORDER — ONDANSETRON HCL 4 MG/2ML IJ SOLN
4.0000 mg | Freq: Four times a day (QID) | INTRAMUSCULAR | Status: DC | PRN
  Administered 2016-11-06: 4 mg via INTRAVENOUS
  Filled 2016-11-02: qty 2

## 2016-11-02 MED ORDER — IOPAMIDOL (ISOVUE-370) INJECTION 76%
100.0000 mL | Freq: Once | INTRAVENOUS | Status: AC | PRN
  Administered 2016-11-02: 100 mL via INTRAVENOUS

## 2016-11-02 MED ORDER — ENOXAPARIN SODIUM 60 MG/0.6ML ~~LOC~~ SOLN: 1.0000 mg/kg | Freq: Two times a day (BID) | SUBCUTANEOUS | Status: DC

## 2016-11-02 NOTE — Telephone Encounter (Signed)
See alternate note  

## 2016-11-02 NOTE — ED Notes (Signed)
No answer for blood draw.

## 2016-11-02 NOTE — ED Triage Notes (Signed)
Pt sent from EMS for increase in chronic central chest pain yesterday after taking Tizanidine. Diagnosed with lung cancer today. Chronic stable SOB without new changes.

## 2016-11-02 NOTE — Progress Notes (Signed)
Order sent to [facility] 

## 2016-11-02 NOTE — ED Notes (Signed)
Second call, no answer for blood draw.

## 2016-11-02 NOTE — Assessment & Plan Note (Signed)
Healed according to upper EGD on 10/08/2016

## 2016-11-02 NOTE — ED Provider Notes (Signed)
[facility] DEPT Provider Note   CSN: [id number] Arrival date & time: 11/02/16  1348     History   Chief Complaint Chief Complaint  Patient presents with  . Chest Pain    HPI Emily Cohen is a 66 y.o. female.  The history is provided by the patient.  Chest Pain   This is a recurrent problem. Episode onset: 8 weeks. The problem occurs constantly. Progression since onset: fluctuating. The pain is associated with movement and exertion. The pain is present in the substernal region. The pain is moderate. Quality: "pulling" Radiates to: migrates around. The symptoms are aggravated by certain positions, deep breathing and exertion (eating, smoking). Associated symptoms include back pain, cough and shortness of breath. Pertinent negatives include no fever, no hemoptysis, no leg pain, no lower extremity edema, no nausea and no vomiting. Risk factors include being elderly and smoking/tobacco exposure.  Her past medical history is significant for COPD, hyperlipidemia and hypertension.  Pertinent negatives for past medical history include no CAD, no diabetes, no MI, no PE and no strokes.   Told today that she had metastatic cancer or unknown primary source.  On record review, CT with number thoracic compression fractures from T3 to T8 with lytic lesions. PET scan with likely lung primary source of metastatic disease.   Past Medical History:  Diagnosis Date  . Alcohol abuse 2014  . Anxiety   . Arthritis    "maybe in my legs" (10/07/2016)  . Breast cancer, left breast ([place]) <2002  . Chronic back pain   . Depression   . Gastric ulcer 05/2016   caused hematemesis.  GU with VV cauterized at EGD.  using NSAIDs.  biopsy negative for H Pylori.    Marland Kitchen GERD (gastroesophageal reflux disease)   . Hypertension   . Left humeral fracture 01/2016   did not follow up with ortho as planned  . Pancreatitis 2014   likely due to ETOH  . Pre-diabetes     Patient Active Problem List   Diagnosis  Date Noted  . Metastatic cancer to bone ([place]) 11/02/2016  . Acute pulmonary embolism ([place]) 11/02/2016  . COPD GOLD 0 / still smoking 10/16/2016  . Solitary pulmonary nodule 10/16/2016  . Cigarette smoker 10/16/2016  . Back pain 10/09/2016  . Lumbar disc lesion 10/09/2016  . Bronchitis due to tobacco use ([place]) 10/09/2016  . Encounter for screening for lung cancer 10/09/2016  . Benign neoplasm of ascending colon   . Benign neoplasm of transverse colon   . Diverticulosis of colon without hemorrhage   . Generalized abdominal pain   . Lower esophageal ring   . Rectal bleeding 10/07/2016  . Essential hypertension 10/07/2016  . Diabetes mellitus 10/07/2016  . Alcohol use 10/07/2016  . Arthritis 10/07/2016  . HLD (hyperlipidemia) 10/07/2016  . Symptomatic anemia 10/07/2016  . Pancreatic calcification 10/07/2016  . Constipation 10/07/2016  . Alcohol-induced chronic pancreatitis ([place])   . Left lower quadrant pain   . History of gastric ulcer   . Abnormal CT of the abdomen   . Chest pain 08/24/2016  . Respiratory failure ([place])   . Acute post-hemorrhagic anemia   . Alcohol abuse   . Acute upper GI bleed 05/22/2016    Past Surgical History:  Procedure Laterality Date  . ABDOMINAL HYSTERECTOMY    . BREAST BIOPSY Left <2002  . COLONOSCOPY N/A 10/08/2016   Procedure: COLONOSCOPY;  Surgeon: Irene Shipper, MD;  Location: [facility] ENDOSCOPY;  Service: Endoscopy;  Laterality: N/A;  .  ESOPHAGOGASTRODUODENOSCOPY N/A 05/23/2016   Procedure: ESOPHAGOGASTRODUODENOSCOPY (EGD);  Surgeon: Milus Banister, MD;  Location: [facility];  Service: Endoscopy;  Laterality: N/A;  . ESOPHAGOGASTRODUODENOSCOPY  05/2016   Dr Owens Loffler.  for hematemesis.  cauterized a large GU, gastritis noted.  H pylori negativ on biopsy  . ESOPHAGOGASTRODUODENOSCOPY N/A 10/08/2016   Procedure: ESOPHAGOGASTRODUODENOSCOPY (EGD);  Surgeon: Irene Shipper, MD;  Location: [facility] ENDOSCOPY;  Service: Endoscopy;  Laterality: N/A;  .  MASTECTOMY Left <2002   for cancer  . TONSILLECTOMY      OB History    No data available       Home Medications    Prior to Admission medications   Medication Sig Start Date End Date Taking? Authorizing Provider  amLODipine (NORVASC) 10 MG tablet Take 10 mg by mouth daily.  10/02/16  Yes [provider]  CARAFATE 1 GM/10ML suspension Take 10 mLs (1 g total) by mouth 4 (four) times daily. 10/09/16 11/08/16 Yes Amin, Jeanella Flattery, MD  cloNIDine (CATAPRES) 0.1 MG tablet Take 1 tablet (0.1 mg total) by mouth 3 (three) times daily. 10/16/16  Yes Tanda Rockers, MD  methocarbamol (ROBAXIN) 500 MG tablet Take 500 mg by mouth 2 (two) times daily.   Yes [provider]  pantoprazole (PROTONIX) 40 MG tablet Take 1 tablet (40 mg total) by mouth 2 (two) times daily. Patient taking differently: Take 40 mg by mouth daily.  10/09/16  Yes Amin, Ankit Chirag, MD  polyethylene glycol powder (GLYCOLAX/MIRALAX) powder Take 2 doses now and then twice daily as needed for constipation Patient taking differently: Take 1 Container by mouth daily.  11/01/16  Yes Armbruster, Renelda Loma, MD  promethazine (PHENERGAN) 25 MG tablet Take 25 mg by mouth every 6 (six) hours.   Yes [provider]  tiZANidine (ZANAFLEX) 4 MG tablet Take 4-8 mg by mouth every 8 (eight) hours as needed for muscle spasms.   Yes [provider]  venlafaxine XR (EFFEXOR-XR) 75 MG 24 hr capsule Take 75 mg by mouth daily. 10/04/16  Yes [provider]  diclofenac sodium (VOLTAREN) 1 % GEL Apply 2 g topically 4 (four) times daily. Apply to right shoulder. Patient taking differently: Apply 2 g topically 4 (four) times daily as needed (for right shoulder pain).  08/25/16   Arrien, Jimmy Picket, MD  HYDROcodone-acetaminophen (NORCO/VICODIN) 5-325 MG tablet Take 1-2 tablets by mouth every 4 (four) hours as needed for severe pain. Patient not taking: Reported on 10/29/2016 10/16/16   Tanda Rockers, MD  senna  (SENOKOT) 8.6 MG TABS tablet Take 1 tablet (8.6 mg total) by mouth daily as needed for mild constipation. 10/09/16   Damita Lack, MD    Family History Family History  Problem Relation Age of Onset  . Hypertension Mother   . Diabetes Mother   . Diabetes Father   . Colon cancer Neg Hx   . Rectal cancer Neg Hx   . Esophageal cancer Neg Hx     Social History Social History  Substance Use Topics  . Smoking status: Current Every Day Smoker    Packs/day: 1.00    Years: 47.00    Types: Cigarettes  . Smokeless tobacco: Never Used  . Alcohol use Yes     Comment: 10/07/2016 "nothing in 7 months; heavy drinker before that"     Allergies   Patient has no known allergies.   Review of Systems Review of Systems  Constitutional: Negative for fever.  Respiratory: Positive for cough and  shortness of breath. Negative for hemoptysis.   Cardiovascular: Positive for chest pain.  Gastrointestinal: Negative for nausea and vomiting.  Musculoskeletal: Positive for back pain.   All other systems are reviewed and are negative for acute change except as noted in the HPI  Physical Exam Updated Vital Signs BP 119/60   Pulse (!) 103   Temp 98.7 F (37.1 C) (Oral)   Resp 18   SpO2 95%   Physical Exam  Constitutional: She is oriented to person, place, and time. She appears well-developed and well-nourished. No distress.  HENT:  Head: Normocephalic and atraumatic.  Nose: Nose normal.  Eyes: Pupils are equal, round, and reactive to light. Conjunctivae and EOM are normal. Right eye exhibits no discharge. Left eye exhibits no discharge. No scleral icterus.  Neck: Normal range of motion. Neck supple.  Cardiovascular: Normal rate and regular rhythm.  Exam reveals no gallop and no friction rub.   No murmur heard. Pulmonary/Chest: Effort normal and breath sounds normal. No stridor. No respiratory distress. She has no rales. She exhibits tenderness.    Abdominal: Soft. She exhibits no  distension. There is no tenderness.  Musculoskeletal: She exhibits no edema or tenderness.  Neurological: She is alert and oriented to person, place, and time.  Skin: Skin is warm and dry. No rash noted. She is not diaphoretic. No erythema.  Psychiatric: She has a normal mood and affect.  Vitals reviewed.    ED Treatments / Results  Labs (all labs ordered are listed, but only abnormal results are displayed) Labs Reviewed  CBC - Abnormal; Notable for the following:       Result Value   WBC 15.4 (*)    Hemoglobin 10.1 (*)    HCT 29.8 (*)    MCV 75.1 (*)    MCH 25.4 (*)    RDW 21.1 (*)    Platelets 491 (*)    All other components within normal limits  COMPREHENSIVE METABOLIC PANEL - Abnormal; Notable for the following:    Potassium 3.0 (*)    Chloride 99 (*)    Glucose, Bld 112 (*)    ALT 12 (*)    Alkaline Phosphatase 143 (*)    All other components within normal limits  D-DIMER, QUANTITATIVE (NOT AT [facility]) - Abnormal; Notable for the following:    D-Dimer, Quant >20.00 (*)    All other components within normal limits  I-STAT TROPONIN, ED  POCT I-STAT TROPONIN I    EKG  EKG Interpretation None       Radiology Dg Chest 2 View  Result Date: 11/02/2016 CLINICAL DATA:  Chronic central chest pain, worsened EXAM: CHEST  2 VIEW COMPARISON:  Head CT 10/30/2016, radiograph 10/04/2016, CT 10/09/2016 FINDINGS: No acute pulmonary infiltrate, consolidation, or pleural effusion is seen. Surgical clips over the left chest. Post mastectomy changes on the left. Stable cardiomediastinal silhouette. Stable compression deformities of the thoracic and lumbar spine. Partially visualized left proximal humerus fracture. Possible healing left tenth rib fracture. Right tenth rib fracture. IMPRESSION: 1. No acute infiltrate or edema. 2. Stable appearance of spinal compression deformities. Acute right tenth rib fracture. Possible healing left tenth rib fracture. Electronically Signed   By: Donavan Foil M.D.   On: 11/02/2016 15:28   Ct Angio Chest Pe W And/or Wo Contrast  Result Date: 11/02/2016 CLINICAL DATA:  66 year old female with chronic chest wall pain and lung cancer EXAM: CT ANGIOGRAPHY CHEST WITH CONTRAST TECHNIQUE: Multidetector CT imaging of the chest was performed using the  standard protocol during bolus administration of intravenous contrast. Multiplanar CT image reconstructions and MIPs were obtained to evaluate the vascular anatomy. CONTRAST:  100 mL Isovue 370 COMPARISON:  Recent prior imaging including PET-CT 11/02/2016 ; prior chest CT 10/09/2016 FINDINGS: Cardiovascular: Aberrant right subclavian artery. No evidence of aortic aneurysm. Adequate opacification of the pulmonary arteries to the segmental level. Positive for a small filling defects within the postero basal segmental right lower lobe pulmonary artery. No additional pulmonary emboli are identified. The heart is normal in size. No pericardial effusion. Mediastinum/Nodes: Unremarkable CT appearance of the thyroid gland. No suspicious mediastinal or hilar adenopathy. No soft tissue mediastinal mass. The thoracic esophagus is unremarkable. Lungs/Pleura: 1 cm peripherally spiculated nodule in the right lower lobe. Mild dependent atelectasis in the right lower lobe. No new focal airspace consolidation, pleural effusion, pneumothorax or pulmonary edema. Upper Abdomen: The visualized portion of the upper abdomen is unremarkable. Musculoskeletal: Surgical changes of prior left mastectomy and breast reconstruction. Stable T3 compression fracture. Compression fracture involving the inferior endplate of T8 is new compared to 10/09/2016. Nondisplaced pathologic fracture through the left aspect of the manubrium again identified. Incompletely imaged right tenth rib fracture. Known multifocal osseous metastatic disease is better demonstrated on the recent prior PET-CT. Review of the MIP images confirms the above findings. IMPRESSION: 1.  Positive for isolated segmental pulmonary embolus in the right lower lobe postero basal segmental pulmonary artery. The solitary small pulmonary embolus may or may not be contributing to the patient's described clinical symptoms. 2. New T8 compression fracture compared to the CT scan of the chest dated 10/09/2016. 3. Stable T3 compression fracture, right tenth rib a pathologic fracture and manubrial pathologic fracture. 4. Multifocal osseous metastatic disease better demonstrated on recent PET-CT. 5. Additional ancillary findings as above without interval change over the last 3 days. Electronically Signed   By: Jacqulynn Cadet M.D.   On: 11/02/2016 20:46    Procedures Procedures (including critical care time) CRITICAL CARE Performed by: Grayce Sessions Rocio Roam Total critical care time: 30 minutes Critical care time was exclusive of separately billable procedures and treating other patients. Critical care was necessary to treat or prevent imminent or life-threatening deterioration. Critical care was time spent personally by me on the following activities: development of treatment plan with patient and/or surrogate as well as nursing, discussions with consultants, evaluation of patient's response to treatment, examination of patient, obtaining history from patient or surrogate, ordering and performing treatments and interventions, ordering and review of laboratory studies, ordering and review of radiographic studies, pulse oximetry and re-evaluation of patient's condition.   Medications Ordered in ED Medications  iopamidol (ISOVUE-370) 76 % injection (not administered)  morphine 2 MG/ML injection 4 mg (not administered)  morphine 2 MG/ML injection 2-4 mg (not administered)  nicotine (NICODERM CQ - dosed in mg/24 hours) patch 21 mg (not administered)  amLODipine (NORVASC) tablet 10 mg (not administered)  cloNIDine (CATAPRES) tablet 0.1 mg (not administered)  sucralfate (CARAFATE) 1 GM/10ML  suspension 1 g (not administered)  venlafaxine XR (EFFEXOR-XR) 24 hr capsule 75 mg (not administered)  tiZANidine (ZANAFLEX) tablet 4-8 mg (not administered)  senna (SENOKOT) tablet 8.6 mg (not administered)  pantoprazole (PROTONIX) EC tablet 40 mg (not administered)  polyethylene glycol powder (GLYCOLAX/MIRALAX) container 255 g (not administered)  diclofenac sodium (VOLTAREN) 1 % transdermal gel 2 g (not administered)  promethazine (PHENERGAN) tablet 25 mg (not administered)  potassium chloride SA (K-DUR,KLOR-CON) CR tablet 40 mEq (not administered)  heparin ADULT infusion 100 units/mL (25000  units/276mL sodium chloride 0.45%) (not administered)  heparin bolus via infusion 4,000 Units (not administered)  sodium chloride 0.9 % bolus 1,000 mL (0 mLs Intravenous Stopped 11/02/16 1953)  ketorolac (TORADOL) 15 MG/ML injection 15 mg (15 mg Intravenous Given 11/02/16 2038)  morphine 2 MG/ML injection 2 mg (2 mg Intravenous Given 11/02/16 2038)  iopamidol (ISOVUE-370) 76 % injection 100 mL (100 mLs Intravenous Contrast Given 11/02/16 2020)     Initial Impression / Assessment and Plan / ED Course  I have reviewed the triage vital signs and the nursing notes.  Pertinent labs & imaging results that were available during my care of the patient were reviewed by me and considered in my medical decision making (see chart for details).     Workup revealed new pathologic fractures of the ribs and T8. Also revealed a new pulmonary embolism. Started patient on heparin drip. Patient with new diagnosis of cancer. Will admit for further workup and management.  Final Clinical Impressions(s) / ED Diagnoses   Final diagnoses:  Lung mass  Metastatic disease ([place])  Pathological fracture of vertebra, unspecified pathological cause, initial encounter  Other acute pulmonary embolism without acute cor pulmonale (HCC)      Kyandra Mcclaine, Grayce Sessions, MD 11/02/16 2355

## 2016-11-02 NOTE — Telephone Encounter (Signed)
Spoke with unnamed female, states that pt is being transported to ED at this time.  Will close encounter.

## 2016-11-02 NOTE — ED Triage Notes (Signed)
Per EMS-states patient has chronic chest wall pain related to a recent diagnosis of lung cancer-states increased pain yesterday after taking Tizanidine

## 2016-11-02 NOTE — H&P (Addendum)
History and Physical    Emily Cohen GQQ:[id number] DOB: 1950-07-11 DOA: 11/02/2016  PCP: Benito Mccreedy, MD  Patient coming from: Home  I have personally briefly reviewed patient's old medical records in [place]  Chief Complaint: Chest pain  HPI: Emily Cohen is a 66 y.o. female with medical history significant of smoking, EtOH abuse in past (nothing for past 8 months), gastric ulcer in Feb that is healed on EGD last month, BRCA s/p mastectomy.  Patient was diagnosed with metastatic cancer of unknown primary on PET-CT scan on the 20th.  Was just told about this today in office visit.  Presents to ED today with 8 week history of chest pain, back pain, cough.  Worse today.  Nothing makes better or worse.  Symptoms are severe.   ED Course: D.Dimer >20.  CTA chest confirms segmental PE, also has a new pathologic thoracic compression fracture at T8 and stable pathologic compression fx at T3.   Review of Systems: As per HPI otherwise 10 point review of systems negative.   Past Medical History:  Diagnosis Date  . Alcohol abuse 2014  . Anxiety   . Arthritis    "maybe in my legs" (10/07/2016)  . Breast cancer, left breast ([place]) <2002  . Chronic back pain   . Depression   . Gastric ulcer 05/2016   caused hematemesis.  GU with VV cauterized at EGD.  using NSAIDs.  biopsy negative for H Pylori.    Marland Kitchen GERD (gastroesophageal reflux disease)   . Hypertension   . Left humeral fracture 01/2016   did not follow up with ortho as planned  . Pancreatitis 2014   likely due to ETOH  . Pre-diabetes     Past Surgical History:  Procedure Laterality Date  . ABDOMINAL HYSTERECTOMY    . BREAST BIOPSY Left <2002  . COLONOSCOPY N/A 10/08/2016   Procedure: COLONOSCOPY;  Surgeon: Irene Shipper, MD;  Location: [facility] ENDOSCOPY;  Service: Endoscopy;  Laterality: N/A;  . ESOPHAGOGASTRODUODENOSCOPY N/A 05/23/2016   Procedure: ESOPHAGOGASTRODUODENOSCOPY (EGD);  Surgeon: Milus Banister,  MD;  Location: [place];  Service: Endoscopy;  Laterality: N/A;  . ESOPHAGOGASTRODUODENOSCOPY  05/2016   Dr Owens Loffler.  for hematemesis.  cauterized a large GU, gastritis noted.  H pylori negativ on biopsy  . ESOPHAGOGASTRODUODENOSCOPY N/A 10/08/2016   Procedure: ESOPHAGOGASTRODUODENOSCOPY (EGD);  Surgeon: Irene Shipper, MD;  Location[facility] ENDOSCOPY;  Service: Endoscopy;  Laterality: N/A;  . MASTECTOMY Left <2002   for cancer  . TONSILLECTOMY       reports that she has been smoking Cigarettes.  She has a 47.00 pack-year smoking history. She has never used smokeless tobacco. She reports that she drinks alcohol. She reports that she does not use drugs.  No Known Allergies  Family History  Problem Relation Age of Onset  . Hypertension Mother   . Diabetes Mother   . Diabetes Father   . Colon cancer Neg Hx   . Rectal cancer Neg Hx   . Esophageal cancer Neg Hx      Prior to Admission medications   Medication Sig Start Date End Date Taking? Authorizing Provider  amLODipine (NORVASC) 10 MG tablet Take 10 mg by mouth daily.  10/02/16  Yes [provider]  CARAFATE 1 GM/10ML suspension Take 10 mLs (1 g total) by mouth 4 (four) times daily. 10/09/16 11/08/16 Yes Amin, Jeanella Flattery, MD  cloNIDine (CATAPRES) 0.1 MG tablet Take 1 tablet (0.1 mg total) by mouth 3 (three)  times daily. 10/16/16  Yes Tanda Rockers, MD  methocarbamol (ROBAXIN) 500 MG tablet Take 500 mg by mouth 2 (two) times daily.   Yes [provider]  pantoprazole (PROTONIX) 40 MG tablet Take 1 tablet (40 mg total) by mouth 2 (two) times daily. Patient taking differently: Take 40 mg by mouth daily.  10/09/16  Yes Amin, Ankit Chirag, MD  polyethylene glycol powder (GLYCOLAX/MIRALAX) powder Take 2 doses now and then twice daily as needed for constipation Patient taking differently: Take 1 Container by mouth daily.  11/01/16  Yes Armbruster, Renelda Loma, MD  promethazine (PHENERGAN) 25 MG tablet Take 25 mg by mouth  every 6 (six) hours.   Yes [provider]  tiZANidine (ZANAFLEX) 4 MG tablet Take 4-8 mg by mouth every 8 (eight) hours as needed for muscle spasms.   Yes [provider]  venlafaxine XR (EFFEXOR-XR) 75 MG 24 hr capsule Take 75 mg by mouth daily. 10/04/16  Yes [provider]  diclofenac sodium (VOLTAREN) 1 % GEL Apply 2 g topically 4 (four) times daily. Apply to right shoulder. Patient taking differently: Apply 2 g topically 4 (four) times daily as needed (for right shoulder pain).  08/25/16   Arrien, Jimmy Picket, MD  HYDROcodone-acetaminophen (NORCO/VICODIN) 5-325 MG tablet Take 1-2 tablets by mouth every 4 (four) hours as needed for severe pain. Patient not taking: Reported on 10/29/2016 10/16/16   Tanda Rockers, MD  senna (SENOKOT) 8.6 MG TABS tablet Take 1 tablet (8.6 mg total) by mouth daily as needed for mild constipation. 10/09/16   Damita Lack, MD    Physical Exam: Vitals:   11/02/16 2000 11/02/16 2030 11/02/16 2230 11/02/16 2300  BP: (!) 144/72 (!) 145/66 140/80 124/69  Pulse: 88 94 99 88  Resp: 14 19 (!) 23 16  Temp:      TempSrc:      SpO2: 98% 98% 95% 94%    Constitutional: NAD, calm, comfortable Eyes: PERRL, lids and conjunctivae normal ENMT: Mucous membranes are moist. Posterior pharynx clear of any exudate or lesions.Normal dentition.  Neck: normal, supple, no masses, no thyromegaly Respiratory: clear to auscultation bilaterally, no wheezing, no crackles. Normal respiratory effort. No accessory muscle use.  Cardiovascular: Regular rate and rhythm, no murmurs / rubs / gallops. No extremity edema. 2+ pedal pulses. No carotid bruits.  Abdomen: no tenderness, no masses palpated. No hepatosplenomegaly. Bowel sounds positive.  Musculoskeletal: R humerus deformity. Skin: no rashes, lesions, ulcers. No induration Neurologic: CN 2-12 grossly intact. Sensation intact, DTR normal. Strength 5/5 in all 4.  Psychiatric: Normal judgment and insight.  Alert and oriented x 3. Normal mood.    Labs on Admission: I have personally reviewed following labs and imaging studies  CBC:  Recent Labs Lab 11/02/16 1433  WBC 15.4*  HGB 10.1*  HCT 29.8*  MCV 75.1*  PLT 315*   Basic Metabolic Panel:  Recent Labs Lab 11/02/16 1745  NA 138  K 3.0*  CL 99*  CO2 25  GLUCOSE 112*  BUN 7  CREATININE 0.61  CALCIUM 10.1   GFR: Estimated Creatinine Clearance: 63.1 mL/min (by C-G formula based on SCr of 0.61 mg/dL). Liver Function Tests:  Recent Labs Lab 11/02/16 1745  AST 16  ALT 12*  ALKPHOS 143*  BILITOT 1.2  PROT 7.6  ALBUMIN 4.1   No results for input(s): LIPASE, AMYLASE in the last 168 hours. No results for input(s): AMMONIA in the last 168 hours. Coagulation Profile: No results for input(s): INR,  PROTIME in the last 168 hours. Cardiac Enzymes: No results for input(s): CKTOTAL, CKMB, CKMBINDEX, TROPONINI in the last 168 hours. BNP (last 3 results) No results for input(s): PROBNP in the last 8760 hours. HbA1C: No results for input(s): HGBA1C in the last 72 hours. CBG:  Recent Labs Lab 10/30/16 1039  GLUCAP 119*   Lipid Profile: No results for input(s): CHOL, HDL, LDLCALC, TRIG, CHOLHDL, LDLDIRECT in the last 72 hours. Thyroid Function Tests: No results for input(s): TSH, T4TOTAL, FREET4, T3FREE, THYROIDAB in the last 72 hours. Anemia Panel: No results for input(s): VITAMINB12, FOLATE, FERRITIN, TIBC, IRON, RETICCTPCT in the last 72 hours. Urine analysis:    Component Value Date/Time   COLORURINE STRAW (A) 10/18/2016 2353   APPEARANCEUR CLEAR 10/18/2016 2353   LABSPEC 1.013 10/18/2016 2353   PHURINE 6.0 10/18/2016 2353   GLUCOSEU NEGATIVE 10/18/2016 2353   HGBUR NEGATIVE 10/18/2016 2353   BILIRUBINUR NEGATIVE 10/18/2016 2353   KETONESUR NEGATIVE 10/18/2016 2353   PROTEINUR NEGATIVE 10/18/2016 2353   UROBILINOGEN 0.2 02/20/2013 1609   NITRITE NEGATIVE 10/18/2016 2353   LEUKOCYTESUR NEGATIVE 10/18/2016 2353     Radiological Exams on Admission: Dg Chest 2 View  Result Date: 11/02/2016 CLINICAL DATA:  Chronic central chest pain, worsened EXAM: CHEST  2 VIEW COMPARISON:  Head CT 10/30/2016, radiograph 10/04/2016, CT 10/09/2016 FINDINGS: No acute pulmonary infiltrate, consolidation, or pleural effusion is seen. Surgical clips over the left chest. Post mastectomy changes on the left. Stable cardiomediastinal silhouette. Stable compression deformities of the thoracic and lumbar spine. Partially visualized left proximal humerus fracture. Possible healing left tenth rib fracture. Right tenth rib fracture. IMPRESSION: 1. No acute infiltrate or edema. 2. Stable appearance of spinal compression deformities. Acute right tenth rib fracture. Possible healing left tenth rib fracture. Electronically Signed   By: Donavan Foil M.D.   On: 11/02/2016 15:28   Ct Angio Chest Pe W And/or Wo Contrast  Result Date: 11/02/2016 CLINICAL DATA:  66 year old female with chronic chest wall pain and lung cancer EXAM: CT ANGIOGRAPHY CHEST WITH CONTRAST TECHNIQUE: Multidetector CT imaging of the chest was performed using the standard protocol during bolus administration of intravenous contrast. Multiplanar CT image reconstructions and MIPs were obtained to evaluate the vascular anatomy. CONTRAST:  100 mL Isovue 370 COMPARISON:  Recent prior imaging including PET-CT 11/02/2016 ; prior chest CT 10/09/2016 FINDINGS: Cardiovascular: Aberrant right subclavian artery. No evidence of aortic aneurysm. Adequate opacification of the pulmonary arteries to the segmental level. Positive for a small filling defects within the postero basal segmental right lower lobe pulmonary artery. No additional pulmonary emboli are identified. The heart is normal in size. No pericardial effusion. Mediastinum/Nodes: Unremarkable CT appearance of the thyroid gland. No suspicious mediastinal or hilar adenopathy. No soft tissue mediastinal mass. The thoracic esophagus is  unremarkable. Lungs/Pleura: 1 cm peripherally spiculated nodule in the right lower lobe. Mild dependent atelectasis in the right lower lobe. No new focal airspace consolidation, pleural effusion, pneumothorax or pulmonary edema. Upper Abdomen: The visualized portion of the upper abdomen is unremarkable. Musculoskeletal: Surgical changes of prior left mastectomy and breast reconstruction. Stable T3 compression fracture. Compression fracture involving the inferior endplate of T8 is new compared to 10/09/2016. Nondisplaced pathologic fracture through the left aspect of the manubrium again identified. Incompletely imaged right tenth rib fracture. Known multifocal osseous metastatic disease is better demonstrated on the recent prior PET-CT. Review of the MIP images confirms the above findings. IMPRESSION: 1. Positive for isolated segmental pulmonary embolus in the right lower lobe  postero basal segmental pulmonary artery. The solitary small pulmonary embolus may or may not be contributing to the patient's described clinical symptoms. 2. New T8 compression fracture compared to the CT scan of the chest dated 10/09/2016. 3. Stable T3 compression fracture, right tenth rib a pathologic fracture and manubrial pathologic fracture. 4. Multifocal osseous metastatic disease better demonstrated on recent PET-CT. 5. Additional ancillary findings as above without interval change over the last 3 days. Electronically Signed   By: Jacqulynn Cadet M.D.   On: 11/02/2016 20:46    EKG: Independently reviewed.  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 Acute PE - 1. Ultimately will need to be on lovenox when discharged due to association with metastatic CA 2. For now however will use heparin gtt for reversibility and intent to get Bx during hospital stay for  tissue diagnosis of the metastatic cancer with unknown primary (see below). 2. Metastatic cancer to bone with pathologic fx - primary lung (81 PY smoking Hx) vs breast (h/o breast cancer with prior mastectomy) vs other. 1. Morphine PRN pain control 2. IR consult to eval and attempt biopsy for tissue diagnosis, multiple bony targets.  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 3. Of note, unclear based on clinical history if L humerus fx pathologic or not.  Patient has been having pain and deformity since she fell back in Jan.  PET CT does call a pathologic Fx though. 4. Will leave decision of what to go after for tissue diagnosis up to IR. 3. H/o Gastric ulcer with bleeding - Resolved as demonstrated on upper EGD last month 4. Hypokalemia - replace 5. Chronic hypochromic microcytic anemia - suspicious for iron deficiency given h/o GIB earlier this year from ulcer (see above).  Will get iron studies to confirm.  Start Iron Replacement if confirmed. 6. HTN - continue home BP meds  DVT prophylaxis: Heparin gtt Code Status: Full Family Communication: No family in room Disposition Plan: Home after admit Consults called: IR consult placed in computer Admission status: Admit to inpatient - PE treatment complicated by need to pause anticoagulation for biopsy for tissue diagnosis.   Etta Quill DO [facility] Pager [phone]  If 7AM-7PM, please contact day team taking care of patient [email or web address] Password [facility]  11/02/2016, 11:56 PM

## 2016-11-02 NOTE — Progress Notes (Addendum)
ANTICOAGULATION CONSULT NOTE - Initial Consult  Pharmacy Consult for Heparin Indication: pulmonary embolus  No Known Allergies  Patient Measurements:   Heparin Dosing Weight: 61.2 kg  Vital Signs: Temp: 98.7 F (37.1 C) (07/23 1435) Temp Source: Oral (07/23 1435) BP: 124/69 (07/23 2300) Pulse Rate: 88 (07/23 2300)  Labs:  Recent Labs  11/02/16 1433 11/02/16 1745  HGB 10.1*  --   HCT 29.8*  --   PLT 491*  --   CREATININE  --  0.61    Estimated Creatinine Clearance: 63.1 mL/min (by C-G formula based on SCr of 0.61 mg/dL).   Medical History: Past Medical History:  Diagnosis Date  . Alcohol abuse 2014  . Anxiety   . Arthritis    "maybe in my legs" (10/07/2016)  . Breast cancer, left breast ([place]) <2002  . Chronic back pain   . Depression   . Gastric ulcer 05/2016   caused hematemesis.  GU with VV cauterized at EGD.  using NSAIDs.  biopsy negative for H Pylori.    Marland Kitchen GERD (gastroesophageal reflux disease)   . Hypertension   . Left humeral fracture 01/2016   did not follow up with ortho as planned  . Pancreatitis 2014   likely due to ETOH  . Pre-diabetes    Assessment: 66 yo F with lung cancer and new PE.  Pharmacy consulted to dose heparin.  Using heparin so IR can perform biopsy, then transition to [place]. Wt 61.2 kg, Creat WNL, Hg 10.1, pltc 491.  CT: RLL PE.  Goal of Therapy:   Heparin level 0.3 - 0.7 Monitor platelets by anticoagulation protocol: Yes   Plan:  Heparin 4000 unit bolus Heparin 1000 units/hr drip 6 hr heparin level Daily heparin level and CBC while on heparin Transition to [facility] after biopsy  Eudelia Bunch, Pharm.D. [phone] 11/02/2016 11:41 PM

## 2016-11-03 ENCOUNTER — Inpatient Hospital Stay (HOSPITAL_COMMUNITY): Payer: Medicare Other

## 2016-11-03 ENCOUNTER — Encounter (HOSPITAL_COMMUNITY): Payer: Self-pay

## 2016-11-03 ENCOUNTER — Telehealth: Payer: Self-pay | Admitting: Internal Medicine

## 2016-11-03 DIAGNOSIS — D509 Iron deficiency anemia, unspecified: Secondary | ICD-10-CM | POA: Diagnosis present

## 2016-11-03 LAB — CBC
HEMATOCRIT: 27.5 % — AB (ref 36.0–46.0)
HEMOGLOBIN: 9.1 g/dL — AB (ref 12.0–15.0)
MCH: 25.6 pg — ABNORMAL LOW (ref 26.0–34.0)
MCHC: 33.1 g/dL (ref 30.0–36.0)
MCV: 77.2 fL — ABNORMAL LOW (ref 78.0–100.0)
Platelets: 378 10*3/uL (ref 150–400)
RBC: 3.56 MIL/uL — AB (ref 3.87–5.11)
RDW: 21.3 % — ABNORMAL HIGH (ref 11.5–15.5)
WBC: 10.8 10*3/uL — AB (ref 4.0–10.5)

## 2016-11-03 LAB — BASIC METABOLIC PANEL
ANION GAP: 10 (ref 5–15)
BUN: 7 mg/dL (ref 6–20)
CO2: 26 mmol/L (ref 22–32)
Calcium: 9.5 mg/dL (ref 8.9–10.3)
Chloride: 104 mmol/L (ref 101–111)
Creatinine, Ser: 0.51 mg/dL (ref 0.44–1.00)
Glucose, Bld: 106 mg/dL — ABNORMAL HIGH (ref 65–99)
POTASSIUM: 3.9 mmol/L (ref 3.5–5.1)
SODIUM: 140 mmol/L (ref 135–145)

## 2016-11-03 LAB — IRON AND TIBC
Iron: 63 ug/dL (ref 28–170)
SATURATION RATIOS: 24 % (ref 10.4–31.8)
TIBC: 266 ug/dL (ref 250–450)
UIBC: 203 ug/dL

## 2016-11-03 LAB — HEPARIN LEVEL (UNFRACTIONATED)

## 2016-11-03 LAB — FERRITIN: FERRITIN: 150 ng/mL (ref 11–307)

## 2016-11-03 MED ORDER — POLYETHYLENE GLYCOL 3350 17 G PO PACK
17.0000 g | PACK | Freq: Every day | ORAL | Status: DC
  Administered 2016-11-03 – 2016-11-04 (×2): 17 g via ORAL
  Filled 2016-11-03 (×2): qty 1

## 2016-11-03 MED ORDER — SODIUM CHLORIDE 0.9 % IV SOLN
INTRAVENOUS | Status: AC
  Filled 2016-11-03: qty 250

## 2016-11-03 MED ORDER — ENSURE ENLIVE PO LIQD
237.0000 mL | Freq: Two times a day (BID) | ORAL | Status: DC
  Administered 2016-11-04 – 2016-11-06 (×4): 237 mL via ORAL

## 2016-11-03 MED ORDER — HEPARIN BOLUS VIA INFUSION
2000.0000 [IU] | Freq: Once | INTRAVENOUS | Status: AC
  Administered 2016-11-03: 2000 [IU] via INTRAVENOUS
  Filled 2016-11-03: qty 2000

## 2016-11-03 MED ORDER — MIDAZOLAM HCL 2 MG/2ML IJ SOLN
INTRAMUSCULAR | Status: AC | PRN
  Administered 2016-11-03 (×4): 1 mg via INTRAVENOUS

## 2016-11-03 MED ORDER — HEPARIN (PORCINE) IN NACL 100-0.45 UNIT/ML-% IJ SOLN: 1200.0000 [IU]/h | INTRAMUSCULAR | Status: DC

## 2016-11-03 MED ORDER — MORPHINE SULFATE (PF) 4 MG/ML IV SOLN
2.0000 mg | INTRAVENOUS | Status: DC | PRN
  Administered 2016-11-03: 4 mg via INTRAVENOUS
  Administered 2016-11-03 (×2): 2 mg via INTRAVENOUS
  Administered 2016-11-03 – 2016-11-04 (×2): 4 mg via INTRAVENOUS
  Administered 2016-11-04 (×3): 2 mg via INTRAVENOUS
  Administered 2016-11-04 – 2016-11-06 (×6): 4 mg via INTRAVENOUS
  Filled 2016-11-03 (×15): qty 1

## 2016-11-03 MED ORDER — MIDAZOLAM HCL 2 MG/2ML IJ SOLN
INTRAMUSCULAR | Status: AC
  Filled 2016-11-03: qty 4

## 2016-11-03 MED ORDER — FENTANYL CITRATE (PF) 100 MCG/2ML IJ SOLN
INTRAMUSCULAR | Status: AC
  Filled 2016-11-03: qty 2

## 2016-11-03 MED ORDER — LIDOCAINE HCL 1 % IJ SOLN
INTRAMUSCULAR | Status: DC | PRN
  Administered 2016-11-03: 10 mL via INTRADERMAL

## 2016-11-03 MED ORDER — FENTANYL CITRATE (PF) 100 MCG/2ML IJ SOLN
INTRAMUSCULAR | Status: AC | PRN
  Administered 2016-11-03 (×2): 50 ug via INTRAVENOUS

## 2016-11-03 MED ORDER — HEPARIN (PORCINE) IN NACL 100-0.45 UNIT/ML-% IJ SOLN
1200.0000 [IU]/h | INTRAMUSCULAR | Status: DC
  Administered 2016-11-03: 1200 [IU]/h via INTRAVENOUS
  Filled 2016-11-03: qty 250

## 2016-11-03 MED ORDER — ZOLPIDEM TARTRATE 5 MG PO TABS
5.0000 mg | ORAL_TABLET | Freq: Every evening | ORAL | Status: DC | PRN
  Administered 2016-11-03 – 2016-11-06 (×4): 5 mg via ORAL
  Filled 2016-11-03 (×4): qty 1

## 2016-11-03 NOTE — Consult Note (Signed)
Chief Complaint: Patient was seen in consultation today for lytic bone lesion  Referring Physician(s):  Dr. Christinia Gully  Supervising Physician: Markus Daft  Patient Status: [facility] - In-pt  History of Present Illness: Emily Cohen is a 66 y.o. female with past medical history of alcohol abuse, anxiety, arthritis, GERD, HTN, breast cancer with pathologic left humeral fracture who has presented to the ED several times over the past few months for complaint of back pain.   CT Chest showed pulmonary nodule. Patient was to follow-up with Pulmonology.  Patient underwent PET Scan 10/30/16 which showed: 1. Widespread hypermetabolic and lytic osseous metastatic disease, including pathologic fractures the left proximal humerus, right tenth rib, and left upper manubrium. 2. The right lower lobe nodule has a maximum standard uptake value of 3.3, favoring malignancy. 3. Hypermetabolic right adrenal mass, compatible with metastatic disease. 4. Subtle accentuated activity in the pancreatic head adjacent to chronic calcifications in this region. This could reflect low grade inflammation in setting of chronic calcific pancreatitis. Calcified pancreatic mass is considered less likely given the somewhat chronic appearance. 5. Questionable small metastatic focus in the left pectoralis major muscle. 6. Aortoiliac atherosclerotic vascular disease. Aberrant right subclavian artery passes behind the esophagus.  A request for biopsy was ordered by Dr. Christinia Gully.  Case was reviewed by Dr. Earleen Newport who approved patient for a right adrenal mass biopsy, however prior to being able to schedule biopsy appointment, patient was admitted for pulmonary embolus.  She is currently on heparin gtt.   IR consulted for biopsy as an inpatient.  Reviewed case with Dr. Anselm Pancoast.  Given medical status and review of PET scan, Dr. Anselm Pancoast recommends biopsy of the pubic bone lesion at this time.   Patient has been NPO as of 8AM.    Her heparin gtt has been held since noon.   Past Medical History:  Diagnosis Date  . Alcohol abuse 2014  . Anxiety   . Arthritis    "maybe in my legs" (10/07/2016)  . Breast cancer, left breast ([address]) <2002  . Chronic back pain   . Depression   . Gastric ulcer 05/2016   caused hematemesis.  GU with VV cauterized at EGD.  using NSAIDs.  biopsy negative for H Pylori.    Marland Kitchen GERD (gastroesophageal reflux disease)   . Hypertension   . Left humeral fracture 01/2016   did not follow up with ortho as planned  . Pancreatitis 2014   likely due to ETOH  . Pre-diabetes     Past Surgical History:  Procedure Laterality Date  . ABDOMINAL HYSTERECTOMY    . BREAST BIOPSY Left <2002  . COLONOSCOPY N/A 10/08/2016   Procedure: COLONOSCOPY;  Surgeon: Irene Shipper, MD;  Location: [facility] ENDOSCOPY;  Service: Endoscopy;  Laterality: N/A;  . ESOPHAGOGASTRODUODENOSCOPY N/A 05/23/2016   Procedure: ESOPHAGOGASTRODUODENOSCOPY (EGD);  Surgeon: Milus Banister, MD;  Location: [facility];  Service: Endoscopy;  Laterality: N/A;  . ESOPHAGOGASTRODUODENOSCOPY  05/2016   Dr Owens Loffler.  for hematemesis.  cauterized a large GU, gastritis noted.  H pylori negativ on biopsy  . ESOPHAGOGASTRODUODENOSCOPY N/A 10/08/2016   Procedure: ESOPHAGOGASTRODUODENOSCOPY (EGD);  Surgeon: Irene Shipper, MD;  Location: [facility] ENDOSCOPY;  Service: Endoscopy;  Laterality: N/A;  . MASTECTOMY Left <2002   for cancer  . TONSILLECTOMY      Allergies: Patient has no known allergies.  Medications: Prior to Admission medications   Medication Sig Start Date End Date Taking? Authorizing Provider  amLODipine (NORVASC) 10  MG tablet Take 10 mg by mouth daily.  10/02/16  Yes [provider]  CARAFATE 1 GM/10ML suspension Take 10 mLs (1 g total) by mouth 4 (four) times daily. 10/09/16 11/08/16 Yes Amin, Jeanella Flattery, MD  cloNIDine (CATAPRES) 0.1 MG tablet Take 1 tablet (0.1 mg total) by mouth 3 (three) times daily. 10/16/16  Yes Tanda Rockers, MD  methocarbamol (ROBAXIN) 500 MG tablet Take 500 mg by mouth 2 (two) times daily.   Yes [provider]  pantoprazole (PROTONIX) 40 MG tablet Take 1 tablet (40 mg total) by mouth 2 (two) times daily. Patient taking differently: Take 40 mg by mouth daily.  10/09/16  Yes Amin, Ankit Chirag, MD  polyethylene glycol powder (GLYCOLAX/MIRALAX) powder Take 2 doses now and then twice daily as needed for constipation Patient taking differently: Take 1 Container by mouth daily.  11/01/16  Yes Armbruster, Renelda Loma, MD  promethazine (PHENERGAN) 25 MG tablet Take 25 mg by mouth every 6 (six) hours.   Yes [provider]  tiZANidine (ZANAFLEX) 4 MG tablet Take 4-8 mg by mouth every 8 (eight) hours as needed for muscle spasms.   Yes [provider]  venlafaxine XR (EFFEXOR-XR) 75 MG 24 hr capsule Take 75 mg by mouth daily. 10/04/16  Yes [provider]  diclofenac sodium (VOLTAREN) 1 % GEL Apply 2 g topically 4 (four) times daily. Apply to right shoulder. Patient taking differently: Apply 2 g topically 4 (four) times daily as needed (for right shoulder pain).  08/25/16   Arrien, Jimmy Picket, MD  HYDROcodone-acetaminophen (NORCO/VICODIN) 5-325 MG tablet Take 1-2 tablets by mouth every 4 (four) hours as needed for severe pain. Patient not taking: Reported on 10/29/2016 10/16/16   Tanda Rockers, MD  senna (SENOKOT) 8.6 MG TABS tablet Take 1 tablet (8.6 mg total) by mouth daily as needed for mild constipation. 10/09/16   Damita Lack, MD     Family History  Problem Relation Age of Onset  . Hypertension Mother   . Diabetes Mother   . Diabetes Father   . Colon cancer Neg Hx   . Rectal cancer Neg Hx   . Esophageal cancer Neg Hx     Social History   Social History  . Marital status: Legally Separated    Spouse name: N/A  . Number of children: 0  . Years of education: N/A   Occupational History  . disabled    Social History Main Topics  . Smoking  status: Current Every Day Smoker    Packs/day: 1.00    Years: 47.00    Types: Cigarettes  . Smokeless tobacco: Never Used  . Alcohol use Yes     Comment: 10/07/2016 "nothing in 7 months; heavy drinker before that"  . Drug use: No  . Sexual activity: Not Currently   Other Topics Concern  . None   Social History Narrative  . None    Review of Systems  Constitutional: Negative for fatigue and fever.  Respiratory: Negative for cough and shortness of breath.   Cardiovascular: Negative for chest pain.  Psychiatric/Behavioral: Negative for behavioral problems and confusion.    Vital Signs: BP 136/75 (BP Location: Right Arm)   Pulse 92   Temp 98.7 F (37.1 C) (Oral)   Resp 18   Ht 5\' 6"  (1.676 m)   Wt 135 lb 12.9 oz (61.6 kg)   SpO2 100%   BMI 21.92 kg/m   Physical Exam  Constitutional: She is oriented to person,  place, and time. She appears well-developed.  Cardiovascular: Normal rate, regular rhythm and normal heart sounds.   Pulmonary/Chest: Effort normal. No respiratory distress. She has wheezes (diffuse).  Neurological: She is alert and oriented to person, place, and time.  Skin: Skin is warm and dry.  Psychiatric: She has a normal mood and affect. Her behavior is normal. Judgment and thought content normal.  Nursing note and vitals reviewed.   Mallampati Score:  MD Evaluation Airway: WNL Heart: WNL Abdomen: WNL Chest/ Lungs: WNL ASA  Classification: 3 Mallampati/Airway Score: Two  Imaging: Dg Chest 2 View  Result Date: 11/02/2016 CLINICAL DATA:  Chronic central chest pain, worsened EXAM: CHEST  2 VIEW COMPARISON:  Head CT 10/30/2016, radiograph 10/04/2016, CT 10/09/2016 FINDINGS: No acute pulmonary infiltrate, consolidation, or pleural effusion is seen. Surgical clips over the left chest. Post mastectomy changes on the left. Stable cardiomediastinal silhouette. Stable compression deformities of the thoracic and lumbar spine. Partially visualized left proximal  humerus fracture. Possible healing left tenth rib fracture. Right tenth rib fracture. IMPRESSION: 1. No acute infiltrate or edema. 2. Stable appearance of spinal compression deformities. Acute right tenth rib fracture. Possible healing left tenth rib fracture. Electronically Signed   By: Donavan Foil M.D.   On: 11/02/2016 15:28   Dg Ribs Unilateral W/chest Left  Result Date: 10/04/2016 CLINICAL DATA:  Left below diaphragm anterior to lateral rib pain x 1 month with some SOB.(Pt. [place] in January 2018 and injured left humerus.) No new fall, says patient. EXAM: LEFT RIBS AND CHEST - 3+ VIEW COMPARISON:  None. FINDINGS: No rib fracture or rib lesion. Heart, mediastinum and hila are unremarkable. Lungs are clear. No pleural effusion or pneumothorax. IMPRESSION: Negative. Electronically Signed   By: Lajean Manes M.D.   On: 10/04/2016 16:01   Dg Abd 1 View  Result Date: 10/07/2016 CLINICAL DATA:  Constipation.  Left-sided abdominal pain.  Nausea. EXAM: ABDOMEN - 1 VIEW COMPARISON:  CT scan dated 10/01/2016 FINDINGS: The bowel gas pattern is normal. No radio-opaque calculi or other significant radiographic abnormality are seen. IMPRESSION: Benign-appearing abdomen and pelvis. No excessive stool in the colon. No fecal impaction. Electronically Signed   By: Lorriane Shire M.D.   On: 10/07/2016 14:09   Ct Chest W Contrast  Result Date: 10/09/2016 CLINICAL DATA:  Chronic cough, smoker, hypertension, history LEFT breast cancer, alcohol abuse, GERD, lytic lesions within the lumbar spine on recent CT abdomen and pelvis exam, question lung cancer due to smoking history EXAM: CT CHEST WITH CONTRAST TECHNIQUE: Multidetector CT imaging of the chest was performed during intravenous contrast administration. Sagittal and coronal MPR images reconstructed from axial data set. CONTRAST:  82mL ISOVUE-300 IOPAMIDOL (ISOVUE-300) INJECTION 61% IV COMPARISON:  None ; correlation chest radiograph 10/04/2016 FINDINGS: Cardiovascular:  Atherosclerotic calcifications aorta and minimally in the coronary arteries. Aorta normal caliber. Aberrant origin of the RIGHT subclavian artery arising last from aortic arch and coursing posterior to the esophagus. Thoracic vascular structures grossly patent on nondedicated exam. No pericardial effusion. Mediastinum/Nodes: Esophagus unremarkable. Base of cervical region normal appearance. LEFT breast prosthesis. No thoracic adenopathy. Lungs/Pleura: Minimal RIGHT pleural effusion and dependent atelectasis of the posterior RIGHT lower lobe. Medial RIGHT lower lobe nodule 10 x 9 mm image 60, minimally irregular. Questionable tiny RIGHT upper lobe nodules image 19. No additional mass, nodule, infiltrate or pneumothorax. Upper Abdomen: Small cyst upper pole LEFT kidney. Remaining visualized upper abdomen unremarkable. Musculoskeletal: Bones appear demineralized. Compression fractures at T3, superior endplate T5, T8, and V42. Potential  underlying lytic lesions at T3 and T8. IMPRESSION: Multiple thoracic spine compression fractures with question associated lytic lesions/pathologic fractures at T3 and T8. 10 x 9 mm slightly irregular medial RIGHT lower lobe nodule, could represent a primary or metastatic lesion. Tiny nonspecific RIGHT upper lobe nodular foci. Aortic Atherosclerosis (ICD10-I70.0). Electronically Signed   By: Lavonia Dana M.D.   On: 10/09/2016 15:04   Ct Angio Chest Pe W And/or Wo Contrast  Result Date: 11/02/2016 CLINICAL DATA:  66 year old female with chronic chest wall pain and lung cancer EXAM: CT ANGIOGRAPHY CHEST WITH CONTRAST TECHNIQUE: Multidetector CT imaging of the chest was performed using the standard protocol during bolus administration of intravenous contrast. Multiplanar CT image reconstructions and MIPs were obtained to evaluate the vascular anatomy. CONTRAST:  100 mL Isovue 370 COMPARISON:  Recent prior imaging including PET-CT 11/02/2016 ; prior chest CT 10/09/2016 FINDINGS:  Cardiovascular: Aberrant right subclavian artery. No evidence of aortic aneurysm. Adequate opacification of the pulmonary arteries to the segmental level. Positive for a small filling defects within the postero basal segmental right lower lobe pulmonary artery. No additional pulmonary emboli are identified. The heart is normal in size. No pericardial effusion. Mediastinum/Nodes: Unremarkable CT appearance of the thyroid gland. No suspicious mediastinal or hilar adenopathy. No soft tissue mediastinal mass. The thoracic esophagus is unremarkable. Lungs/Pleura: 1 cm peripherally spiculated nodule in the right lower lobe. Mild dependent atelectasis in the right lower lobe. No new focal airspace consolidation, pleural effusion, pneumothorax or pulmonary edema. Upper Abdomen: The visualized portion of the upper abdomen is unremarkable. Musculoskeletal: Surgical changes of prior left mastectomy and breast reconstruction. Stable T3 compression fracture. Compression fracture involving the inferior endplate of T8 is new compared to 10/09/2016. Nondisplaced pathologic fracture through the left aspect of the manubrium again identified. Incompletely imaged right tenth rib fracture. Known multifocal osseous metastatic disease is better demonstrated on the recent prior PET-CT. Review of the MIP images confirms the above findings. IMPRESSION: 1. Positive for isolated segmental pulmonary embolus in the right lower lobe postero basal segmental pulmonary artery. The solitary small pulmonary embolus may or may not be contributing to the patient's described clinical symptoms. 2. New T8 compression fracture compared to the CT scan of the chest dated 10/09/2016. 3. Stable T3 compression fracture, right tenth rib a pathologic fracture and manubrial pathologic fracture. 4. Multifocal osseous metastatic disease better demonstrated on recent PET-CT. 5. Additional ancillary findings as above without interval change over the last 3 days.  Electronically Signed   By: Jacqulynn Cadet M.D.   On: 11/02/2016 20:46   Nm Pet Image Initial (pi) Skull Base To Thigh  Result Date: 10/30/2016 CLINICAL DATA:  Initial treatment strategy for right lower lobe lung nodule. Remote history of breast cancer. Findings of osseous metastatic disease. EXAM: NUCLEAR MEDICINE PET SKULL BASE TO THIGH TECHNIQUE: 6.4 mCi F-18 FDG was injected intravenously. Full-ring PET imaging was performed from the skull base to thigh after the radiotracer. CT data was obtained and used for attenuation correction and anatomic localization. FASTING BLOOD GLUCOSE:  Value: 119 mg/dl COMPARISON:  Chest CT from 10/09/2016 FINDINGS: NECK Oval-shaped subcutaneous lesion just below the right submandibular gland measures 1.7 by 1.6 cm on image 34/4 and has no accentuated metabolic activity. CHEST Focal interstitial accentuation with localized air cyst in the right upper lobe, image 20/7, maximum SUV 1.6. The medial right lower lobe lesion measures 9 mm in diameter on image 29/7 and has maximum standard uptake value of 3.3. There is some faint peripheral  ground-glass opacity in the superior segment right lower lobe posteriorly with very low-grade accentuated activity. Probably inflammatory. Aberrant right subclavian artery passes behind the esophagus. Aortic atherosclerotic calcification. ABDOMEN/PELVIS Hypermetabolic right adrenal mass 1.6 by 2.7 cm on image 97/4, maximum SUV 10.6. Focal calcifications in the pancreatic head, similar to 10/01/16 but increased from 05/23/2012. Maximum SUV in this region is 3.4 compared to typical pancreatic parenchymal activity of 2.1. Aortoiliac atherosclerotic vascular disease. SKELETON Widespread osseous metastatic disease. Index lesion involving the left glenoid and coracoid has a maximum SUV of 10.6. Index lesion in the anterior wall of the right acetabulum 12.5. Focal activity in the left pectoralis major muscle, maximum SUV 5.2, compatible with small  metastatic deposit or possibly physiologic activity. Likely pathologic fracture the left proximal humerus. The most part the lesions are either lytic or indistinct on the CT data. Pathologic fracture of the right tenth rib. Suspected pathologic fracture of the left upper sternal manubrium. IMPRESSION: 1. Widespread hypermetabolic and lytic osseous metastatic disease, including pathologic fractures the left proximal humerus, right tenth rib, and left upper manubrium. 2. The right lower lobe nodule has a maximum standard uptake value of 3.3, favoring malignancy. 3. Hypermetabolic right adrenal mass, compatible with metastatic disease. 4. Subtle accentuated activity in the pancreatic head adjacent to chronic calcifications in this region. This could reflect low grade inflammation in setting of chronic calcific pancreatitis. Calcified pancreatic mass is considered less likely given the somewhat chronic appearance. 5. Questionable small metastatic focus in the left pectoralis major muscle. 6. Aortoiliac atherosclerotic vascular disease. Aberrant right subclavian artery passes behind the esophagus. Electronically Signed   By: Van Clines M.D.   On: 10/30/2016 13:35    Labs:  CBC:  Recent Labs  10/07/16 1002 10/08/16 0352 11/02/16 1433 11/03/16 0433  WBC 10.9* 10.9* 15.4* 10.8*  HGB 11.9* 11.5* 10.1* 9.1*  HCT 35.2* 34.6* 29.8* 27.5*  PLT 467* 447* 491* 378    COAGS:  Recent Labs  05/22/16 1430 08/24/16 0950 10/07/16 1002  INR 1.45 0.96 1.04    BMP:  Recent Labs  10/08/16 0352 10/08/16 1529 11/02/16 1745 11/03/16 0433  NA 141 142 138 140  K 2.7* 3.4* 3.0* 3.9  CL 109 113* 99* 104  CO2 22 22 25 26   GLUCOSE 96 92 112* 106*  BUN 6 5* 7 7  CALCIUM 9.1 9.0 10.1 9.5  CREATININE 0.55 0.57 0.61 0.51  GFRNONAA >60 >60 >60 >60  GFRAA >60 >60 >60 >60    LIVER FUNCTION TESTS:  Recent Labs  10/04/16 1456 10/07/16 1002 10/08/16 0352 11/02/16 1745  BILITOT 0.6 1.5* 0.9 1.2    AST 17 19 12* 16  ALT 13* 8* 13* 12*  ALKPHOS 139* 140* 126 143*  PROT 7.3 6.6 6.4* 7.6  ALBUMIN 3.9 3.5 3.3* 4.1    TUMOR MARKERS: No results for input(s): AFPTM, CEA, CA199, CHROMGRNA in the last 8760 hours.  Assessment and Plan: History of breast cancer, new pulmonary nodule, lytic bone lesions, adrenal mass Patient with past medical history of breast cancer presents with recent complaint of back pain.  She is found to have multiple lytic lesions, adrenal mass, and pulmonary nodule. IR consulted for biopsy at the request of Dr. Melvyn Novas.  Adrenal mass was thought to be approachable, however patient now admitted with pulmonary embolus requiring blood thinners.  Case reviewed by Dr. Anselm Pancoast who approves patient for pubic bone lytic lesion biopsy.  Patient has been NPO as of 8AM. Her heparin has been held  this afternoon.  Anticipate procedure today.  Risks and benefits discussed with the patient including, but not limited to bleeding, infection, damage to adjacent structures or low yield requiring additional tests. All of the patient's questions were answered, patient is agreeable to proceed. Consent signed and in chart.  Thank you for this interesting consult.  I greatly enjoyed meeting Emily Cohen and look forward to participating in their care.  A copy of this report was sent to the requesting provider on this date.  Electronically Signed: Docia Barrier, PA 11/03/2016, 1:52 PM   I spent a total of 40 Minutes    in face to face in clinical consultation, greater than 50% of which was counseling/coordinating care for lytic bone lesion.

## 2016-11-03 NOTE — Procedures (Signed)
CT guided biopsy of right pubic bone lesion.  FNAs obtained from the lytic lesion.  Minimal blood loss and no immediate complication.

## 2016-11-03 NOTE — Telephone Encounter (Signed)
Spoke with the pt's spouse  He is calling to discuss getting bx set up  I advised he should talk with the hospitalist regarding this since she is currently inpatient  He verbalized understanding

## 2016-11-03 NOTE — Sedation Documentation (Signed)
Patient is resting comfortably. 

## 2016-11-03 NOTE — Progress Notes (Signed)
PHARMACY - HEPARIN (brief note)  IV Heparin has been on hold since 12:44 in anticipation of bone biopsy today.  Patient s/p bone biopsy (post-procedure note filed @ 16:41 on 11/03/16).    RN order noted that IV heparin to be restarted 2 hr after biopsy.  Plan: Resumed IV heparin at previous rate of 1200 units/hr @ 19:00 today Check heparin level 6 hr after heparin restarted Follow heparin level and CBC daily  Leone Haven, PharmD 11/03/16 @ 17:56

## 2016-11-03 NOTE — Sedation Documentation (Signed)
Vital signs stable. 

## 2016-11-03 NOTE — Progress Notes (Addendum)
[facility] PROGRESS NOTE    Progress Note  Emily Cohen  HDQ:[id number] DOB: 05-Mar-1951 DOA: 11/02/2016 PCP: Benito Mccreedy, MD     Brief Narrative:   Emily Cohen is an 66 y.o. female past medical history significant for smoking alcohol abuse gastric ulcer in February.is here by EGD last month diagnosed with metastatic cancer of unknown primary on July 20 presents to the ear with a week's history of chest pain and back pain which has progressively gotten worse CT images show acute PE.  Assessment/Plan:   Acute pulmonary embolism ([place]): Likely due to malignancy, she is saturating 100% on room air not tachycardic. We will discontinue IV heparin and start her on Lovenox was discussed probably due to malignancy. As an outpatient to be changed to DOAC's. Avoid NSAIDs. Leukocytosis is now resolved.  Essential hypertension Blood pressure control continue clonidine Norvasc.  History of gastric ulcer: Avoid NSAIDs start Protonix. Check CBC in am to monitor Hbg  Metastatic cancer to bone [facility]) Patient had Ct guided Biopsy order by primary pulmonary, physician Dr. Melvyn Novas. To be done while in house.  Microcytic hypochromic anemia: Mild drop in hemoglobin, no signs of overt bleeding. FOBT stools. Continue to follow CBC.  DVT prophylaxis: Heparin Family Communication:none Disposition Plan/Barrier to D/C: unable to determine Code Status:     Code Status Orders        Start     Ordered   11/02/16 2354  Full code  Continuous     11/02/16 2356    Code Status History    Date Active Date Inactive Code Status Order ID Comments User Context   10/07/2016  6:10 PM 10/09/2016  9:30 PM Full Code [id number]  Samella Parr, NP Inpatient   08/24/2016  4:29 PM 08/25/2016  6:35 PM Full Code [id number]  Tawni Millers, MD Inpatient   05/22/2016  5:00 PM 05/26/2016  6:02 PM Full Code [id number]  Chesley Mires, MD ED        IV Access:    Peripheral  IV   Procedures and diagnostic studies:   Dg Chest 2 View  Result Date: 11/02/2016 CLINICAL DATA:  Chronic central chest pain, worsened EXAM: CHEST  2 VIEW COMPARISON:  Head CT 10/30/2016, radiograph 10/04/2016, CT 10/09/2016 FINDINGS: No acute pulmonary infiltrate, consolidation, or pleural effusion is seen. Surgical clips over the left chest. Post mastectomy changes on the left. Stable cardiomediastinal silhouette. Stable compression deformities of the thoracic and lumbar spine. Partially visualized left proximal humerus fracture. Possible healing left tenth rib fracture. Right tenth rib fracture. IMPRESSION: 1. No acute infiltrate or edema. 2. Stable appearance of spinal compression deformities. Acute right tenth rib fracture. Possible healing left tenth rib fracture. Electronically Signed   By: Donavan Foil M.D.   On: 11/02/2016 15:28   Ct Angio Chest Pe W And/or Wo Contrast  Result Date: 11/02/2016 CLINICAL DATA:  66 year old female with chronic chest wall pain and lung cancer EXAM: CT ANGIOGRAPHY CHEST WITH CONTRAST TECHNIQUE: Multidetector CT imaging of the chest was performed using the standard protocol during bolus administration of intravenous contrast. Multiplanar CT image reconstructions and MIPs were obtained to evaluate the vascular anatomy. CONTRAST:  100 mL Isovue 370 COMPARISON:  Recent prior imaging including PET-CT 11/02/2016 ; prior chest CT 10/09/2016 FINDINGS: Cardiovascular: Aberrant right subclavian artery. No evidence of aortic aneurysm. Adequate opacification of the pulmonary arteries to the segmental level. Positive for a small filling defects within the postero basal segmental right lower lobe pulmonary artery. No  additional pulmonary emboli are identified. The heart is normal in size. No pericardial effusion. Mediastinum/Nodes: Unremarkable CT appearance of the thyroid gland. No suspicious mediastinal or hilar adenopathy. No soft tissue mediastinal mass. The thoracic  esophagus is unremarkable. Lungs/Pleura: 1 cm peripherally spiculated nodule in the right lower lobe. Mild dependent atelectasis in the right lower lobe. No new focal airspace consolidation, pleural effusion, pneumothorax or pulmonary edema. Upper Abdomen: The visualized portion of the upper abdomen is unremarkable. Musculoskeletal: Surgical changes of prior left mastectomy and breast reconstruction. Stable T3 compression fracture. Compression fracture involving the inferior endplate of T8 is new compared to 10/09/2016. Nondisplaced pathologic fracture through the left aspect of the manubrium again identified. Incompletely imaged right tenth rib fracture. Known multifocal osseous metastatic disease is better demonstrated on the recent prior PET-CT. Review of the MIP images confirms the above findings. IMPRESSION: 1. Positive for isolated segmental pulmonary embolus in the right lower lobe postero basal segmental pulmonary artery. The solitary small pulmonary embolus may or may not be contributing to the patient's described clinical symptoms. 2. New T8 compression fracture compared to the CT scan of the chest dated 10/09/2016. 3. Stable T3 compression fracture, right tenth rib a pathologic fracture and manubrial pathologic fracture. 4. Multifocal osseous metastatic disease better demonstrated on recent PET-CT. 5. Additional ancillary findings as above without interval change over the last 3 days. Electronically Signed   By: Jacqulynn Cadet M.D.   On: 11/02/2016 20:46     Medical Consultants:    None.  Anti-Infectives:   None  Subjective:    Emily Cohen she relates she feels better today pain is not controlled.  Objective:    Vitals:   11/03/16 0030 11/03/16 0100 11/03/16 0138 11/03/16 0700  BP: 123/72 139/89 129/78 136/75  Pulse: 84 84 90 92  Resp: 13 15 18 18   Temp:   98.9 F (37.2 C) 98.7 F (37.1 C)  TempSrc:   Oral Oral  SpO2: 95% 98% 99% 100%  Weight:   61.6 kg (135 lb  12.9 oz)   Height:   5\' 6"  (1.676 m)     Intake/Output Summary (Last 24 hours) at 11/03/16 0718 Last data filed at 11/03/16 0410  Gross per 24 hour  Intake           272.17 ml  Output                0 ml  Net           272.17 ml   Filed Weights   11/03/16 0138  Weight: 61.6 kg (135 lb 12.9 oz)    Exam: General exam: In no acute distress. Respiratory system: Good air movement clear to auscultation. Cardiovascular system: Regular in rhythm with positive S1-S2 no murmurs rubs gallops Gastrointestinal system: Abdomen soft nontender nondistended. Extremities: No edema. Skin: No rashes.  Psychiatry:  judgment and insight appear normal.    Data Reviewed:    Labs: Basic Metabolic Panel:  Recent Labs Lab 11/02/16 1745 11/03/16 0433  NA 138 140  K 3.0* 3.9  CL 99* 104  CO2 25 26  GLUCOSE 112* 106*  BUN 7 7  CREATININE 0.61 0.51  CALCIUM 10.1 9.5   GFR Estimated Creatinine Clearance: 65.6 mL/min (by C-G formula based on SCr of 0.51 mg/dL). Liver Function Tests:  Recent Labs Lab 11/02/16 1745  AST 16  ALT 12*  ALKPHOS 143*  BILITOT 1.2  PROT 7.6  ALBUMIN 4.1   No results for input(s): LIPASE,  AMYLASE in the last 168 hours. No results for input(s): AMMONIA in the last 168 hours. Coagulation profile No results for input(s): INR, PROTIME in the last 168 hours.  CBC:  Recent Labs Lab 11/02/16 1433 11/03/16 0433  WBC 15.4* 10.8*  HGB 10.1* 9.1*  HCT 29.8* 27.5*  MCV 75.1* 77.2*  PLT 491* 378   Cardiac Enzymes: No results for input(s): CKTOTAL, CKMB, CKMBINDEX, TROPONINI in the last 168 hours. BNP (last 3 results) No results for input(s): PROBNP in the last 8760 hours. CBG:  Recent Labs Lab 10/30/16 1039  GLUCAP 119*   D-Dimer:  Recent Labs  11/02/16 1745  DDIMER >20.00*   Hgb A1c: No results for input(s): HGBA1C in the last 72 hours. Lipid Profile: No results for input(s): CHOL, HDL, LDLCALC, TRIG, CHOLHDL, LDLDIRECT in the last 72  hours. Thyroid function studies: No results for input(s): TSH, T4TOTAL, T3FREE, THYROIDAB in the last 72 hours.  Invalid input(s): FREET3 Anemia work up: No results for input(s): VITAMINB12, FOLATE, FERRITIN, TIBC, IRON, RETICCTPCT in the last 72 hours. Sepsis Labs:  Recent Labs Lab 11/02/16 1433 11/03/16 0433  WBC 15.4* 10.8*   Microbiology No results found for this or any previous visit (from the past 240 hour(s)).   Medications:   . amLODipine  10 mg Oral Daily  . cloNIDine  0.1 mg Oral TID  . feeding supplement (ENSURE ENLIVE)  237 mL Oral BID BM  . iopamidol      . nicotine  21 mg Transdermal Daily  . pantoprazole  40 mg Oral BID  . polyethylene glycol powder  1 Container Oral Daily  . sucralfate  1 g Oral TID AC & HS  . venlafaxine XR  75 mg Oral Daily   Continuous Infusions: . heparin 1,000 Units/hr (11/03/16 0057)      LOS: 1 day   Charlynne Cousins  [facility] Pager [phone]  *Please refer to [email or web address], password [email or web address] to get updated schedule on who will round on this patient, as hospitalists switch teams weekly. If 7PM-7AM, please contact night-coverage at [email or web address], password [email or web address] for any overnight needs.  11/03/2016, 7:18 AM

## 2016-11-03 NOTE — Progress Notes (Signed)
ANTICOAGULATION CONSULT NOTE  Pharmacy Consult for Heparin Indication: pulmonary embolus  No Known Allergies  Patient Measurements: Height: 5\' 6"  (167.6 cm) Weight: 135 lb 12.9 oz (61.6 kg) IBW/kg (Calculated) : 59.3 Heparin Dosing Weight: 61.2 kg  Vital Signs: Temp: 98.7 F (37.1 C) (07/24 0700) Temp Source: Oral (07/24 0700) BP: 136/75 (07/24 0700) Pulse Rate: 92 (07/24 0700)  Labs:  Recent Labs  11/02/16 1433 11/02/16 1745 11/03/16 0433  HGB 10.1*  --  9.1*  HCT 29.8*  --  27.5*  PLT 491*  --  378  CREATININE  --  0.61 0.51    Estimated Creatinine Clearance: 65.6 mL/min (by C-G formula based on SCr of 0.51 mg/dL).   Medical History: Past Medical History:  Diagnosis Date  . Alcohol abuse 2014  . Anxiety   . Arthritis    "maybe in my legs" (10/07/2016)  . Breast cancer, left breast ([place]) <2002  . Chronic back pain   . Depression   . Gastric ulcer 05/2016   caused hematemesis.  GU with VV cauterized at EGD.  using NSAIDs.  biopsy negative for H Pylori.    Marland Kitchen GERD (gastroesophageal reflux disease)   . Hypertension   . Left humeral fracture 01/2016   did not follow up with ortho as planned  . Pancreatitis 2014   likely due to ETOH  . Pre-diabetes    Assessment: 66 yo F with metastatic lung cancer and new PE.  Pharmacy consulted to dose heparin.  Using heparin so IR can perform biopsy, then plan transition to [place] for long-term anticoagulation.  11/03/2016:  Heparin level <0.1  Hg trending down (10.1-->9.1), pltc WNL.  Iron studies pending.   No acute bleeding or infusion related issues noted.  IR consult for bx pending  Goal of Therapy:   Heparin level 0.3 - 0.7 Monitor platelets by anticoagulation protocol: Yes   Plan:  Re-bolus heparin 2000 units/hr Increase Heparin infusion to 1200 units/hr  Repeat 6h heparin level after rate change Daily heparin level and CBC while on heparin Transition to [facility] after biopsy  Netta Cedars, PharmD,  BCPS Pager: [phone] 11/03/2016 7:14 AM

## 2016-11-03 NOTE — Sedation Documentation (Signed)
Patient is resting comfortably. 

## 2016-11-04 DIAGNOSIS — M8448XA Pathological fracture, other site, initial encounter for fracture: Secondary | ICD-10-CM

## 2016-11-04 DIAGNOSIS — C799 Secondary malignant neoplasm of unspecified site: Secondary | ICD-10-CM

## 2016-11-04 DIAGNOSIS — I10 Essential (primary) hypertension: Secondary | ICD-10-CM

## 2016-11-04 DIAGNOSIS — D509 Iron deficiency anemia, unspecified: Secondary | ICD-10-CM

## 2016-11-04 DIAGNOSIS — I2699 Other pulmonary embolism without acute cor pulmonale: Principal | ICD-10-CM

## 2016-11-04 DIAGNOSIS — E440 Moderate protein-calorie malnutrition: Secondary | ICD-10-CM | POA: Insufficient documentation

## 2016-11-04 DIAGNOSIS — Z8719 Personal history of other diseases of the digestive system: Secondary | ICD-10-CM

## 2016-11-04 LAB — CBC
HCT: 26 % — ABNORMAL LOW (ref 36.0–46.0)
Hemoglobin: 8.5 g/dL — ABNORMAL LOW (ref 12.0–15.0)
MCH: 25.3 pg — ABNORMAL LOW (ref 26.0–34.0)
MCHC: 32.7 g/dL (ref 30.0–36.0)
MCV: 77.4 fL — ABNORMAL LOW (ref 78.0–100.0)
PLATELETS: 390 10*3/uL (ref 150–400)
RBC: 3.36 MIL/uL — AB (ref 3.87–5.11)
RDW: 21 % — ABNORMAL HIGH (ref 11.5–15.5)
WBC: 9.5 10*3/uL (ref 4.0–10.5)

## 2016-11-04 LAB — HEPARIN LEVEL (UNFRACTIONATED)
HEPARIN UNFRACTIONATED: 0.96 [IU]/mL — AB (ref 0.30–0.70)
Heparin Unfractionated: 0.75 IU/mL — ABNORMAL HIGH (ref 0.30–0.70)

## 2016-11-04 LAB — FOLATE: FOLATE: 10.6 ng/mL (ref >5.9)

## 2016-11-04 LAB — VITAMIN B12: Vitamin B-12: 881 pg/mL (ref 180–914)

## 2016-11-04 LAB — RETICULOCYTES
RBC.: 3.47 MIL/uL — ABNORMAL LOW (ref 3.87–5.11)
RETIC CT PCT: 4.3 % — AB (ref 0.4–3.1)
Retic Count, Absolute: 149.2 10*3/uL (ref 19.0–186.0)

## 2016-11-04 MED ORDER — POLYETHYLENE GLYCOL 3350 17 G PO PACK
17.0000 g | PACK | Freq: Two times a day (BID) | ORAL | Status: DC
  Administered 2016-11-04 – 2016-11-07 (×6): 17 g via ORAL
  Filled 2016-11-04 (×6): qty 1

## 2016-11-04 MED ORDER — MORPHINE SULFATE ER 15 MG PO TBCR
15.0000 mg | EXTENDED_RELEASE_TABLET | Freq: Two times a day (BID) | ORAL | Status: DC
  Administered 2016-11-04 – 2016-11-05 (×3): 15 mg via ORAL
  Filled 2016-11-04 (×3): qty 1

## 2016-11-04 MED ORDER — MORPHINE SULFATE 15 MG PO TABS
15.0000 mg | ORAL_TABLET | ORAL | Status: DC | PRN
  Administered 2016-11-04 – 2016-11-05 (×3): 15 mg via ORAL
  Filled 2016-11-04 (×3): qty 1

## 2016-11-04 MED ORDER — HEPARIN (PORCINE) IN NACL 100-0.45 UNIT/ML-% IJ SOLN: 1100.0000 [IU]/h | INTRAMUSCULAR | Status: DC

## 2016-11-04 MED ORDER — ENOXAPARIN (LOVENOX) PATIENT EDUCATION KIT
PACK | Freq: Once | Status: AC
  Administered 2016-11-04: 22:00:00
  Filled 2016-11-04: qty 1

## 2016-11-04 MED ORDER — ENOXAPARIN SODIUM 60 MG/0.6ML ~~LOC~~ SOLN
60.0000 mg | Freq: Two times a day (BID) | SUBCUTANEOUS | Status: DC
  Administered 2016-11-04 – 2016-11-07 (×7): 60 mg via SUBCUTANEOUS
  Filled 2016-11-04 (×7): qty 0.6

## 2016-11-04 MED ORDER — OXYCODONE HCL 5 MG PO TABS
5.0000 mg | ORAL_TABLET | ORAL | Status: DC | PRN
  Administered 2016-11-04: 5 mg via ORAL
  Filled 2016-11-04: qty 1

## 2016-11-04 NOTE — Care Management Note (Signed)
Case Management Note  Patient Details  Name: Laron Angelini MRN: [id number] Date of Birth: 1951/02/28  Subjective/Objective: 66 y/o f admitted w/Acute PE. From home. Checked benefit for lovenox/enoxaparin-Patient has [place] medicaid-co pay $3-17. No further CM needs.                  Action/Plan:d/c plan home.   Expected Discharge Date:                  Expected Discharge Plan:  Home/Self Care  In-House Referral:     Discharge planning Services  CM Consult  Post Acute Care Choice:    Choice offered to:     DME Arranged:    DME Agency:     HH Arranged:    HH Agency:     Status of Service:  In process, will continue to follow  If discussed at Long Length of Stay Meetings, dates discussed:    Additional Comments:  Dessa Phi, RN 11/04/2016, 2:21 PM

## 2016-11-04 NOTE — Progress Notes (Signed)
ANTICOAGULATION CONSULT NOTE  Pharmacy Consult for Heparin Indication: pulmonary embolus  No Known Allergies  Patient Measurements: Height: 5\' 6"  (167.6 cm) Weight: 135 lb 12.9 oz (61.6 kg) IBW/kg (Calculated) : 59.3 Heparin Dosing Weight: 61.2 kg  Vital Signs: Temp: 99 F (37.2 C) (07/24 2016) Temp Source: Oral (07/24 2016) BP: 101/78 (07/24 2016) Pulse Rate: 93 (07/24 2016)  Labs:  Recent Labs  11/02/16 1433 11/02/16 1745 11/03/16 0433 11/03/16 0647 11/04/16 0101  HGB 10.1*  --  9.1*  --  8.5*  HCT 29.8*  --  27.5*  --  26.0*  PLT 491*  --  378  --  390  HEPARINUNFRC  --   --   --  <0.10* 0.75*  CREATININE  --  0.61 0.51  --   --     Estimated Creatinine Clearance: 65.6 mL/min (by C-G formula based on SCr of 0.51 mg/dL).   Assessment: 66 yo F with metastatic lung cancer and new PE.  Pharmacy consulted to dose heparin.  Using heparin so IR can perform biopsy, then plan transition to [place] for long-term anticoagulation.  11/04/2016: Patient s/p bone biopsy.  Heparin was resumed 2 hrs after bx at previous rate of 1200 units/hr.  Heparin level slightly elevated at 0.75.  Hg 8.5 - down from 9.1.  PLTC WNL, no bleeding reported.   Goal of Therapy:   Heparin level 0.3 - 0.7 Monitor platelets by anticoagulation protocol: Yes   Plan: decrease Heparin infusion to 1100 units/hr  Repeat 6h heparin level after rate change Daily heparin level and CBC while on heparin Transition to [facility] after biopsy  Eudelia Bunch, Pharm.D. [phone] 11/04/2016 1:51 AM

## 2016-11-04 NOTE — Progress Notes (Signed)
PROGRESS NOTE    Emily Cohen  XQJ:[id number] DOB: 08/09/1950 DOA: 11/02/2016 PCP: Benito Mccreedy, MD    Brief Narrative:  Emily Cohen is an 66 y.o. female past medical history significant for smoking alcohol abuse gastric ulcer in February is here by EGD last month diagnosed with metastatic cancer of unknown primary on July 20 presents to the ED with a week's history of chest pain and back pain which has progressively gotten worse CT images show acute PE.    Assessment & Plan:   Principal Problem:   Acute pulmonary embolism (HCC) Active Problems:   Essential hypertension   History of gastric ulcer   Metastatic cancer to bone (HCC)   Microcytic hypochromic anemia   Malnutrition of moderate degree  #1 acute pulmonary embolism Per CT angiogram chest. Likely secondary to malignancy. Check lower extremity Dopplers. Check a 2-D echo. Change IV heparin drip to full dose Lovenox. Avoid NSAIDs. Outpatient follow-up.  #2 hypertension  Blood pressure borderline. Discontinue Norvasc. Continue clonidine.  #3 history of gastric ulcer Stable. Avoid NSAIDs. Continue PPI. Follow H&H.  #4 metastatic cancer of unknown primary to bone Patient status post CT-guided biopsy of right pubic bone lesion. Interventional radiology, Dr. Anselm Pancoast 11/03/2016. Biopsy results pending. Patient with complaints of diffuse pain likely secondary to metastatic disease. Placed on MS Contin 15 mg twice a day. MSIR 15 mg every 4 hours when necessary breakthrough pain.  #5 anemia Likely anemia of chronic disease. Hemoglobin currently at 8.5. No overt bleeding. Follow H&H and transfuse for hemoglobin less than 7.  #6 hypokalemia Repleted.   DVT prophylaxis: Heparin drip>>>> Lovenox Code Status: Full Family Communication: No family at bedside. Disposition Plan: Pending PT evaluation home with home health versus skilled nursing facility when medically stable.   Consultants:   Interventional  radiology: Dr. Anselm Pancoast 11/03/2016  Procedures:   CT-guided biopsy of right pubic bone lesion per Dr. Anselm Pancoast 11/03/2016  CT angiogram chest 11/02/2016  Chest x-ray 11/02/2016  Antimicrobials:   None   Subjective: Patient laying in bed complaining of diffuse pain all over stating pain is not well controlled. Patient denies any chest pain. Patient states shortness of breath improved since admission.  Objective: Vitals:   11/03/16 1618 11/03/16 1749 11/03/16 2016 11/04/16 0354  BP: (!) 101/54 109/70 101/78 (!) 124/58  Pulse: 91 88 93 84  Resp: 19  20 19   Temp:   99 F (37.2 C) 99 F (37.2 C)  TempSrc:   Oral Oral  SpO2: 99% 97% 97% 99%  Weight:      Height:        Intake/Output Summary (Last 24 hours) at 11/04/16 1300 Last data filed at 11/04/16 1006  Gross per 24 hour  Intake           532.45 ml  Output              650 ml  Net          -117.55 ml   Filed Weights   11/03/16 0138  Weight: 61.6 kg (135 lb 12.9 oz)    Examination:  General exam: Appears calm and comfortable  Respiratory system: Clear to auscultation. Respiratory effort normal. Cardiovascular system: S1 & S2 heard, RRR. No JVD, murmurs, rubs, gallops or clicks. No pedal edema. Gastrointestinal system: Abdomen is nondistended, soft and nontender. No organomegaly or masses felt. Normal bowel sounds heard. Central nervous system: Alert and oriented. No focal neurological deficits. Extremities: Symmetric 5 x 5 power. Skin: No rashes,  lesions or ulcers Psychiatry: Judgement and insight appear normal. Mood & affect appropriate.     Data Reviewed: I have personally reviewed following labs and imaging studies  CBC:  Recent Labs Lab 11/02/16 1433 11/03/16 0433 11/04/16 0101  WBC 15.4* 10.8* 9.5  HGB 10.1* 9.1* 8.5*  HCT 29.8* 27.5* 26.0*  MCV 75.1* 77.2* 77.4*  PLT 491* 378 433   Basic Metabolic Panel:  Recent Labs Lab 11/02/16 1745 11/03/16 0433  NA 138 140  K 3.0* 3.9  CL 99* 104  CO2 25  26  GLUCOSE 112* 106*  BUN 7 7  CREATININE 0.61 0.51  CALCIUM 10.1 9.5   GFR: Estimated Creatinine Clearance: 65.6 mL/min (by C-G formula based on SCr of 0.51 mg/dL). Liver Function Tests:  Recent Labs Lab 11/02/16 1745  AST 16  ALT 12*  ALKPHOS 143*  BILITOT 1.2  PROT 7.6  ALBUMIN 4.1   No results for input(s): LIPASE, AMYLASE in the last 168 hours. No results for input(s): AMMONIA in the last 168 hours. Coagulation Profile: No results for input(s): INR, PROTIME in the last 168 hours. Cardiac Enzymes: No results for input(s): CKTOTAL, CKMB, CKMBINDEX, TROPONINI in the last 168 hours. BNP (last 3 results) No results for input(s): PROBNP in the last 8760 hours. HbA1C: No results for input(s): HGBA1C in the last 72 hours. CBG:  Recent Labs Lab 10/30/16 1039  GLUCAP 119*   Lipid Profile: No results for input(s): CHOL, HDL, LDLCALC, TRIG, CHOLHDL, LDLDIRECT in the last 72 hours. Thyroid Function Tests: No results for input(s): TSH, T4TOTAL, FREET4, T3FREE, THYROIDAB in the last 72 hours. Anemia Panel:  Recent Labs  11/03/16 0433 11/04/16 0804  VITAMINB12  --  881  FOLATE  --  10.6  FERRITIN 150  --   TIBC 266  --   IRON 63  --   RETICCTPCT  --  4.3*   Sepsis Labs: No results for input(s): PROCALCITON, LATICACIDVEN in the last 168 hours.  No results found for this or any previous visit (from the past 240 hour(s)).       Radiology Studies: Dg Chest 2 View  Result Date: 11/02/2016 CLINICAL DATA:  Chronic central chest pain, worsened EXAM: CHEST  2 VIEW COMPARISON:  Head CT 10/30/2016, radiograph 10/04/2016, CT 10/09/2016 FINDINGS: No acute pulmonary infiltrate, consolidation, or pleural effusion is seen. Surgical clips over the left chest. Post mastectomy changes on the left. Stable cardiomediastinal silhouette. Stable compression deformities of the thoracic and lumbar spine. Partially visualized left proximal humerus fracture. Possible healing left tenth  rib fracture. Right tenth rib fracture. IMPRESSION: 1. No acute infiltrate or edema. 2. Stable appearance of spinal compression deformities. Acute right tenth rib fracture. Possible healing left tenth rib fracture. Electronically Signed   By: Donavan Foil M.D.   On: 11/02/2016 15:28   Ct Angio Chest Pe W And/or Wo Contrast  Result Date: 11/02/2016 CLINICAL DATA:  66 year old female with chronic chest wall pain and lung cancer EXAM: CT ANGIOGRAPHY CHEST WITH CONTRAST TECHNIQUE: Multidetector CT imaging of the chest was performed using the standard protocol during bolus administration of intravenous contrast. Multiplanar CT image reconstructions and MIPs were obtained to evaluate the vascular anatomy. CONTRAST:  100 mL Isovue 370 COMPARISON:  Recent prior imaging including PET-CT 11/02/2016 ; prior chest CT 10/09/2016 FINDINGS: Cardiovascular: Aberrant right subclavian artery. No evidence of aortic aneurysm. Adequate opacification of the pulmonary arteries to the segmental level. Positive for a small filling defects within the postero basal segmental right lower  lobe pulmonary artery. No additional pulmonary emboli are identified. The heart is normal in size. No pericardial effusion. Mediastinum/Nodes: Unremarkable CT appearance of the thyroid gland. No suspicious mediastinal or hilar adenopathy. No soft tissue mediastinal mass. The thoracic esophagus is unremarkable. Lungs/Pleura: 1 cm peripherally spiculated nodule in the right lower lobe. Mild dependent atelectasis in the right lower lobe. No new focal airspace consolidation, pleural effusion, pneumothorax or pulmonary edema. Upper Abdomen: The visualized portion of the upper abdomen is unremarkable. Musculoskeletal: Surgical changes of prior left mastectomy and breast reconstruction. Stable T3 compression fracture. Compression fracture involving the inferior endplate of T8 is new compared to 10/09/2016. Nondisplaced pathologic fracture through the left aspect  of the manubrium again identified. Incompletely imaged right tenth rib fracture. Known multifocal osseous metastatic disease is better demonstrated on the recent prior PET-CT. Review of the MIP images confirms the above findings. IMPRESSION: 1. Positive for isolated segmental pulmonary embolus in the right lower lobe postero basal segmental pulmonary artery. The solitary small pulmonary embolus may or may not be contributing to the patient's described clinical symptoms. 2. New T8 compression fracture compared to the CT scan of the chest dated 10/09/2016. 3. Stable T3 compression fracture, right tenth rib a pathologic fracture and manubrial pathologic fracture. 4. Multifocal osseous metastatic disease better demonstrated on recent PET-CT. 5. Additional ancillary findings as above without interval change over the last 3 days. Electronically Signed   By: Jacqulynn Cadet M.D.   On: 11/02/2016 20:46   Ct Biopsy  Result Date: 11/03/2016 INDICATION: 66 year old with the diffuse metastatic bone disease and history of breast cancer. Patient also has a concerning right lung nodule and right adrenal lesion. Tissue diagnosis is needed. Recent diagnosis of pulmonary embolism. IV heparin was stopped prior to the procedure. EXAM: CT-GUIDED BIOPSY OF RIGHT PUBIC BONE LESION MEDICATIONS: None. ANESTHESIA/SEDATION: Moderate (conscious) sedation was employed during this procedure. A total of Versed 4.0 mg and Fentanyl 100 mcg was administered intravenously. Moderate Sedation Time: 18 minutes. The patient's level of consciousness and vital signs were monitored continuously by radiology nursing throughout the procedure under my direct supervision. FLUOROSCOPY TIME:  None COMPLICATIONS: None immediate. PROCEDURE: Informed written consent was obtained from the patient after a thorough discussion of the procedural risks, benefits and alternatives. All questions were addressed. A timeout was performed prior to the initiation of the  procedure. Patient was placed supine on the CT scanner. Images of the pelvis were obtained. The destructive and lucent lesion in the anterior right pubic bone was targeted. Anterior pelvis was prepped and draped in sterile fashion. Skin and soft tissues were anesthetized with 1% lidocaine. 72 gauge needle was directed into the anterior aspect of the lesion with CT guidance. A total of 4 fine-needle aspirations were performed with [place] and [place] needles. Small core samples were actually obtained from the aspirate needles along with aspirate material. Aspiration was also obtained from the 17 gauge needle as it was removed. Bandage placed over the puncture site. FINDINGS: Lucent lesion involving the anterior right pubic bone with some bone destruction along the anterior cortex. Biopsy needles were confirmed within the lucent lesion. No complicating features on the post biopsy imaging. IMPRESSION: CT-guided biopsy of the right pubic bone lesion. Electronically Signed   By: Markus Daft M.D.   On: 11/03/2016 17:11        Scheduled Meds: . amLODipine  10 mg Oral Daily  . cloNIDine  0.1 mg Oral TID  . enoxaparin (LOVENOX) injection  60 mg Subcutaneous  Q12H  . enoxaparin   Does not apply Once  . feeding supplement (ENSURE ENLIVE)  237 mL Oral BID BM  . nicotine  21 mg Transdermal Daily  . pantoprazole  40 mg Oral BID  . polyethylene glycol  17 g Oral Daily  . sucralfate  1 g Oral TID AC & HS  . venlafaxine XR  75 mg Oral Daily   Continuous Infusions:   LOS: 2 days    Time spent: 21 minutes    Noam Karaffa, MD [facility] Pager [phone]  If 7PM-7AM, please contact night-coverage [email or web address] Password [email or web address] 11/04/2016, 1:00 PM

## 2016-11-04 NOTE — Progress Notes (Signed)
[place] for Heparin-->Lovenox Indication: pulmonary embolus  No Known Allergies  Patient Measurements: Height: 5\' 6"  (167.6 cm) Weight: 135 lb 12.9 oz (61.6 kg) IBW/kg (Calculated) : 59.3 Heparin Dosing Weight: 61.2 kg  Vital Signs: Temp: 99 F (37.2 C) (07/25 0354) Temp Source: Oral (07/25 0354) BP: 124/58 (07/25 0354) Pulse Rate: 84 (07/25 0354)  Labs:  Recent Labs  11/02/16 1433 11/02/16 1745 11/03/16 0433 11/03/16 0647 11/04/16 0101 11/04/16 0804  HGB 10.1*  --  9.1*  --  8.5*  --   HCT 29.8*  --  27.5*  --  26.0*  --   PLT 491*  --  378  --  390  --   HEPARINUNFRC  --   --   --  <0.10* 0.75* 0.96*  CREATININE  --  0.61 0.51  --   --   --     Estimated Creatinine Clearance: 65.6 mL/min (by C-G formula based on SCr of 0.51 mg/dL).   Assessment: 66 yo F with metastatic lung cancer and new PE.  Pharmacy consulted to dose heparin.  Using heparin so IR can perform biopsy, then plan transition to [place] for long-term anticoagulation.  11/04/2016:  s/p bone biopsy   Hg 8.5 - down from 9.1.  PLTC WNL  no bleeding reported.    Goal of Therapy:   Heparin level 0.3 - 0.7 Monitor platelets by anticoagulation protocol: Yes   Plan: D/C heparin Start Lovenox 60mg  sq BID Check CBC at least every 3 days while inpatient Initiate Lovenox education for home  Netta Cedars, PharmD, BCPS Pager: [phone] 11/04/2016 9:21 AM

## 2016-11-04 NOTE — Progress Notes (Signed)
Initial Nutrition Assessment  DOCUMENTATION CODES:   Non-severe (moderate) malnutrition in context of acute illness/injury  INTERVENTION:  - Continue Ensure Enlive BID, each supplement provides 350 kcal and 20 grams of protein - Continue to encourage PO intakes of meals and supplements.  - RD will continue to monitor for nutrition-related needs.  NUTRITION DIAGNOSIS:   Malnutrition (moderate/non-severe) related to acute illness (s/p recent healing of gastric ulcer, metastatic disease with unknown primary dx on 7/20) as evidenced by percent weight loss, mild depletion of body fat, mild depletion of muscle mass.  GOAL:   Patient will meet greater than or equal to 90% of their needs  MONITOR:   PO intake, Supplement acceptance, Weight trends, Labs  REASON FOR ASSESSMENT:   Malnutrition Screening Tool  ASSESSMENT:   66 y.o. female with medical history significant of smoking, EtOH abuse in past (nothing for past 8 months), gastric ulcer in Feb that is healed on EGD last month, BRCA s/p mastectomy. Patient was diagnosed with metastatic cancer of unknown primary on PET-CT scan on 7/20. Presents to ED today with 8 week history of chest pain, back pain, cough; symptoms are severe.  Pt seen for MST. BMI indicates normal weight. Per chart review, pt consumed 75% of snack and 30% of breakfast yesterday and 100% of breakfast this AM. During discussion pt reports information and then states that she is confused. RD opened pt's chart on the computer in her room and based on discussion from information from previous admissions was able to elicit the following information.  Pt had a gastric ulcer which was causing pain with all food and drink intakes. No pain with swallowing. Following diagnosis of ulcer she was encouraged not to consume dairy items (gives the example of milk) or high fiber foods (gives the example of bread). Ulcer is now healed and since that time pt has had no pain when eating or  drinking. Notes indicate that ulcer has been healed for apprroximately 1 month. Pt has had a better appetite and improved intakes since ulcer healing.   Pt reports that she is no longer having abdominal pain but that she is having "all over" pain, specifically in extremities. She states that she was recently informed that she has bone cancer. Yesterday evening she underwent CT-guided biopsy of R pubic bone lesion.  Physical assessment shows mild fat wasting to upper arm and mild muscle wasting to lower leg. Pt is unsure of weight changes over the past few months. Per chart review, pt has lost 13 lbs (8.7% body weight) since 6/24; this is significant for 1 month time frame. Pt meets criteria for malnutrition based on wasting and weight loss. Ensure Enlive was ordered BID on admission per ONS protocol and pt accepted this supplement this AM.   Medications reviewed; 40 mg oral Protonix BID, 1 packet Miralax/day, 1 g Carafate TID. Labs reviewed.    Diet Order:  Diet regular Room service appropriate? Yes; Fluid consistency: Thin  Skin:  Reviewed, no issues  Last BM:  7/22  Height:   Ht Readings from Last 1 Encounters:  11/03/16 '5\' 6"'$  (1.676 m)    Weight:   Wt Readings from Last 1 Encounters:  11/03/16 135 lb 12.9 oz (61.6 kg)    Ideal Body Weight:  59.09 kg  BMI:  Body mass index is 21.92 kg/m.  Estimated Nutritional Needs:   Kcal:  0623-7628 (30-35 kcal/kg)  Protein:  90-100 grams  Fluid:  >/= 2 L/day  EDUCATION NEEDS:   No  education needs identified at this time    Jarome Matin, MS, RD, LDN, [facility] Inpatient Clinical Dietitian Pager # [phone] After hours/weekend pager # [phone]

## 2016-11-05 ENCOUNTER — Inpatient Hospital Stay (HOSPITAL_COMMUNITY): Payer: Medicare Other

## 2016-11-05 ENCOUNTER — Other Ambulatory Visit: Payer: Self-pay | Admitting: Oncology

## 2016-11-05 DIAGNOSIS — I2699 Other pulmonary embolism without acute cor pulmonale: Secondary | ICD-10-CM

## 2016-11-05 LAB — ECHOCARDIOGRAM COMPLETE
Height: 66 in
WEIGHTICAEL: 2172.85 [oz_av]

## 2016-11-05 LAB — CBC
HEMATOCRIT: 25.8 % — AB (ref 36.0–46.0)
HEMOGLOBIN: 8.5 g/dL — AB (ref 12.0–15.0)
MCH: 25.7 pg — AB (ref 26.0–34.0)
MCHC: 32.9 g/dL (ref 30.0–36.0)
MCV: 77.9 fL — ABNORMAL LOW (ref 78.0–100.0)
Platelets: 403 10*3/uL — ABNORMAL HIGH (ref 150–400)
RBC: 3.31 MIL/uL — ABNORMAL LOW (ref 3.87–5.11)
RDW: 20.8 % — ABNORMAL HIGH (ref 11.5–15.5)
WBC: 8.8 10*3/uL (ref 4.0–10.5)

## 2016-11-05 LAB — BASIC METABOLIC PANEL
ANION GAP: 8 (ref 5–15)
BUN: 11 mg/dL (ref 6–20)
CO2: 27 mmol/L (ref 22–32)
Calcium: 9.3 mg/dL (ref 8.9–10.3)
Chloride: 103 mmol/L (ref 101–111)
Creatinine, Ser: 0.66 mg/dL (ref 0.44–1.00)
GFR calc non Af Amer: >60 mL/min (ref >60)
Glucose, Bld: 103 mg/dL — ABNORMAL HIGH (ref 65–99)
POTASSIUM: 4.1 mmol/L (ref 3.5–5.1)
Sodium: 138 mmol/L (ref 135–145)

## 2016-11-05 MED ORDER — SORBITOL 70 % SOLN
30.0000 mL | Freq: Once | Status: AC
  Administered 2016-11-05: 30 mL via ORAL
  Filled 2016-11-05: qty 30

## 2016-11-05 MED ORDER — MORPHINE SULFATE ER 30 MG PO TBCR
30.0000 mg | EXTENDED_RELEASE_TABLET | Freq: Two times a day (BID) | ORAL | Status: DC
  Administered 2016-11-05 – 2016-11-07 (×4): 30 mg via ORAL
  Filled 2016-11-05 (×4): qty 1

## 2016-11-05 MED ORDER — DIPHENHYDRAMINE HCL 50 MG PO CAPS
50.0000 mg | ORAL_CAPSULE | Freq: Once | ORAL | Status: AC
  Administered 2016-11-05: 50 mg via ORAL
  Filled 2016-11-05: qty 1

## 2016-11-05 MED ORDER — DIPHENHYDRAMINE HCL 25 MG PO CAPS: 25.0000 mg | ORAL_CAPSULE | Freq: Four times a day (QID) | ORAL | Status: DC | PRN

## 2016-11-05 MED ORDER — CLONIDINE HCL 0.1 MG PO TABS
0.1000 mg | ORAL_TABLET | Freq: Two times a day (BID) | ORAL | Status: DC
  Administered 2016-11-05 – 2016-11-06 (×3): 0.1 mg via ORAL
  Filled 2016-11-05 (×3): qty 1

## 2016-11-05 MED ORDER — MORPHINE SULFATE 15 MG PO TABS
15.0000 mg | ORAL_TABLET | Freq: Once | ORAL | Status: AC
  Administered 2016-11-05: 15 mg via ORAL
  Filled 2016-11-05: qty 1

## 2016-11-05 MED ORDER — MORPHINE SULFATE 15 MG PO TABS
30.0000 mg | ORAL_TABLET | ORAL | Status: DC | PRN
  Administered 2016-11-05 – 2016-11-07 (×5): 30 mg via ORAL
  Filled 2016-11-05 (×5): qty 2

## 2016-11-05 NOTE — Progress Notes (Signed)
Physical Therapy Evaluation Patient Details Name: Emily Cohen MRN: [id number] DOB: 03/08/1951 Today's Date: 11/05/2016   History of Present Illness  Pt is an 66 y.o. female with PMHx of smoking and alcohol abuse, gastric ulcer, and  metastatic cancer of unknown primary. Pt presented to the ED with a week's history of chest pain and back pain which has progressively gotten worse. Pt admitted for PE which has been confirmed by imaging.  Clinical Impression  Pt admitted with the conditions listed above. Pt currently with functional limitations due to the deficits listed below. Pt will benefit from skilled PT to increase their independence and safety with mobility to allow discharge. Pt was able to ambulate but reported pain 9/10 after going 20 feet.    Follow Up Recommendations Home health PT;Supervision - Intermittent    Equipment Recommendations  None recommended by PT    Recommendations for Other Services       Precautions / Restrictions Precautions Precautions: Fall Restrictions Weight Bearing Restrictions: No      Mobility  Bed Mobility Overal bed mobility: Needs Assistance Bed Mobility: Supine to Sit     Supine to sit: HOB elevated;Supervision     General bed mobility comments: Pt required 1 person hand held assist to raise trunk and steady at EOB  Transfers Overall transfer level: Needs assistance Equipment used: Rolling walker (2 wheeled) Transfers: Sit to/from Stand Sit to Stand: Min guard         General transfer comment: Pt required someone to brace walker, min guard was provided for safety  Ambulation/Gait Ambulation/Gait assistance: Min guard Ambulation Distance (Feet): 20 Feet Assistive device: Rolling walker (2 wheeled) Gait Pattern/deviations: Step-through pattern;Decreased stride length Gait velocity: decr   General Gait Details: Pt able to ambulate 20 feet before reporting pain 9/10 though back, neck, and arms. While ambulating pt SPO2  remained above 92% on room air. Pt did report feeling light headed and took one standing rest break. The recliner was brought for pt to rest during session.  Stairs            Wheelchair Mobility    Modified Rankin (Stroke Patients Only)       Balance Overall balance assessment: Needs assistance Sitting-balance support: Feet supported Sitting balance-Leahy Scale: Fair     Standing balance support: Bilateral upper extremity supported Standing balance-Leahy Scale: Poor Standing balance comment: Patient requires RW for standing balance                              Pertinent Vitals/Pain Pain Assessment: 0-10 Pain Score: 9  Pain Location: Pt reports widespread pain in back, neck, and arms Pain Descriptors / Indicators: Grimacing;Sharp Pain Intervention(s): Limited activity within patient's tolerance;Repositioned;Patient requesting pain meds-RN notified;Premedicated before session    Home Living Family/patient expects to be discharged to:: Private residence Living Arrangements: Non-relatives/Friends Available Help at Discharge: Personal care attendant;Available PRN/intermittently Type of Home: Mobile home Home Access: Stairs to enter Entrance Stairs-Rails: Right;Left Entrance Stairs-Number of Steps: 3 Home Layout: One level Home Equipment: Walker - 2 wheels;Cane - single point      Prior Function Level of Independence: Needs assistance   Gait / Transfers Assistance Needed: Pt able to walk with assistive devices  ADL's / Homemaking Assistance Needed: Pt has caregiver support for bathing and other ADLS        Hand Dominance        Extremity/Trunk Assessment   Upper Extremity Assessment Upper  Extremity Assessment: Overall WFL for tasks assessed    Lower Extremity Assessment Lower Extremity Assessment: Generalized weakness       Communication   Communication: No difficulties  Cognition Arousal/Alertness: Awake/alert Behavior During Therapy:  WFL for tasks assessed/performed Overall Cognitive Status: Within Functional Limits for tasks assessed                                        General Comments      Exercises     Assessment/Plan    PT Assessment Patient needs continued PT services  PT Problem List Decreased strength;Decreased mobility;Decreased safety awareness;Decreased activity tolerance;Decreased balance;Decreased coordination       PT Treatment Interventions Therapeutic activities;Gait training;Therapeutic exercise;Balance training;Functional mobility training;Patient/family education    PT Goals (Current goals can be found in the Care Plan section)  Acute Rehab PT Goals Patient Stated Goal: Pt would like to return home PT Goal Formulation: With patient Time For Goal Achievement: 11/19/16 Potential to Achieve Goals: Good    Frequency Min 3X/week   Barriers to discharge        Co-evaluation               AM-PAC PT "6 Clicks" Daily Activity  Outcome Measure Difficulty turning over in bed (including adjusting bedclothes, sheets and blankets)?: A Little Difficulty moving from lying on back to sitting on the side of the bed? : Total Difficulty sitting down on and standing up from a chair with arms (e.g., wheelchair, bedside commode, etc,.)?: Total Help needed moving to and from a bed to chair (including a wheelchair)?: A Lot Help needed walking in hospital room?: A Lot Help needed climbing 3-5 steps with a railing? : A Lot 6 Click Score: 11    End of Session Equipment Utilized During Treatment: Gait belt Activity Tolerance: Patient limited by pain Patient left: in chair;with call bell/phone within reach;with chair alarm set Nurse Communication: Patient requests pain meds;Mobility status PT Visit Diagnosis: Difficulty in walking, not elsewhere classified (R26.2)    Time: 1115-1130 PT Time Calculation (min) (ACUTE ONLY): 15 min   Charges:   PT Evaluation $PT Eval Low  Complexity: 1 Procedure     PT G Codes:        Olegario Shearer, SPT  Reino Bellis 11/05/2016, 1:02 PM

## 2016-11-05 NOTE — Progress Notes (Addendum)
  Echocardiogram 2D Echocardiogram has been performed.  Technically difficult study due to patient breast implant.   Burgundy Matuszak L Androw 11/05/2016, 1:27 PM

## 2016-11-05 NOTE — Progress Notes (Signed)
PROGRESS NOTE    Emily Cohen  YHC:[id number] DOB: [birth year] DOA: 11/02/2016 PCP: Benito Mccreedy, MD    Brief Narrative:  Emily Cohen is an 66 y.o. female past medical history significant for smoking alcohol abuse gastric ulcer in February is here by EGD last month diagnosed with metastatic cancer of unknown primary on July 20 presents to the ED with a week's history of chest pain and back pain which has progressively gotten worse CT images show acute PE.    Assessment & Plan:   Principal Problem:   Acute pulmonary embolism (HCC) Active Problems:   Metastatic adenocarcinoma ([place])   Essential hypertension   History of gastric ulcer   Metastatic cancer to bone (HCC)   Microcytic hypochromic anemia   Malnutrition of moderate degree   Metastatic disease (HCC)   Pathologic fracture of vertebrae  #1 acute pulmonary embolism Per CT angiogram chest. Likely secondary to malignancy. Lower extremity Dopplers negative for DVT. 2-D echo pending. Patient has been transitioned from IV heparin to full dose Lovenox. Avoid NSAIDs. Outpatient follow-up.  #2 hypertension  Blood pressure borderline. Discontinued Norvasc. Decrease clonidine to 0.1 mg twice a day.  #3 history of gastric ulcer Stable. Avoid NSAIDs. Continue PPI. Follow H&H.  #4 metastatic cancer of unknown primary to bone/metastatic adenocarcinoma Patient status post CT-guided biopsy of right pubic bone lesion per Interventional radiology, Dr. Anselm Pancoast 11/03/2016. Biopsy results consistent with metastatic adenocarcinoma. Patient states pain not well controlled at this time. Patient with PET scan done 10/30/2016 showing widespread hypermetabolic and lytic osseous metastatic disease including pathologic fractures of the left proximal humerus, right 10th rib and left upper manubrium. Right lower lobe nodule. Malignancy. Hypermetabolic right adrenal mass compatible with metastatic disease. Questionable small metastatic focus  in the left pectoralis major muscle. Case discussed with oncology who recommended checking a CEA and CA 27.9 which have been ordered.. Increase MS Contin to 30 mg twice a day and MSIR to 30 mg every 4 hours when necessary breakthrough pain. Oncology consultation pending for further evaluation and recommendations.  #5 anemia Likely anemia of chronic disease. Hemoglobin currently at 8.5. No overt bleeding. Follow H&H and transfuse for hemoglobin less than 7.  #6 hypokalemia Repleted.   DVT prophylaxis: Heparin drip>>>> Lovenox Code Status: Full Family Communication: No family at bedside. Disposition Plan: Pending PT evaluation home with home health versus skilled nursing facility when medically stable.   Consultants:   Interventional radiology: Dr. Anselm Pancoast 11/03/2016  Procedures:   CT-guided biopsy of right pubic bone lesion per Dr. Anselm Pancoast 11/03/2016  CT angiogram chest 11/02/2016  Chest x-ray 11/02/2016  Lower extremity Dopplers 11/05/2016  2-D echo pending 11/05/2016  Antimicrobials:   None   Subjective: Patient complaining of diffuse pain all over stating pain is not well controlled. Patient denies any chest pain. Patient states shortness of breath improved.Patient states pain medication does not last too long.   Objective: Vitals:   11/04/16 1517 11/04/16 2021 11/05/16 0443 11/05/16 0946  BP: 102/63 102/61 (!) 102/55 (!) 105/56  Pulse: 80 78 80   Resp: 16 18 20    Temp: 98.9 F (37.2 C) 98.5 F (36.9 C) 99.2 F (37.3 C)   TempSrc: Oral Oral Oral   SpO2: 95% 92% 92%   Weight:      Height:        Intake/Output Summary (Last 24 hours) at 11/05/16 1025 Last data filed at 11/05/16 0900  Gross per 24 hour  Intake  490 ml  Output             1300 ml  Net             -810 ml   Filed Weights   12-02-16 0138  Weight: 61.6 kg (135 lb 12.9 oz)    Examination:  General exam: Appears calm and comfortable  Respiratory system: Clear to auscultation.  Respiratory effort normal. Cardiovascular system: S1 & S2 heard, RRR. No JVD, murmurs, rubs, gallops or clicks. No pedal edema. Gastrointestinal system: Abdomen is nondistended, soft and nontender. No organomegaly or masses felt. Normal bowel sounds heard. Central nervous system: Alert and oriented. No focal neurological deficits. Extremities: Symmetric 5 x 5 power. Skin: No rashes, lesions or ulcers Psychiatry: Judgement and insight appear normal. Mood & affect appropriate.     Data Reviewed: I have personally reviewed following labs and imaging studies  CBC:  Recent Labs Lab 11/02/16 1433 12-02-16 0433 11/04/16 0101 11/05/16 0025  WBC 15.4* 10.8* 9.5 8.8  HGB 10.1* 9.1* 8.5* 8.5*  HCT 29.8* 27.5* 26.0* 25.8*  MCV 75.1* 77.2* 77.4* 77.9*  PLT 491* 378 390 263*   Basic Metabolic Panel:  Recent Labs Lab 11/02/16 1745 Dec 02, 2016 0433 11/05/16 0025  NA 138 140 138  K 3.0* 3.9 4.1  CL 99* 104 103  CO2 25 26 27   GLUCOSE 112* 106* 103*  BUN 7 7 11   CREATININE 0.61 0.51 0.66  CALCIUM 10.1 9.5 9.3   GFR: Estimated Creatinine Clearance: 65.6 mL/min (by C-G formula based on SCr of 0.66 mg/dL). Liver Function Tests:  Recent Labs Lab 11/02/16 1745  AST 16  ALT 12*  ALKPHOS 143*  BILITOT 1.2  PROT 7.6  ALBUMIN 4.1   No results for input(s): LIPASE, AMYLASE in the last 168 hours. No results for input(s): AMMONIA in the last 168 hours. Coagulation Profile: No results for input(s): INR, PROTIME in the last 168 hours. Cardiac Enzymes: No results for input(s): CKTOTAL, CKMB, CKMBINDEX, TROPONINI in the last 168 hours. BNP (last 3 results) No results for input(s): PROBNP in the last 8760 hours. HbA1C: No results for input(s): HGBA1C in the last 72 hours. CBG:  Recent Labs Lab 10/30/16 1039  GLUCAP 119*   Lipid Profile: No results for input(s): CHOL, HDL, LDLCALC, TRIG, CHOLHDL, LDLDIRECT in the last 72 hours. Thyroid Function Tests: No results for input(s): TSH,  T4TOTAL, FREET4, T3FREE, THYROIDAB in the last 72 hours. Anemia Panel:  Recent Labs  02-Dec-2016 0433 11/04/16 0804  VITAMINB12  --  881  FOLATE  --  10.6  FERRITIN 150  --   TIBC 266  --   IRON 63  --   RETICCTPCT  --  4.3*   Sepsis Labs: No results for input(s): PROCALCITON, LATICACIDVEN in the last 168 hours.  No results found for this or any previous visit (from the past 240 hour(s)).       Radiology Studies: Ct Biopsy  Result Date: 2016/12/02 INDICATION: 66 year old with the diffuse metastatic bone disease and history of breast cancer. Patient also has a concerning right lung nodule and right adrenal lesion. Tissue diagnosis is needed. Recent diagnosis of pulmonary embolism. IV heparin was stopped prior to the procedure. EXAM: CT-GUIDED BIOPSY OF RIGHT PUBIC BONE LESION MEDICATIONS: None. ANESTHESIA/SEDATION: Moderate (conscious) sedation was employed during this procedure. A total of Versed 4.0 mg and Fentanyl 100 mcg was administered intravenously. Moderate Sedation Time: 18 minutes. The patient's level of consciousness and vital signs were monitored continuously by radiology  nursing throughout the procedure under my direct supervision. FLUOROSCOPY TIME:  None COMPLICATIONS: None immediate. PROCEDURE: Informed written consent was obtained from the patient after a thorough discussion of the procedural risks, benefits and alternatives. All questions were addressed. A timeout was performed prior to the initiation of the procedure. Patient was placed supine on the CT scanner. Images of the pelvis were obtained. The destructive and lucent lesion in the anterior right pubic bone was targeted. Anterior pelvis was prepped and draped in sterile fashion. Skin and soft tissues were anesthetized with 1% lidocaine. 32 gauge needle was directed into the anterior aspect of the lesion with CT guidance. A total of 4 fine-needle aspirations were performed with [place] and [place] needles. Small core  samples were actually obtained from the aspirate needles along with aspirate material. Aspiration was also obtained from the 17 gauge needle as it was removed. Bandage placed over the puncture site. FINDINGS: Lucent lesion involving the anterior right pubic bone with some bone destruction along the anterior cortex. Biopsy needles were confirmed within the lucent lesion. No complicating features on the post biopsy imaging. IMPRESSION: CT-guided biopsy of the right pubic bone lesion. Electronically Signed   By: Markus Daft M.D.   On: 11/03/2016 17:11        Scheduled Meds: . cloNIDine  0.1 mg Oral BID  . enoxaparin (LOVENOX) injection  60 mg Subcutaneous Q12H  . feeding supplement (ENSURE ENLIVE)  237 mL Oral BID BM  . morphine  15 mg Oral Q12H  . nicotine  21 mg Transdermal Daily  . pantoprazole  40 mg Oral BID  . polyethylene glycol  17 g Oral BID  . sucralfate  1 g Oral TID AC & HS  . venlafaxine XR  75 mg Oral Daily   Continuous Infusions:   LOS: 3 days    Time spent: 70 minutes    THOMPSON,DANIEL, MD [facility] Pager [phone]  If 7PM-7AM, please contact night-coverage [email or web address] Password [facility] 11/05/2016, 10:25 AM

## 2016-11-05 NOTE — Progress Notes (Signed)
**  Preliminary report by tech**  Bilateral lower extremity venous duplex completed. There is no evidence of deep or superficial vein thrombosis involving the right and left lower extremities. All visualized vessels appear patent and compressible. There is no evidence of Baker's cysts bilaterally.  11/05/16 9:47 AM Carlos Levering RVT

## 2016-11-06 ENCOUNTER — Inpatient Hospital Stay (HOSPITAL_COMMUNITY): Payer: Medicare Other

## 2016-11-06 DIAGNOSIS — C7971 Secondary malignant neoplasm of right adrenal gland: Secondary | ICD-10-CM

## 2016-11-06 DIAGNOSIS — C773 Secondary and unspecified malignant neoplasm of axilla and upper limb lymph nodes: Secondary | ICD-10-CM

## 2016-11-06 DIAGNOSIS — C7951 Secondary malignant neoplasm of bone: Secondary | ICD-10-CM

## 2016-11-06 DIAGNOSIS — C50919 Malignant neoplasm of unspecified site of unspecified female breast: Secondary | ICD-10-CM

## 2016-11-06 DIAGNOSIS — E440 Moderate protein-calorie malnutrition: Secondary | ICD-10-CM

## 2016-11-06 LAB — BASIC METABOLIC PANEL
Anion gap: 9 (ref 5–15)
BUN: 9 mg/dL (ref 6–20)
CALCIUM: 10 mg/dL (ref 8.9–10.3)
CO2: 29 mmol/L (ref 22–32)
CREATININE: 0.61 mg/dL (ref 0.44–1.00)
Chloride: 100 mmol/L — ABNORMAL LOW (ref 101–111)
GFR calc Af Amer: >60 mL/min (ref >60)
Glucose, Bld: 110 mg/dL — ABNORMAL HIGH (ref 65–99)
POTASSIUM: 3.8 mmol/L (ref 3.5–5.1)
SODIUM: 138 mmol/L (ref 135–145)

## 2016-11-06 LAB — CEA: CEA1: 483.5 ng/mL — AB (ref 0.0–4.7)

## 2016-11-06 LAB — CANCER ANTIGEN 27.29: CAN 27.29: 859.9 U/mL — AB (ref 0.0–38.6)

## 2016-11-06 MED ORDER — ANASTROZOLE 1 MG PO TABS: 1.0000 mg | ORAL_TABLET | Freq: Every day | ORAL | 0 refills | Status: AC

## 2016-11-06 MED ORDER — ENSURE ENLIVE PO LIQD: 237.0000 mL | Freq: Two times a day (BID) | ORAL | 12 refills | Status: AC

## 2016-11-06 MED ORDER — ANASTROZOLE 1 MG PO TABS
1.0000 mg | ORAL_TABLET | Freq: Every day | ORAL | Status: DC
  Administered 2016-11-06: 1 mg via ORAL
  Filled 2016-11-06 (×2): qty 1

## 2016-11-06 MED ORDER — MORPHINE SULFATE 30 MG PO TABS: 30.0000 mg | ORAL_TABLET | ORAL | 0 refills | Status: AC | PRN

## 2016-11-06 MED ORDER — CLONIDINE HCL 0.1 MG PO TABS
0.1000 mg | ORAL_TABLET | Freq: Every day | ORAL | Status: DC
  Administered 2016-11-07: 0.1 mg via ORAL
  Filled 2016-11-06: qty 1

## 2016-11-06 MED ORDER — MORPHINE SULFATE ER 30 MG PO TBCR: 30.0000 mg | EXTENDED_RELEASE_TABLET | Freq: Two times a day (BID) | ORAL | 0 refills | Status: AC

## 2016-11-06 MED ORDER — GADOBENATE DIMEGLUMINE 529 MG/ML IV SOLN
15.0000 mL | Freq: Once | INTRAVENOUS | Status: AC | PRN
  Administered 2016-11-06: 12 mL via INTRAVENOUS

## 2016-11-06 MED ORDER — NICOTINE 21 MG/24HR TD PT24: 21.0000 mg | MEDICATED_PATCH | Freq: Every day | TRANSDERMAL | 0 refills | Status: AC

## 2016-11-06 MED ORDER — ENOXAPARIN SODIUM 60 MG/0.6ML ~~LOC~~ SOLN: 60.0000 mg | Freq: Two times a day (BID) | SUBCUTANEOUS | 0 refills | Status: AC

## 2016-11-06 MED ORDER — CLONIDINE HCL 0.1 MG PO TABS: 0.1000 mg | ORAL_TABLET | Freq: Every day | ORAL | 0 refills | Status: AC

## 2016-11-06 NOTE — Consult Note (Signed)
[place]  [phone] [phone]     ID: Emily Cohen DOB: 12/18/1950  MR#: [id number]  UXN#:[id number]  Patient Care Team: Benito Mccreedy, MD as PCP - General (Internal Medicine) Chauncey Cruel, MD OTHER MD:  CHIEF COMPLAINT: metastatic breast cancer  CURRENT TREATMENT: to start anastrozole   BREAST CANCER HISTORY: Emily Cohen tells me she had breast cancer "in her twenties." She had a left mastectomy with implant placement, no radiation or antiestrogens and no chemotherapy. We have no records available to review this but it is consistent with her exam.  More recently she has been having increasing chest wall pain. Dr Vista Lawman set her up for plain rib fims which were negative, then a chest CT 10/09/2016 which showed a 0.9 cm RLL lesion and multiple bone lesions including multiple compression fractures. PET scan 10/31/2015 confirmed above and showed in addition a left chest wall lesion and right adrenal mass.   The patient was admitted with poorly controlled pain 11/02/2016 and CT angio of the chest documented a RLL PE. .)n 11/03/2016 right pubic ramus biopsy showed metastatic adenocarcinoma ([id number]). We have requested a prognostic panel. We also requested tumor markers, and the patient's CA 27-29 is very elevated at 859.9. This tell s [place] we are dealing with metastatic breast cancer, not an adenocarcinoma from another site.  The patient's subsequent history is as detailed below.  INTERVAL HISTORY: I met with the patient in her hospital room 11/06/2016. No family was present  REVIEW OF SYSTEMS: The patient's pain is currently well controlled and she denies significant constipation from her narcotics. She has had no hemoptysis and no bleeding complications from treatment of her PE. She has had persistent though mild headaches the past 10 days or so, denies N/V. A detailed ROS this AM was otherwise noncontributory  PAST MEDICAL  HISTORY: Past Medical History:  Diagnosis Date  . Alcohol abuse 2014  . Anxiety   . Arthritis    "maybe in my legs" (10/07/2016)  . Breast cancer, left breast ([facility]) <2002  . Chronic back pain   . Depression   . Gastric ulcer 05/2016   caused hematemesis.  GU with VV cauterized at EGD.  using NSAIDs.  biopsy negative for H Pylori.    Marland Kitchen GERD (gastroesophageal reflux disease)   . Hypertension   . Left humeral fracture 01/2016   did not follow up with ortho as planned  . Pancreatitis 2014   likely due to ETOH  . Pre-diabetes     PAST SURGICAL HISTORY: Past Surgical History:  Procedure Laterality Date  . ABDOMINAL HYSTERECTOMY    . BREAST BIOPSY Left <2002  . COLONOSCOPY N/A 10/08/2016   Procedure: COLONOSCOPY;  Surgeon: Irene Shipper, MD;  Location: [facility] ENDOSCOPY;  Service: Endoscopy;  Laterality: N/A;  . ESOPHAGOGASTRODUODENOSCOPY N/A 05/23/2016   Procedure: ESOPHAGOGASTRODUODENOSCOPY (EGD);  Surgeon: Milus Banister, MD;  Location: [place];  Service: Endoscopy;  Laterality: N/A;  . ESOPHAGOGASTRODUODENOSCOPY  05/2016   Dr Owens Loffler.  for hematemesis.  cauterized a large GU, gastritis noted.  H pylori negativ on biopsy  . ESOPHAGOGASTRODUODENOSCOPY N/A 10/08/2016   Procedure: ESOPHAGOGASTRODUODENOSCOPY (EGD);  Surgeon: Irene Shipper, MD;  Location: [facility] ENDOSCOPY;  Service: Endoscopy;  Laterality: N/A;  . MASTECTOMY Left <2002   for cancer  . TONSILLECTOMY      FAMILY HISTORY Family History  Problem Relation Age of Onset  . Hypertension Mother   . Diabetes Mother   . Diabetes Father   .  Colon cancer Neg Hx   . Rectal cancer Neg Hx   . Esophageal cancer Neg Hx   The patient's parents died from complications of DM. The patient had 3 brothers, no sisters. The only breast cancer in the family was one aunt diagnosed in her 43's. There is no history of ovarian cancer in the family.  GYNECOLOGIC HISTORY:  No LMP recorded. Patient is postmenopausal. Menarche 35, she is GX  P0. Stopped having periods in her 20's. Did not receive HR.  SOCIAL HISTORY:  Retired, worked at [employer] and other jobs. Separated. Lives with her SO, Remo Lipps, who is retired from [employer]. She has an aide who comes to the house daily and helps with cleaning and cooking.    ADVANCED DIRECTIVES: not in place   HEALTH MAINTENANCE: Social History  Substance Use Topics  . Smoking status: Current Every Day Smoker    Packs/day: 1.00    Years: 47.00    Types: Cigarettes  . Smokeless tobacco: Never Used  . Alcohol use Yes     Comment: 10/07/2016 "nothing in 7 months; heavy drinker before that"     Colonoscopy: June 2018/ Mickel Duhamel  PAP:  Bone density:   No Known Allergies  Current Facility-Administered Medications  Medication Dose Route Frequency Provider Last Rate Last Dose  . acetaminophen (TYLENOL) tablet 650 mg  650 mg Oral Q6H PRN Etta Quill, DO   650 mg at 11/04/16 1221   Or  . acetaminophen (TYLENOL) suppository 650 mg  650 mg Rectal Q6H PRN Etta Quill, DO      . cloNIDine (CATAPRES) tablet 0.1 mg  0.1 mg Oral BID Eugenie Filler, MD   0.1 mg at 11/05/16 2146  . diclofenac sodium (VOLTAREN) 1 % transdermal gel 2 g  2 g Topical QID PRN Etta Quill, DO      . diphenhydrAMINE (BENADRYL) capsule 25 mg  25 mg Oral Q6H PRN Blount, Scarlette Shorts T, NP      . enoxaparin (LOVENOX) injection 60 mg  60 mg Subcutaneous Q12H Thomes Lolling, RPH   60 mg at 11/05/16 2146  . feeding supplement (ENSURE ENLIVE) (ENSURE ENLIVE) liquid 237 mL  237 mL Oral BID BM Jennette Kettle M, DO   237 mL at 11/05/16 1500  . lidocaine (XYLOCAINE) 1 % (with pres) injection    PRN Markus Daft, MD   10 mL at 11/03/16 1643  . morphine (MS CONTIN) 12 hr tablet 30 mg  30 mg Oral Q12H Eugenie Filler, MD   30 mg at 11/05/16 2146  . morphine (MSIR) tablet 30 mg  30 mg Oral Q4H PRN Eugenie Filler, MD   30 mg at 11/06/16 3546  . morphine 4 MG/ML injection 2-4 mg  2-4 mg  Intravenous Q4H PRN Etta Quill, DO   4 mg at 11/05/16 2029  . nicotine (NICODERM CQ - dosed in mg/24 hours) patch 21 mg  21 mg Transdermal Daily Jennette Kettle M, DO   21 mg at 11/05/16 0946  . ondansetron (ZOFRAN) tablet 4 mg  4 mg Oral Q6H PRN Etta Quill, DO       Or  . ondansetron [facility]) injection 4 mg  4 mg Intravenous Q6H PRN Etta Quill, DO      . pantoprazole (PROTONIX) EC tablet 40 mg  40 mg Oral BID Etta Quill, DO   40 mg at 11/05/16 2147  . polyethylene glycol (MIRALAX / GLYCOLAX) packet 17  g  17 g Oral BID Eugenie Filler, MD   17 g at 11/05/16 2146  . promethazine (PHENERGAN) tablet 25 mg  25 mg Oral Q6H PRN Etta Quill, DO      . senna (SENOKOT) tablet 8.6 mg  1 tablet Oral Daily PRN Etta Quill, DO      . sucralfate (CARAFATE) 1 GM/10ML suspension 1 g  1 g Oral TID AC & HS Etta Quill, DO   1 g at 11/05/16 2146  . tiZANidine (ZANAFLEX) tablet 4-8 mg  4-8 mg Oral Q8H PRN Etta Quill, DO   8 mg at 11/04/16 6378  . venlafaxine XR (EFFEXOR-XR) 24 hr capsule 75 mg  75 mg Oral Daily Jennette Kettle M, DO   75 mg at 11/05/16 0946  . zolpidem (AMBIEN) tablet 5 mg  5 mg Oral QHS PRN Charlynne Cousins, MD   5 mg at 11/05/16 2151    OBJECTIVE: middle aged African American woman examined in bed  Vitals:   11/05/16 2110 11/06/16 0515  BP: 118/61 (!) 91/53  Pulse: 92 80  Resp: 18 18  Temp: 98.7 F (37.1 C) 97.9 F (36.6 C)     Body mass index is 21.92 kg/m.   Wt Readings from Last 3 Encounters:  11/03/16 135 lb 12.9 oz (61.6 kg)  10/29/16 135 lb (61.2 kg)  10/18/16 140 lb (63.5 kg)      ECOG FS:2 - Symptomatic, <50% confined to bed  Ocular: Sclerae unicteric, pupils round and equal Ear-nose-throat: full upper and lower dentures Lymphatic: No cervical or supraclavicular adenopathy Lungs no rales or rhonchi Heart regular rate and rhythm Abd soft, nontender, positive bowel sounds Neuro: non-focal, well-oriented, appropriate  affect Breasts: the right breast is benign; the left breast is s/p mastectomy with implant in place; laterally there is a 2.5 cm movable hard mass c/w CA   LAB RESULTS:  CMP     Component Value Date/Time   NA 138 11/06/2016 0428   K 3.8 11/06/2016 0428   CL 100 (L) 11/06/2016 0428   CO2 29 11/06/2016 0428   GLUCOSE 110 (H) 11/06/2016 0428   BUN 9 11/06/2016 0428   CREATININE 0.61 11/06/2016 0428   CALCIUM 10.0 11/06/2016 0428   PROT 7.6 11/02/2016 1745   ALBUMIN 4.1 11/02/2016 1745   AST 16 11/02/2016 1745   ALT 12 (L) 11/02/2016 1745   ALKPHOS 143 (H) 11/02/2016 1745   BILITOT 1.2 11/02/2016 1745   GFRNONAA >60 11/06/2016 0428   GFRAA >60 11/06/2016 0428    No results found for: TOTALPROTELP, ALBUMINELP, A1GS, A2GS, BETS, BETA2SER, GAMS, MSPIKE, SPEI  No results found for: KPAFRELGTCHN, LAMBDASER, KAPLAMBRATIO  Lab Results  Component Value Date   WBC 8.8 11/05/2016   NEUTROABS 11.9 (H) 10/04/2016   HGB 8.5 (L) 11/05/2016   HCT 25.8 (L) 11/05/2016   MCV 77.9 (L) 11/05/2016   PLT 403 (H) 11/05/2016    _0 @  No results found for: LABCA2  No components found for: HYIFOY774  No results for input(s): INR in the last 168 hours.  Urinalysis    Component Value Date/Time   COLORURINE STRAW (A) 10/18/2016 2353   APPEARANCEUR CLEAR 10/18/2016 2353   LABSPEC 1.013 10/18/2016 2353   PHURINE 6.0 10/18/2016 2353   GLUCOSEU NEGATIVE 10/18/2016 2353   HGBUR NEGATIVE 10/18/2016 2353   BILIRUBINUR NEGATIVE 10/18/2016 2353   KETONESUR NEGATIVE 10/18/2016 2353   PROTEINUR NEGATIVE 10/18/2016 2353   UROBILINOGEN 0.2 02/20/2013 1609  NITRITE NEGATIVE 10/18/2016 2353   LEUKOCYTESUR NEGATIVE 10/18/2016 2353     STUDIES: Dg Chest 2 View  Result Date: 11/02/2016 CLINICAL DATA:  Chronic central chest pain, worsened EXAM: CHEST  2 VIEW COMPARISON:  Head CT 10/30/2016, radiograph 10/04/2016, CT 10/09/2016 FINDINGS: No acute pulmonary infiltrate, consolidation, or  pleural effusion is seen. Surgical clips over the left chest. Post mastectomy changes on the left. Stable cardiomediastinal silhouette. Stable compression deformities of the thoracic and lumbar spine. Partially visualized left proximal humerus fracture. Possible healing left tenth rib fracture. Right tenth rib fracture. IMPRESSION: 1. No acute infiltrate or edema. 2. Stable appearance of spinal compression deformities. Acute right tenth rib fracture. Possible healing left tenth rib fracture. Electronically Signed   By: Donavan Foil M.D.   On: 11/02/2016 15:28   Dg Abd 1 View  Result Date: 10/07/2016 CLINICAL DATA:  Constipation.  Left-sided abdominal pain.  Nausea. EXAM: ABDOMEN - 1 VIEW COMPARISON:  CT scan dated 10/01/2016 FINDINGS: The bowel gas pattern is normal. No radio-opaque calculi or other significant radiographic abnormality are seen. IMPRESSION: Benign-appearing abdomen and pelvis. No excessive stool in the colon. No fecal impaction. Electronically Signed   By: Lorriane Shire M.D.   On: 10/07/2016 14:09   Ct Chest W Contrast  Result Date: 10/09/2016 CLINICAL DATA:  Chronic cough, smoker, hypertension, history LEFT breast cancer, alcohol abuse, GERD, lytic lesions within the lumbar spine on recent CT abdomen and pelvis exam, question lung cancer due to smoking history EXAM: CT CHEST WITH CONTRAST TECHNIQUE: Multidetector CT imaging of the chest was performed during intravenous contrast administration. Sagittal and coronal MPR images reconstructed from axial data set. CONTRAST:  35m ISOVUE-300 IOPAMIDOL (ISOVUE-300) INJECTION 61% IV COMPARISON:  None ; correlation chest radiograph 10/04/2016 FINDINGS: Cardiovascular: Atherosclerotic calcifications aorta and minimally in the coronary arteries. Aorta normal caliber. Aberrant origin of the RIGHT subclavian artery arising last from aortic arch and coursing posterior to the esophagus. Thoracic vascular structures grossly patent on nondedicated exam.  No pericardial effusion. Mediastinum/Nodes: Esophagus unremarkable. Base of cervical region normal appearance. LEFT breast prosthesis. No thoracic adenopathy. Lungs/Pleura: Minimal RIGHT pleural effusion and dependent atelectasis of the posterior RIGHT lower lobe. Medial RIGHT lower lobe nodule 10 x 9 mm image 60, minimally irregular. Questionable tiny RIGHT upper lobe nodules image 19. No additional mass, nodule, infiltrate or pneumothorax. Upper Abdomen: Small cyst upper pole LEFT kidney. Remaining visualized upper abdomen unremarkable. Musculoskeletal: Bones appear demineralized. Compression fractures at T3, superior endplate T5, T8, and TS85 Potential underlying lytic lesions at T3 and T8. IMPRESSION: Multiple thoracic spine compression fractures with question associated lytic lesions/pathologic fractures at T3 and T8. 10 x 9 mm slightly irregular medial RIGHT lower lobe nodule, could represent a primary or metastatic lesion. Tiny nonspecific RIGHT upper lobe nodular foci. Aortic Atherosclerosis (ICD10-I70.0). Electronically Signed   By: MLavonia DanaM.D.   On: 10/09/2016 15:04   Ct Angio Chest Pe W And/or Wo Contrast  Result Date: 11/02/2016 CLINICAL DATA:  66year old female with chronic chest wall pain and lung cancer EXAM: CT ANGIOGRAPHY CHEST WITH CONTRAST TECHNIQUE: Multidetector CT imaging of the chest was performed using the standard protocol during bolus administration of intravenous contrast. Multiplanar CT image reconstructions and MIPs were obtained to evaluate the vascular anatomy. CONTRAST:  100 mL Isovue 370 COMPARISON:  Recent prior imaging including PET-CT 11/02/2016 ; prior chest CT 10/09/2016 FINDINGS: Cardiovascular: Aberrant right subclavian artery. No evidence of aortic aneurysm. Adequate opacification of the pulmonary arteries to the segmental level.  Positive for a small filling defects within the postero basal segmental right lower lobe pulmonary artery. No additional pulmonary emboli  are identified. The heart is normal in size. No pericardial effusion. Mediastinum/Nodes: Unremarkable CT appearance of the thyroid gland. No suspicious mediastinal or hilar adenopathy. No soft tissue mediastinal mass. The thoracic esophagus is unremarkable. Lungs/Pleura: 1 cm peripherally spiculated nodule in the right lower lobe. Mild dependent atelectasis in the right lower lobe. No new focal airspace consolidation, pleural effusion, pneumothorax or pulmonary edema. Upper Abdomen: The visualized portion of the upper abdomen is unremarkable. Musculoskeletal: Surgical changes of prior left mastectomy and breast reconstruction. Stable T3 compression fracture. Compression fracture involving the inferior endplate of T8 is new compared to 10/09/2016. Nondisplaced pathologic fracture through the left aspect of the manubrium again identified. Incompletely imaged right tenth rib fracture. Known multifocal osseous metastatic disease is better demonstrated on the recent prior PET-CT. Review of the MIP images confirms the above findings. IMPRESSION: 1. Positive for isolated segmental pulmonary embolus in the right lower lobe postero basal segmental pulmonary artery. The solitary small pulmonary embolus may or may not be contributing to the patient's described clinical symptoms. 2. New T8 compression fracture compared to the CT scan of the chest dated 10/09/2016. 3. Stable T3 compression fracture, right tenth rib a pathologic fracture and manubrial pathologic fracture. 4. Multifocal osseous metastatic disease better demonstrated on recent PET-CT. 5. Additional ancillary findings as above without interval change over the last 3 days. Electronically Signed   By: Jacqulynn Cadet M.D.   On: 11/02/2016 20:46   Nm Pet Image Initial (pi) Skull Base To Thigh  Result Date: 10/30/2016 CLINICAL DATA:  Initial treatment strategy for right lower lobe lung nodule. Remote history of breast cancer. Findings of osseous metastatic  disease. EXAM: NUCLEAR MEDICINE PET SKULL BASE TO THIGH TECHNIQUE: 6.4 mCi F-18 FDG was injected intravenously. Full-ring PET imaging was performed from the skull base to thigh after the radiotracer. CT data was obtained and used for attenuation correction and anatomic localization. FASTING BLOOD GLUCOSE:  Value: 119 mg/dl COMPARISON:  Chest CT from 10/09/2016 FINDINGS: NECK Oval-shaped subcutaneous lesion just below the right submandibular gland measures 1.7 by 1.6 cm on image 34/4 and has no accentuated metabolic activity. CHEST Focal interstitial accentuation with localized air cyst in the right upper lobe, image 20/7, maximum SUV 1.6. The medial right lower lobe lesion measures 9 mm in diameter on image 29/7 and has maximum standard uptake value of 3.3. There is some faint peripheral ground-glass opacity in the superior segment right lower lobe posteriorly with very low-grade accentuated activity. Probably inflammatory. Aberrant right subclavian artery passes behind the esophagus. Aortic atherosclerotic calcification. ABDOMEN/PELVIS Hypermetabolic right adrenal mass 1.6 by 2.7 cm on image 97/4, maximum SUV 10.6. Focal calcifications in the pancreatic head, similar to 10/01/16 but increased from 05/23/2012. Maximum SUV in this region is 3.4 compared to typical pancreatic parenchymal activity of 2.1. Aortoiliac atherosclerotic vascular disease. SKELETON Widespread osseous metastatic disease. Index lesion involving the left glenoid and coracoid has a maximum SUV of 10.6. Index lesion in the anterior wall of the right acetabulum 12.5. Focal activity in the left pectoralis major muscle, maximum SUV 5.2, compatible with small metastatic deposit or possibly physiologic activity. Likely pathologic fracture the left proximal humerus. The most part the lesions are either lytic or indistinct on the CT data. Pathologic fracture of the right tenth rib. Suspected pathologic fracture of the left upper sternal manubrium.  IMPRESSION: 1. Widespread hypermetabolic and lytic osseous  metastatic disease, including pathologic fractures the left proximal humerus, right tenth rib, and left upper manubrium. 2. The right lower lobe nodule has a maximum standard uptake value of 3.3, favoring malignancy. 3. Hypermetabolic right adrenal mass, compatible with metastatic disease. 4. Subtle accentuated activity in the pancreatic head adjacent to chronic calcifications in this region. This could reflect low grade inflammation in setting of chronic calcific pancreatitis. Calcified pancreatic mass is considered less likely given the somewhat chronic appearance. 5. Questionable small metastatic focus in the left pectoralis major muscle. 6. Aortoiliac atherosclerotic vascular disease. Aberrant right subclavian artery passes behind the esophagus. Electronically Signed   By: Van Clines M.D.   On: 10/30/2016 13:35   Ct Biopsy  Result Date: 11/03/2016 INDICATION: 66 year old with the diffuse metastatic bone disease and history of breast cancer. Patient also has a concerning right lung nodule and right adrenal lesion. Tissue diagnosis is needed. Recent diagnosis of pulmonary embolism. IV heparin was stopped prior to the procedure. EXAM: CT-GUIDED BIOPSY OF RIGHT PUBIC BONE LESION MEDICATIONS: None. ANESTHESIA/SEDATION: Moderate (conscious) sedation was employed during this procedure. A total of Versed 4.0 mg and Fentanyl 100 mcg was administered intravenously. Moderate Sedation Time: 18 minutes. The patient's level of consciousness and vital signs were monitored continuously by radiology nursing throughout the procedure under my direct supervision. FLUOROSCOPY TIME:  None COMPLICATIONS: None immediate. PROCEDURE: Informed written consent was obtained from the patient after a thorough discussion of the procedural risks, benefits and alternatives. All questions were addressed. A timeout was performed prior to the initiation of the procedure. Patient  was placed supine on the CT scanner. Images of the pelvis were obtained. The destructive and lucent lesion in the anterior right pubic bone was targeted. Anterior pelvis was prepped and draped in sterile fashion. Skin and soft tissues were anesthetized with 1% lidocaine. 68 gauge needle was directed into the anterior aspect of the lesion with CT guidance. A total of 4 fine-needle aspirations were performed with [place] and [place] needles. Small core samples were actually obtained from the aspirate needles along with aspirate material. Aspiration was also obtained from the 17 gauge needle as it was removed. Bandage placed over the puncture site. FINDINGS: Lucent lesion involving the anterior right pubic bone with some bone destruction along the anterior cortex. Biopsy needles were confirmed within the lucent lesion. No complicating features on the post biopsy imaging. IMPRESSION: CT-guided biopsy of the right pubic bone lesion. Electronically Signed   By: Markus Daft M.D.   On: 11/03/2016 17:11    ELIGIBLE FOR AVAILABLE RESEARCH PROTOCOL: no  ASSESSMENT: 66 y.o. [place] woman with remote history of breast cancer, now with stage IV disease involving bones, left axilla, Right adrenal gland and possibly RLL lung, presenting July 2018  (CA 27-29 and CEA are informative  (1) s/p remote left mastectomy with implant reconstruction; did not receive radiation or anti-estrogens  (2) METASTATIC WORKUP July 2018  (a) chest CT 10/09/2016 shows RLL 1.0 cm nodule, multiple lytic bone lesions/ compression fractures  (b) PET scan 10/30/2016 finds RLL lesion SUV = 3.3; right adrenal 2.7 cm mass with SUV 10.6; left pectoralis major lesion with SUV 5.2; multiple lytic bone lesions as noted before  (c) brain MRI pending  (d) CA 27-29 was 859.9 and CEA 483 at baseline 11/05/2016  (e) Right pubic bone biopsy 11/03/2016 shows metastatic adenocarcinoma, prognostic panel pending  PLAN: I discussed with Emily Cohen that she most  likely has breast cancer metastatic to multiple places, not lung cancer. She  needs a brain MRI to complete workup--it would be useful if that could be obtained today but if not possible or if it would delay admission it can be done as outpatient.  It would also be helpful if she could be assisted to completing and notarizing a HCPOA document.  She understands that stage IV breast cancer is not curable with our current knowledge base. The goal of treatment is control. The strategy of treatment is to do only the minimum necessary to control the growth of the tumor so that the patient can have as normal a life as possible. There is no survival advantage in treating aggressively if treating less aggressively results in tumor control. With this strategy stage IV breast cancer in many cases can function as a "chronic illness": something that cannot be quite gotten rid of but can be controlled for an indefinite period of time  There are always 3 questions to go over and so we reviewed those today. The first question is do we treat or not. In some cases the patient's overall situation is so discouraging that palliative/comfort care alone is appropriate. If the decision is made to treat, then the next question is whether anti-estrogens or chemotherapy is more appropriate. Once that decision is made than the third question is: Which agent or combination of agents in particular should be used?  Emily Cohen definitely wants to be treated and her functional status, though limited, is compatible with at least anti-estrogen therapy. We do not yet have the prognostic panel from her biopsy but that has been requested. In the meantime I am starting her on anastrozole. I have discussed the possible side effects with her. She should be discharged on this medication, which does not interfere with her anticoagulation or other meds.  I will see her again 11/18/2016 at 3:45 PM at the cancer center. By then I should have all the facts  on hand and we should be able to start her on appropriate treatment, likely to include denosumab/Xgeva and fulvestrant.  Appreciate consulting with you on this case.  Marland Kitchen  Chauncey Cruel, MD   11/06/2016 8:12 AM Medical Oncology and Hematology [facility] [address] [place], [place] [address] Tel. [phone]    Fax. [phone]

## 2016-11-06 NOTE — Care Management Note (Signed)
Case Management Note  Patient Details  Name: Emily Cohen MRN: [id number] Date of Birth: 1951-03-07  Subjective/Objective:PT recc HHPT. Patient ordered for HHRN/PT/OT/SW-[facility] chosen for [place] aware(they cannot provide home social worker-MD notified & agreed still agreed to d/c plan even though ordered for CSW, able to start services in am. Also explored patient's concern about med coverage from prior admission about a month ago-patient d/c on narcoticd-she went to her pharmacy,& they said the cost was $90,then she went to [facility] otpt pharmacy, & cost was $1.00. Upon review-spoke to [facility] otpt pharmacy-patient did receive med for $100,Kendall explained that patient's former pharmacy likely did not have the med available, & the recommended med was not covered. [facility]acy will be able to fill patient scripts through her current [facility] medicare, & medicaid,they close @ 6p-Patient informed to get meds filled @ [place] before 6p(close @ 6p).  No further CM needs.                  Action/Plan:d/c home w/HHC.   Expected Discharge Date:                  Expected Discharge Plan:  [place]  In-House Referral:     Discharge planning Services  CM Consult, Medication Assistance  Post Acute Care Choice:  Durable Medical Equipment (Has rw,3n1) Choice offered to:  Patient  DME Arranged:    DME Agency:     HH Arranged:  RN, PT, OT HH Agency:  [facility]  Status of Service:  Completed, signed off  If discussed at [place] of Stay Meetings, dates discussed:    Additional Comments:  Dessa Phi, RN 11/06/2016, 11:50 AM

## 2016-11-06 NOTE — Progress Notes (Signed)
PROGRESS NOTE    Emily Cohen  FMB:[id number] DOB: Jan 09, 1951 DOA: 11/02/2016 PCP: Benito Mccreedy, MD    Brief Narrative:  Emily Cohen is an 66 y.o. female past medical history significant for smoking alcohol abuse gastric ulcer in February is here by EGD last month diagnosed with metastatic cancer of unknown primary on July 20 presents to the ED with a week's history of chest pain and back pain which has progressively gotten worse CT images show acute PE.    Assessment & Plan:   Principal Problem:   Acute pulmonary embolism (HCC) Active Problems:   Metastatic adenocarcinoma ([place])   Essential hypertension   History of gastric ulcer   Metastatic cancer to bone (HCC)   Microcytic hypochromic anemia   Malnutrition of moderate degree   Metastatic disease (HCC)   Pathologic fracture of vertebrae  #1 acute pulmonary embolism Per CT angiogram chest. Likely secondary to malignancy. Lower extremity Dopplers negative for DVT. 2-D echo with a normal EF and no right ventricular strain.  Patient has been transitioned from IV heparin to full dose Lovenox. Avoid NSAIDs. Outpatient follow-up.  #2 hypertension  Blood pressure borderline. Discontinued Norvasc. Decrease clonidine to 0.1 mg daily.  #3 history of gastric ulcer Stable. Avoid NSAIDs. Continue PPI. Follow H&H.  #4 metastatic cancer of unknown primary to bone/metastatic adenocarcinoma Patient status post CT-guided biopsy of right pubic bone lesion per Interventional radiology, Dr. Anselm Pancoast 11/03/2016. Biopsy results consistent with metastatic adenocarcinoma. Patient states pain not well controlled at this time. Patient with PET scan done 10/30/2016 showing widespread hypermetabolic and lytic osseous metastatic disease including pathologic fractures of the left proximal humerus, right 10th rib and left upper manubrium. Right lower lobe nodule. Malignancy. Hypermetabolic right adrenal mass compatible with metastatic disease.  Questionable small metastatic focus in the left pectoralis major muscle. Case discussed with oncology who recommended checking a CEA and CA 27.9 which have been ordered. Some improvement with pain management with increased MS Contin to 30 mg twice a day and MSIR to 30 mg every 4 hours when necessary breakthrough pain. Oncology has assessed patient and feel that patient's metastatic disease is likely secondary to a metastatic breast cancer and lung cancer. MRI of the head has been ordered to complete workup. Patient has also been started on anastrozole. Patient will follow-up with oncology in the outpatient setting on 11/18/2016 at 3:45 PM for further evaluation and management. Appreciate oncology input and recommendations.   #5 anemia Likely anemia of chronic disease. Hemoglobin currently at 8.5. No overt bleeding. Follow H&H and transfuse for hemoglobin less than 7.  #6 hypokalemia Repleted.   DVT prophylaxis: Lovenox Code Status: Full Family Communication: No family at bedside. Disposition Plan: Home with home health once MRI of head has been done and patient stable.    Consultants:   Interventional radiology: Dr. Anselm Pancoast 11/03/2016  Hematology/oncology Dr. Ron Agee  11/06/2016  Procedures:   CT-guided biopsy of right pubic bone lesion per Dr. Anselm Pancoast 11/03/2016  CT angiogram chest 11/02/2016  Chest x-ray 11/02/2016  Lower extremity Dopplers 11/05/2016  2-D echo 11/05/2016  MRI head pending  Antimicrobials:   None   Subjective: Patient complaining of diffuse pain all over with some improvement from yesterday. Patient denies any chest pain. Patient states shortness of breath improved.  Objective: Vitals:   11/05/16 2110 11/06/16 0515 11/06/16 0905 11/06/16 1435  BP: 118/61 (!) 91/53 113/79 (!) 101/58  Pulse: 92 80 79 83  Resp: 18 18  19   Temp: 98.7 F (  37.1 C) 97.9 F (36.6 C)  98.9 F (37.2 C)  TempSrc: Oral Oral  Oral  SpO2: 96% 91%  93%  Weight:      Height:         Intake/Output Summary (Last 24 hours) at 11/06/16 1437 Last data filed at 11/06/16 0550  Gross per 24 hour  Intake              240 ml  Output              400 ml  Net             -160 ml   Filed Weights   11/03/16 0138  Weight: 61.6 kg (135 lb 12.9 oz)    Examination:  General exam: Appears calm and comfortable  Respiratory system: Clear to auscultation. Respiratory effort normal. Cardiovascular system: S1 & S2 heard, RRR. No JVD, murmurs, rubs, gallops or clicks. No pedal edema. Gastrointestinal system: Abdomen is nondistended, soft and nontender. No organomegaly or masses felt. Normal bowel sounds heard. Central nervous system: Alert and oriented. No focal neurological deficits. Extremities: Symmetric 5 x 5 power. Skin: No rashes, lesions or ulcers Psychiatry: Judgement and insight appear normal. Mood & affect appropriate.     Data Reviewed: I have personally reviewed following labs and imaging studies  CBC:  Recent Labs Lab 11/02/16 1433 11/03/16 0433 11/04/16 0101 11/05/16 0025  WBC 15.4* 10.8* 9.5 8.8  HGB 10.1* 9.1* 8.5* 8.5*  HCT 29.8* 27.5* 26.0* 25.8*  MCV 75.1* 77.2* 77.4* 77.9*  PLT 491* 378 390 637*   Basic Metabolic Panel:  Recent Labs Lab 11/02/16 1745 11/03/16 0433 11/05/16 0025 11/06/16 0428  NA 138 140 138 138  K 3.0* 3.9 4.1 3.8  CL 99* 104 103 100*  CO2 25 26 27 29   GLUCOSE 112* 106* 103* 110*  BUN 7 7 11 9   CREATININE 0.61 0.51 0.66 0.61  CALCIUM 10.1 9.5 9.3 10.0   GFR: Estimated Creatinine Clearance: 65.6 mL/min (by C-G formula based on SCr of 0.61 mg/dL). Liver Function Tests:  Recent Labs Lab 11/02/16 1745  AST 16  ALT 12*  ALKPHOS 143*  BILITOT 1.2  PROT 7.6  ALBUMIN 4.1   No results for input(s): LIPASE, AMYLASE in the last 168 hours. No results for input(s): AMMONIA in the last 168 hours. Coagulation Profile: No results for input(s): INR, PROTIME in the last 168 hours. Cardiac Enzymes: No results for  input(s): CKTOTAL, CKMB, CKMBINDEX, TROPONINI in the last 168 hours. BNP (last 3 results) No results for input(s): PROBNP in the last 8760 hours. HbA1C: No results for input(s): HGBA1C in the last 72 hours. CBG: No results for input(s): GLUCAP in the last 168 hours. Lipid Profile: No results for input(s): CHOL, HDL, LDLCALC, TRIG, CHOLHDL, LDLDIRECT in the last 72 hours. Thyroid Function Tests: No results for input(s): TSH, T4TOTAL, FREET4, T3FREE, THYROIDAB in the last 72 hours. Anemia Panel:  Recent Labs  11/04/16 0804  VITAMINB12 881  FOLATE 10.6  RETICCTPCT 4.3*   Sepsis Labs: No results for input(s): PROCALCITON, LATICACIDVEN in the last 168 hours.  No results found for this or any previous visit (from the past 240 hour(s)).       Radiology Studies: No results found.      Scheduled Meds: . anastrozole  1 mg Oral Daily  . cloNIDine  0.1 mg Oral BID  . enoxaparin (LOVENOX) injection  60 mg Subcutaneous Q12H  . feeding supplement (ENSURE ENLIVE)  237 mL Oral  BID BM  . morphine  30 mg Oral Q12H  . nicotine  21 mg Transdermal Daily  . pantoprazole  40 mg Oral BID  . polyethylene glycol  17 g Oral BID  . sucralfate  1 g Oral TID AC & HS  . venlafaxine XR  75 mg Oral Daily   Continuous Infusions:   LOS: 4 days    Time spent: 62 minutes    Shoji Pertuit, MD [facility] Pager [phone]  If 7PM-7AM, please contact night-coverage [email or web address] Password [facility] 11/06/2016, 2:37 PM

## 2016-11-06 NOTE — Care Management Important Message (Signed)
Important Message  Patient Details  Name: Emily Cohen MRN: [id number] Date of Birth: 06-08-1950   Medicare Important Message Given:  Yes    Kerin Salen 11/06/2016, 11:08 AMImportant Message  Patient Details  Name: Emily Cohen MRN: [id number] Date of Birth: 06/13/1950   Medicare Important Message Given:  Yes    Kerin Salen 11/06/2016, 11:08 AM

## 2016-11-06 NOTE — Progress Notes (Signed)
Following pt due to spiritual care consult for Advance Directive.    Pt expressed she does not want to complete advance directive at this time.   Chaplain provided education around advance directive and process for completing.  Pt understands how to contact spiritual care if she wishes to complete advance directive in future.     [facility] / [place], MDiv

## 2016-11-06 NOTE — Progress Notes (Addendum)
During rounding this morning patient was updated on plan to obtain MRI and discharge to home with [facility] after MRI completed. Patient in agreement with plan. Patient returned from MRI around 1715 and was updated that the discharge paper work was being completed and to call her ride. Patient was in agreement but concerned about being able to get her prescriptions, writer called patients preferred pharmacy and discussed her pain medications and cost. Updated patient on cost of medications. Spoke with patients "caregiver" Diane via phone and updated her, per Diane, "I am busy right now and cannot come right this minute it will be a while." Writer informed her that that would be ok the patient does not get charged for another night until midnight. Diane verbalized understanding and said she would follow up later. Upon going in room to discharge patient, Patient stated "I am not going home tonight, charge me whatever y'all want I am not leaving."  MD made aware of patients refusal to leave tonight.

## 2016-11-06 NOTE — Discharge Summary (Signed)
Physician Discharge Summary  Emily Cohen BMW:[id number] DOB: 07-01-1950 DOA: 11/02/2016  PCP: Benito Mccreedy, MD  Admit date: 11/02/2016 Discharge date: 11/06/2016  Time spent: 65 minutes  Recommendations for Outpatient Follow-up:  1. Follow-up with Dr. Jana Hakim, hematology/oncology on 11/18/2016. On follow-up MRI of the brain will need to be followed up upon. Patient may need MRI of the C-spine for further evaluation. Patient's anticoagulation on full dose Lovenox, will need to be followed up upon. Patient's pain management will need to be reassessed and medication doses adjusted accordingly. 2. Follow-up with Benito Mccreedy, MD in 2 weeks. On follow-up patient will need a basic metabolic profile done to follow-up on electrolytes and renal function. Patient will also need a CBC done to follow-up on H&H.   Discharge Diagnoses:  Principal Problem:   Acute pulmonary embolism (HCC) Active Problems:   Metastatic adenocarcinoma ([place])   Essential hypertension   History of gastric ulcer   Metastatic cancer to bone (HCC)   Microcytic hypochromic anemia   Malnutrition of moderate degree   Metastatic disease (HCC)   Pathologic fracture of vertebrae   Discharge Condition: Stable and improved  Diet recommendation: Regular  Filed Weights   11/03/16 0138  Weight: 61.6 kg (135 lb 12.9 oz)    History of present illness:  Per Dr. Ricard Dillon Emily Cohen is a 66 y.o. female with medical history significant of smoking, EtOH abuse in past (nothing for past 8 months), gastric ulcer in Feb that was healed on EGD last month, BRCA s/p mastectomy.  Patient was diagnosed with metastatic cancer of unknown primary on PET-CT scan on the 20th.  Was just told about this today in office visit.  Presented to ED on day of admission with 8 week history of chest pain, back pain, cough.  Worse today.  Nothing makes better or worse.  Symptoms are severe.   ED Course: D.Dimer >20.  CTA chest  confirms segmental PE, also has a new pathologic thoracic compression fracture at T8 and stable pathologic compression fx at T3.  Hospital Course:  #1 acute pulmonary embolism Per CT angiogram chest. Likely secondary to malignancy. Lower extremity Dopplers negative for DVT. 2-D echo with a normal EF and no right ventricular strain.  Patient has been transitioned from IV heparin to full dose Lovenox. Avoid NSAIDs. Outpatient follow-up. Patient will be discharged on full dose Lovenox and is to follow-up with hematology/oncology and outpatient setting.  #2 hypertension  Blood pressure borderline. Discontinued Norvasc. Decreased clonidine to 0.1 mg daily. Outpatient follow-up.  #3 history of gastric ulcer Patient was maintained on a PPI. Patient is to avoid all NSAIDs.   #4 metastatic cancer of unknown primary to bone/metastatic adenocarcinoma Patient status post CT-guided biopsy of right pubic bone lesion per Interventional radiology, Dr. Anselm Pancoast 11/03/2016. Biopsy results consistent with metastatic adenocarcinoma. Patient states pain not well controlled at this time. Patient with PET scan done 10/30/2016 showing widespread hypermetabolic and lytic osseous metastatic disease including pathologic fractures of the left proximal humerus, right 10th rib and left upper manubrium. Right lower lobe nodule. Malignancy. Hypermetabolic right adrenal mass compatible with metastatic disease. Questionable small metastatic focus in the left pectoralis major muscle. Case discussed with oncology who recommended checking a CEA and CA 27.29 which have been ordered and were elevated with a CEA of 483.5 and a CA 27.29 of 859.9. Patient was placed on long-acting MS Contin and MSIR for breakthrough pain and dose was adjusted with some improvement with patient's pain. Patient will be discharged  on MS Contin to 30 mg twice a day and MSIR to 30 mg every 4 hours when necessary breakthrough pain.  Oncology has assessed patient and  feel that patient's metastatic disease is likely secondary to a metastatic breast cancer and lung cancer.  MRI of the head has been ordered to complete workup which showed metastatic disease to the left frontal bone with adjacent intracranial left frontal pachymeningeal enhancement. No brain edema or shift. No parenchymal metastases are observed. Concern for metastatic disease to distal clavus, C2, C4 and 5 MRI of C-spine recommended for further evaluation. Patient has also been started on anastrozole. Patient will follow-up with oncology in the outpatient setting on 11/18/2016 at 3:45 PM for further evaluation and management.   #5 anemia Likely anemia of chronic disease. Hemoglobin currently remained stable at 8.5. No overt bleeding.   #6 hypokalemia Repleted.   Procedures:  CT-guided biopsy of right pubic bone lesion per Dr. Anselm Pancoast 11/03/2016  CT angiogram chest 11/02/2016  Chest x-ray 11/02/2016  Lower extremity Dopplers 11/05/2016  2-D echo 11/05/2016  MRI head 11/06/2016  Consultations:  Interventional radiology: Dr. Anselm Pancoast 11/03/2016  Hematology/oncology Dr. Ron Agee  11/06/2016  Discharge Exam: Vitals:   11/06/16 0905 11/06/16 1435  BP: 113/79 (!) 101/58  Pulse: 79 83  Resp:  19  Temp:  98.9 F (37.2 C)    General: NAD Cardiovascular: RRR Respiratory: CTAB  Discharge Instructions   Discharge Instructions    Diet general    Complete by:  As directed    Increase activity slowly    Complete by:  As directed      Current Discharge Medication List    START taking these medications   Details  anastrozole (ARIMIDEX) 1 MG tablet Take 1 tablet (1 mg total) by mouth daily. Qty: 30 tablet, Refills: 0   Associated Diagnoses: Metastatic cancer to bone (HCC)    enoxaparin (LOVENOX) 60 MG/0.6ML injection Inject 0.6 mLs (60 mg total) into the skin every 12 (twelve) hours. Qty: 60 Syringe, Refills: 0    feeding supplement, ENSURE ENLIVE, (ENSURE ENLIVE) LIQD Take  237 mLs by mouth 2 (two) times daily between meals. Qty: 237 mL, Refills: 12    morphine (MS CONTIN) 30 MG 12 hr tablet Take 1 tablet (30 mg total) by mouth every 12 (twelve) hours. Qty: 60 tablet, Refills: 0    morphine (MSIR) 30 MG tablet Take 1 tablet (30 mg total) by mouth every 4 (four) hours as needed for severe pain. Qty: 20 tablet, Refills: 0    nicotine (NICODERM CQ - DOSED IN MG/24 HOURS) 21 mg/24hr patch Place 1 patch (21 mg total) onto the skin daily. Qty: 28 patch, Refills: 0      CONTINUE these medications which have CHANGED   Details  cloNIDine (CATAPRES) 0.1 MG tablet Take 1 tablet (0.1 mg total) by mouth daily. Qty: 30 tablet, Refills: 0   Associated Diagnoses: Essential hypertension      CONTINUE these medications which have NOT CHANGED   Details  CARAFATE 1 GM/10ML suspension Take 10 mLs (1 g total) by mouth 4 (four) times daily. Qty: 420 mL, Refills: 1    methocarbamol (ROBAXIN) 500 MG tablet Take 500 mg by mouth 2 (two) times daily.    pantoprazole (PROTONIX) 40 MG tablet Take 1 tablet (40 mg total) by mouth 2 (two) times daily. Qty: 60 tablet, Refills: 0    polyethylene glycol powder (GLYCOLAX/MIRALAX) powder Take 2 doses now and then twice daily as needed for constipation Qty:  255 g, Refills: 3   Associated Diagnoses: Constipation, unspecified constipation type    promethazine (PHENERGAN) 25 MG tablet Take 25 mg by mouth every 6 (six) hours.    tiZANidine (ZANAFLEX) 4 MG tablet Take 4-8 mg by mouth every 8 (eight) hours as needed for muscle spasms.    venlafaxine XR (EFFEXOR-XR) 75 MG 24 hr capsule Take 75 mg by mouth daily. Refills: 0    diclofenac sodium (VOLTAREN) 1 % GEL Apply 2 g topically 4 (four) times daily. Apply to right shoulder. Qty: 1 Tube, Refills: 0    senna (SENOKOT) 8.6 MG TABS tablet Take 1 tablet (8.6 mg total) by mouth daily as needed for mild constipation. Qty: 120 each, Refills: 0      STOP taking these medications      amLODipine (NORVASC) 10 MG tablet      HYDROcodone-acetaminophen (NORCO/VICODIN) 5-325 MG tablet        No Known Allergies Follow-up Information    [facility] Follow up.   Specialty:  [place] Why:  [facility] nursing,physical/occupational therapy Contact information: [address] [place] [address] [phone]        Benito Mccreedy, MD. Schedule an appointment as soon as possible for a visit in 2 week(s).   Specialty:  Internal Medicine Contact information: [address] [place] [address] [phone]        Magrinat, Virgie Dad, MD Follow up on 11/18/2016.   Specialty:  Oncology Why:  F/U AT 3:45PM Contact information: [place] [address] [phone]            The results of significant diagnostics from this hospitalization (including imaging, microbiology, ancillary and laboratory) are listed below for reference.    Significant Diagnostic Studies: Dg Chest 2 View  Result Date: 11/02/2016 CLINICAL DATA:  Chronic central chest pain, worsened EXAM: CHEST  2 VIEW COMPARISON:  Head CT 10/30/2016, radiograph 10/04/2016, CT 10/09/2016 FINDINGS: No acute pulmonary infiltrate, consolidation, or pleural effusion is seen. Surgical clips over the left chest. Post mastectomy changes on the left. Stable cardiomediastinal silhouette. Stable compression deformities of the thoracic and lumbar spine. Partially visualized left proximal humerus fracture. Possible healing left tenth rib fracture. Right tenth rib fracture. IMPRESSION: 1. No acute infiltrate or edema. 2. Stable appearance of spinal compression deformities. Acute right tenth rib fracture. Possible healing left tenth rib fracture. Electronically Signed   By: Donavan Foil M.D.   On: 11/02/2016 15:28   Ct Chest W Contrast  Result Date: 10/09/2016 CLINICAL DATA:  Chronic cough, smoker, hypertension, history LEFT breast cancer, alcohol abuse,  GERD, lytic lesions within the lumbar spine on recent CT abdomen and pelvis exam, question lung cancer due to smoking history EXAM: CT CHEST WITH CONTRAST TECHNIQUE: Multidetector CT imaging of the chest was performed during intravenous contrast administration. Sagittal and coronal MPR images reconstructed from axial data set. CONTRAST:  18m ISOVUE-300 IOPAMIDOL (ISOVUE-300) INJECTION 61% IV COMPARISON:  None ; correlation chest radiograph 10/04/2016 FINDINGS: Cardiovascular: Atherosclerotic calcifications aorta and minimally in the coronary arteries. Aorta normal caliber. Aberrant origin of the RIGHT subclavian artery arising last from aortic arch and coursing posterior to the esophagus. Thoracic vascular structures grossly patent on nondedicated exam. No pericardial effusion. Mediastinum/Nodes: Esophagus unremarkable. Base of cervical region normal appearance. LEFT breast prosthesis. No thoracic adenopathy. Lungs/Pleura: Minimal RIGHT pleural effusion and dependent atelectasis of the posterior RIGHT lower lobe. Medial RIGHT lower lobe nodule 10 x 9 mm image  60, minimally irregular. Questionable tiny RIGHT upper lobe nodules image 19. No additional mass, nodule, infiltrate or pneumothorax. Upper Abdomen: Small cyst upper pole LEFT kidney. Remaining visualized upper abdomen unremarkable. Musculoskeletal: Bones appear demineralized. Compression fractures at T3, superior endplate T5, T8, and X54. Potential underlying lytic lesions at T3 and T8. IMPRESSION: Multiple thoracic spine compression fractures with question associated lytic lesions/pathologic fractures at T3 and T8. 10 x 9 mm slightly irregular medial RIGHT lower lobe nodule, could represent a primary or metastatic lesion. Tiny nonspecific RIGHT upper lobe nodular foci. Aortic Atherosclerosis (ICD10-I70.0). Electronically Signed   By: Lavonia Dana M.D.   On: 10/09/2016 15:04   Ct Angio Chest Pe W And/or Wo Contrast  Result Date: 11/02/2016 CLINICAL DATA:   66 year old female with chronic chest wall pain and lung cancer EXAM: CT ANGIOGRAPHY CHEST WITH CONTRAST TECHNIQUE: Multidetector CT imaging of the chest was performed using the standard protocol during bolus administration of intravenous contrast. Multiplanar CT image reconstructions and MIPs were obtained to evaluate the vascular anatomy. CONTRAST:  100 mL Isovue 370 COMPARISON:  Recent prior imaging including PET-CT 11/02/2016 ; prior chest CT 10/09/2016 FINDINGS: Cardiovascular: Aberrant right subclavian artery. No evidence of aortic aneurysm. Adequate opacification of the pulmonary arteries to the segmental level. Positive for a small filling defects within the postero basal segmental right lower lobe pulmonary artery. No additional pulmonary emboli are identified. The heart is normal in size. No pericardial effusion. Mediastinum/Nodes: Unremarkable CT appearance of the thyroid gland. No suspicious mediastinal or hilar adenopathy. No soft tissue mediastinal mass. The thoracic esophagus is unremarkable. Lungs/Pleura: 1 cm peripherally spiculated nodule in the right lower lobe. Mild dependent atelectasis in the right lower lobe. No new focal airspace consolidation, pleural effusion, pneumothorax or pulmonary edema. Upper Abdomen: The visualized portion of the upper abdomen is unremarkable. Musculoskeletal: Surgical changes of prior left mastectomy and breast reconstruction. Stable T3 compression fracture. Compression fracture involving the inferior endplate of T8 is new compared to 10/09/2016. Nondisplaced pathologic fracture through the left aspect of the manubrium again identified. Incompletely imaged right tenth rib fracture. Known multifocal osseous metastatic disease is better demonstrated on the recent prior PET-CT. Review of the MIP images confirms the above findings. IMPRESSION: 1. Positive for isolated segmental pulmonary embolus in the right lower lobe postero basal segmental pulmonary artery. The  solitary small pulmonary embolus may or may not be contributing to the patient's described clinical symptoms. 2. New T8 compression fracture compared to the CT scan of the chest dated 10/09/2016. 3. Stable T3 compression fracture, right tenth rib a pathologic fracture and manubrial pathologic fracture. 4. Multifocal osseous metastatic disease better demonstrated on recent PET-CT. 5. Additional ancillary findings as above without interval change over the last 3 days. Electronically Signed   By: Jacqulynn Cadet M.D.   On: 11/02/2016 20:46   Mr Jeri Cos MG Contrast  Result Date: 11/06/2016 CLINICAL DATA:  Metastatic cancer, unknown primary. Headache. Acute pulmonary embolus. EXAM: MRI HEAD WITHOUT AND WITH CONTRAST TECHNIQUE: Multiplanar, multiecho pulse sequences of the brain and surrounding structures were obtained without and with intravenous contrast. CONTRAST:  33m MULTIHANCE GADOBENATE DIMEGLUMINE 529 MG/ML IV SOLN COMPARISON:  CT head 09/22/2015. FINDINGS: Brain: No evidence for acute infarction, hemorrhage, mass lesion, hydrocephalus, or extra-axial fluid. Borderline cerebral and cerebellar atrophy. Mild subcortical and periventricular T2 and FLAIR hyperintensities, likely chronic microvascular ischemic change. Post infusion, there is abnormal pachymeningeal enhancement over the LEFT frontal region adjacent to an area of abnormal marrow signal, concerning for  metastatic disease. No significant edema in the adjacent frontal cortex. No parenchymal metastases are observed. Vascular: Flow voids are maintained throughout the carotid, basilar, and vertebral arteries. There are no areas of chronic hemorrhage. Skull and upper cervical spine: In the LEFT frontal bone, there is an area of osseous destruction, and abnormal signal, proximally is 7 x 20 mm cross-section as seen on series 7, image 18. This is adjacent to the area of abnormal dural enhancement. No other definite areas of calvarial disease. There is  abnormal marrow signal in the distal clivus, suspected subcentimeter foci within C2, and there is abnormal signal replacing much of C4, and possibly C5. MRI of the cervical spine is recommended for further evaluation. No definite epidural tumor. Sinuses/Orbits: Minor paranasal sinus disease.  No orbital findings. Other: None. Compared with prior CT, the LEFT frontal bone was normal   in 2017. 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 No brain edema or shift. No parenchymal metastases are observed. Concern for metastatic disease to the distal clivus, C2, C4, and C5. MRI of the cervical spine recommended for further evaluation. Borderline atrophy.  Mild small vessel disease. A call has been placed to the ordering provider. Electronically Signed   By: Staci Righter M.D.   On: 11/06/2016 17:28   Nm Pet Image Initial (pi) Skull Base To Thigh  Result Date: 10/30/2016 CLINICAL DATA:  Initial treatment strategy for right lower lobe lung nodule. Remote history of breast cancer. Findings of osseous metastatic disease. EXAM: NUCLEAR MEDICINE PET SKULL BASE TO THIGH TECHNIQUE: 6.4 mCi F-18 FDG was injected intravenously. Full-ring PET imaging was performed from the skull base to thigh after the radiotracer. CT data was obtained and used for attenuation correction and anatomic localization. FASTING BLOOD GLUCOSE:  Value: 119 mg/dl COMPARISON:  Chest CT from 10/09/2016 FINDINGS: NECK Oval-shaped subcutaneous lesion just below the right submandibular gland measures 1.7 by 1.6 cm on image 34/4 and has no accentuated metabolic activity. CHEST Focal interstitial accentuation with localized air cyst in the right upper lobe, image 20/7, maximum SUV 1.6. The medial right lower lobe lesion measures 9 mm in diameter on image 29/7 and has maximum standard uptake value of 3.3. There is some faint peripheral ground-glass opacity in the superior segment  right lower lobe posteriorly with very low-grade accentuated activity. Probably inflammatory. Aberrant right subclavian artery passes behind the esophagus. Aortic atherosclerotic calcification. ABDOMEN/PELVIS Hypermetabolic right adrenal mass 1.6 by 2.7 cm on image 97/4, maximum SUV 10.6. Focal calcifications in the pancreatic head, similar to 10/01/16 but increased from 05/23/2012. Maximum SUV in this region is 3.4 compared to typical pancreatic parenchymal activity of 2.1. Aortoiliac atherosclerotic vascular disease. SKELETON Widespread osseous metastatic disease. Index lesion involving the left glenoid and coracoid has a maximum SUV of 10.6. Index lesion in the anterior wall of the right acetabulum 12.5. Focal activity in the left pectoralis major muscle, maximum SUV 5.2, compatible with small metastatic deposit or possibly physiologic activity. Likely pathologic fracture the left proximal humerus. The most part the lesions are either lytic or indistinct on the CT data. Pathologic fracture of the right tenth rib. Suspected pathologic fracture of the left upper sternal manubrium. IMPRESSION: 1. Widespread hypermetabolic and lytic osseous metastatic disease, including pathologic fractures the left proximal humerus, right tenth rib, and left upper manubrium. 2. The right lower lobe nodule has a maximum standard uptake value of 3.3, favoring malignancy. 3. Hypermetabolic right adrenal mass, compatible with metastatic disease.  4. Subtle accentuated activity in the pancreatic head adjacent to chronic calcifications in this region. This could reflect low grade inflammation in setting of chronic calcific pancreatitis. Calcified pancreatic mass is considered less likely given the somewhat chronic appearance. 5. Questionable small metastatic focus in the left pectoralis major muscle. 6. Aortoiliac atherosclerotic vascular disease. Aberrant right subclavian artery passes behind the esophagus. Electronically Signed   By:  Van Clines M.D.   On: 10/30/2016 13:35   Ct Biopsy  Result Date: 11/03/2016 INDICATION: 66 year old with the diffuse metastatic bone disease and history of breast cancer. Patient also has a concerning right lung nodule and right adrenal lesion. Tissue diagnosis is needed. Recent diagnosis of pulmonary embolism. IV heparin was stopped prior to the procedure. EXAM: CT-GUIDED BIOPSY OF RIGHT PUBIC BONE LESION MEDICATIONS: None. ANESTHESIA/SEDATION: Moderate (conscious) sedation was employed during this procedure. A total of Versed 4.0 mg and Fentanyl 100 mcg was administered intravenously. Moderate Sedation Time: 18 minutes. The patient's level of consciousness and vital signs were monitored continuously by radiology nursing throughout the procedure under my direct supervision. FLUOROSCOPY TIME:  None COMPLICATIONS: None immediate. PROCEDURE: Informed written consent was obtained from the patient after a thorough discussion of the procedural risks, benefits and alternatives. All questions were addressed. A timeout was performed prior to the initiation of the procedure. Patient was placed supine on the CT scanner. Images of the pelvis were obtained. The destructive and lucent lesion in the anterior right pubic bone was targeted. Anterior pelvis was prepped and draped in sterile fashion. Skin and soft tissues were anesthetized with 1% lidocaine. 54 gauge needle was directed into the anterior aspect of the lesion with CT guidance. A total of 4 fine-needle aspirations were performed with [place] and [place] needles. Small core samples were actually obtained from the aspirate needles along with aspirate material. Aspiration was also obtained from the 17 gauge needle as it was removed. Bandage placed over the puncture site. FINDINGS: Lucent lesion involving the anterior right pubic bone with some bone destruction along the anterior cortex. Biopsy needles were confirmed within the lucent lesion. No complicating  features on the post biopsy imaging. IMPRESSION: CT-guided biopsy of the right pubic bone lesion. Electronically Signed   By: Markus Daft M.D.   On: 11/03/2016 17:11    Microbiology: No results found for this or any previous visit (from the past 240 hour(s)).   Labs: Basic Metabolic Panel:  Recent Labs Lab 11/02/16 1745 11/03/16 0433 11/05/16 0025 11/06/16 0428  NA 138 140 138 138  K 3.0* 3.9 4.1 3.8  CL 99* 104 103 100*  CO2 _0 GLUCOSE 112* 106* 103* 110*  BUN _1 CREATININE 0.61 0.51 0.66 0.61  CALCIUM 10.1 9.5 9.3 10.0   Liver Function Tests:  Recent Labs Lab 11/02/16 1745  AST 16  ALT 12*  ALKPHOS 143*  BILITOT 1.2  PROT 7.6  ALBUMIN 4.1   No results for input(s): LIPASE, AMYLASE in the last 168 hours. No results for input(s): AMMONIA in the last 168 hours. CBC:  Recent Labs Lab 11/02/16 1433 11/03/16 0433 11/04/16 0101 11/05/16 0025  WBC 15.4* 10.8* 9.5 8.8  HGB 10.1* 9.1* 8.5* 8.5*  HCT 29.8* 27.5* 26.0* 25.8*  MCV 75.1* 77.2* 77.4* 77.9*  PLT 491* 378 390 403*   Cardiac Enzymes: No results for input(s): CKTOTAL, CKMB, CKMBINDEX, TROPONINI in the last 168 hours. BNP: BNP (last 3 results) No results for input(s): BNP in the last  8760 hours.  ProBNP (last 3 results) No results for input(s): PROBNP in the last 8760 hours.  CBG: No results for input(s): GLUCAP in the last 168 hours.     SignedIrine Seal MD.  [facility] 11/06/2016, 5:30 PM

## 2016-11-06 NOTE — Care Management Note (Signed)
Case Management Note  Patient Details  Name: Emily Cohen MRN: [id number] Date of Birth: Jul 11, 1950  Subjective/Objective: Faxed w/confirmation to [facility] orders,[id number],demographic sheet.No further CM needs.                   Action/Plan:d/c home w/HHC.   Expected Discharge Date:                  Expected Discharge Plan:  [name]  In-House Referral:     Discharge planning Services  CM Consult, Medication Assistance  Post Acute Care Choice:  Durable Medical Equipment (Has rw,3n1) Choice offered to:  Patient  DME Arranged:    DME Agency:     HH Arranged:  RN, PT, OT HH Agency:  [facility]  Status of Service:  Completed, signed off  If discussed at [place] of Stay Meetings, dates discussed:    Additional Comments:  Dessa Phi, RN 11/06/2016, 3:43 PM

## 2016-11-07 NOTE — Progress Notes (Signed)
Pt agitated upon DC. Pt saw PCP rounding at hospital and wanted to know why he wouldn't stop in to check her. Informed pt that she is assigned different MD while at hospital. Pt became agitated. NT heard pt tell friend "they better get ready because im going to sue". When doing DC teaching pt made statement that she had trouble paying for medication, last DC. Offered to have case Freight forwarder or social worker come in to speak with pt about options. Pt refused multiple times stating "i dont have time for that. If I cant get my medication,ill be right back in here." Pt refusing assistance at this time. Pt and friend, at beside, attentive during discharge teaching. Pt able to teach back how to take medication at home. Pt verbalizes understanding of discharge teaching. No further questions or concerns at this time. Pt left with no signs/symptoms pain or discomfort.

## 2016-11-07 NOTE — Progress Notes (Signed)
[place] for Lovenox Indication: pulmonary embolus  No Known Allergies  Patient Measurements: Height: 5\' 6"  (167.6 cm) Weight: 135 lb 12.9 oz (61.6 kg) IBW/kg (Calculated) : 59.3 Heparin Dosing Weight: 61.2 kg  Vital Signs: Temp: 97.6 F (36.4 C) (07/28 0503) Temp Source: Oral (07/28 0503) BP: 105/61 (07/28 0503) Pulse Rate: 84 (07/28 0503)  Labs:  Recent Labs  11/05/16 0025 11/06/16 0428  HGB 8.5*  --   HCT 25.8*  --   PLT 403*  --   CREATININE 0.66 0.61    Estimated Creatinine Clearance: 65.6 mL/min (by C-G formula based on SCr of 0.61 mg/dL).   Assessment: 66 yo F with metastatic lung cancer and new PE.  Pharmacy consulted to dose heparin.  Using heparin so IR can perform biopsy, then plan transition to [facility] for long-term anticoagulation.  11/07/2016:  s/p bone biopsy   Hg 8.5-low, but stable.  PLTC WNL  no bleeding reported.    Renal function stable  Goal of Therapy:   Heparin level 0.3 - 0.7 Monitor platelets by anticoagulation protocol: Yes   Plan: Lovenox 60mg  sq BID Monitor for s/sx of bleeding CBC in am if patient not discharge.   No further dose adjustments anticipated.  Pharmacy to sign off.   Netta Cedars, PharmD, BCPS Pager: [phone] 11/07/2016 8:47 AM

## 2016-11-07 NOTE — Progress Notes (Signed)
Pt dc with all belongings. Pt stated " I got everything".

## 2016-11-09 ENCOUNTER — Telehealth: Payer: Self-pay | Admitting: Internal Medicine

## 2016-11-09 NOTE — Telephone Encounter (Signed)
Spoke with the pt and notified that since she already had a biopsy as an inpatient, that no further follow up in this office is needed. Pt verbalized understanding of this and denied any questions or concerns.

## 2016-11-10 ENCOUNTER — Telehealth: Payer: Self-pay | Admitting: Oncology

## 2016-11-10 NOTE — Telephone Encounter (Signed)
sw pt to confirm 8/8 appt at 330 per sch msg

## 2016-11-15 ENCOUNTER — Other Ambulatory Visit: Payer: Self-pay | Admitting: Oncology

## 2016-11-15 NOTE — Progress Notes (Signed)
[place]  [phone] [phone]     ID: Emily Cohen DOB: 08-21-1950  MR#: [id number]  RSW#:[id number]  Patient Care Team: Benito Mccreedy, MD as PCP - General (Internal Medicine) Magrinat, Virgie Dad, MD as Consulting Physician (Oncology) Mickeal Skinner Acey Lav, MD as Consulting Physician (Psychiatry) Milus Banister, MD as Attending Physician (Gastroenterology) Chauncey Cruel, MD OTHER MD:  CHIEF COMPLAINT: Estrogen receptor positive stage IV breast cancer  CURRENT TREATMENT: Considering brain irradiation  INTERVAL HISTORY: Emily Cohen returns today for follow-up of her metastatic breast cancer, accompanied by her caregiver Emily Cohen. Since her last visit here we obtained the prognostic panel from her fine-needle aspiration of the bone lesion. Note that this did not require the calcification. This shows a triple negative tumor, with no estrogen and progesterone receptor expression and no HER-2 amplification. This is not good news as it significantly limits our choices in terms of systemic treatment.  We also reviewed the results of her brain MRI, which shows early pachymeningeal involvement. I have reviewed the MRI with neuro-oncology and it was Dr. Renda Rolls impression that the patient might benefit from a brief course of radiation to the affected area. Emily Cohen is interested in this approach, whereas she is very negative towards consideration of chemotherapy.  Finally we spent a good deal of the visit today going over her medications and specifically pain and bowel prophylaxis issues.  REVIEW OF SYSTEMS: Emily Cohen is currently on MS Contin 30 mg twice daily. She has MSIR 30 mg as needed. This is not holding her pain and she is in pain all the time. She finds Robaxin helpful but for some reason her insurance has refused to refill this. The patient was not able to afford MiraLAX or Senokot, she says, because these are over-the-counter. She says she now has  better insurance and should be able to obtain these. As a result she is moderately constipated and this adds to her discomfort. She has been having some headaches, but no visual changes, nausea, or vomiting. She is generally weak and very limited in what she can do at home. Some of this is of course chronic and some of this is related to her new situation.   HISTORY OF CURRENT ILLNESS: From the 11/06/2016 initial consult note:  Emily Cohen tells me she had breast cancer "in her twenties." She had a left mastectomy with implant placement, no radiation or antiestrogens and no chemotherapy. We have no records available to review this but it is consistent with her exam.  More recently she has been having increasing chest wall pain. Dr Vista Lawman set her up for plain rib fims which were negative, then a chest CT 10/09/2016 which showed a 0.9 cm RLL lesion and multiple bone lesions including multiple compression fractures. PET scan 10/31/2015 confirmed above and showed in addition a left chest wall lesion and right adrenal mass.   The patient was admitted with poorly controlled pain 11/02/2016 and CT angio of the chest documented a RLL PE. .)n 11/03/2016 right pubic ramus biopsy showed metastatic adenocarcinoma ([id number]). We have requested a prognostic panel. We also requested tumor markers, and the patient's CA 27-29 is very elevated at 859.9. This tell s [place] we are dealing with metastatic breast cancer, not an adenocarcinoma from another site.  The patient's subsequent history is as detailed below.   PAST MEDICAL HISTORY: Past Medical History:  Diagnosis Date  . Alcohol abuse 2014  . Anxiety   . Arthritis    "maybe in my legs" (  10/07/2016)  . Breast cancer, left breast ([place]) <2002  . Chronic back pain   . Depression   . Gastric ulcer 05/2016   caused hematemesis.  GU with VV cauterized at EGD.  using NSAIDs.  biopsy negative for H Pylori.    Marland Kitchen GERD (gastroesophageal reflux disease)   .  Hypertension   . Left humeral fracture 01/2016   did not follow up with ortho as planned  . Pancreatitis 2014   likely due to ETOH  . Pre-diabetes     PAST SURGICAL HISTORY: Past Surgical History:  Procedure Laterality Date  . ABDOMINAL HYSTERECTOMY    . BREAST BIOPSY Left <2002  . COLONOSCOPY N/A 10/08/2016   Procedure: COLONOSCOPY;  Surgeon: Irene Shipper, MD;  Location: [facility] ENDOSCOPY;  Service: Endoscopy;  Laterality: N/A;  . ESOPHAGOGASTRODUODENOSCOPY N/A 05/23/2016   Procedure: ESOPHAGOGASTRODUODENOSCOPY (EGD);  Surgeon: Milus Banister, MD;  Location: [place];  Service: Endoscopy;  Laterality: N/A;  . ESOPHAGOGASTRODUODENOSCOPY  05/2016   Dr Owens Loffler.  for hematemesis.  cauterized a large GU, gastritis noted.  H pylori negativ on biopsy  . ESOPHAGOGASTRODUODENOSCOPY N/A 10/08/2016   Procedure: ESOPHAGOGASTRODUODENOSCOPY (EGD);  Surgeon: Irene Shipper, MD;  Location: [facility] ENDOSCOPY;  Service: Endoscopy;  Laterality: N/A;  . MASTECTOMY Left <2002   for cancer  . TONSILLECTOMY      FAMILY HISTORY Family History  Problem Relation Age of Onset  . Hypertension Mother   . Diabetes Mother   . Diabetes Father   . Colon cancer Neg Hx   . Rectal cancer Neg Hx   . Esophageal cancer Neg Hx   The patient's parents died from complications of DM. The patient had 3 brothers, no sisters. The only breast cancer in the family was one aunt diagnosed in her 63's. There is no history of ovarian cancer in the family.   GYNECOLOGIC HISTORY:  No LMP recorded. Patient is postmenopausal. Menarche 72, she is GX P0. Stopped having periods in her 20's. Did not receive HR.  SOCIAL HISTORY:  Retired, worked at [employer] and other jobs. Separated. Lives with her SO, Remo Lipps, who is retired from [employer]. She has an aide who comes to the house daily and helps with cleaning and cooking.    ADVANCED DIRECTIVES: Discussed but not finalized at the 11/18/2016 visit   HEALTH  MAINTENANCE: Social History  Substance Use Topics  . Smoking status: Current Every Day Smoker    Packs/day: 1.00    Years: 47.00    Types: Cigarettes  . Smokeless tobacco: Never Used  . Alcohol use Yes     Comment: 10/07/2016 "nothing in 7 months; heavy drinker before that"     Colonoscopy: 10/08/2016; repeat 2021, Ardis Hughs  PAP:  Bone density:   No Known Allergies  Current Outpatient Prescriptions  Medication Sig Dispense Refill  . anastrozole (ARIMIDEX) 1 MG tablet Take 1 tablet (1 mg total) by mouth daily. 30 tablet 0  . CARAFATE 1 GM/10ML suspension Take 10 mLs (1 g total) by mouth 4 (four) times daily. 420 mL 1  . cloNIDine (CATAPRES) 0.1 MG tablet Take 1 tablet (0.1 mg total) by mouth daily. 30 tablet 0  . diclofenac sodium (VOLTAREN) 1 % GEL Apply 2 g topically 4 (four) times daily. Apply to right shoulder. (Patient taking differently: Apply 2 g topically 4 (four) times daily as needed (for right shoulder pain). ) 1 Tube 0  . enoxaparin (LOVENOX) 60 MG/0.6ML injection Inject 0.6 mLs (60 mg total)  into the skin every 12 (twelve) hours. 60 Syringe 0  . feeding supplement, ENSURE ENLIVE, (ENSURE ENLIVE) LIQD Take 237 mLs by mouth 2 (two) times daily between meals. 237 mL 12  . methocarbamol (ROBAXIN) 500 MG tablet Take 500 mg by mouth 2 (two) times daily.    Marland Kitchen morphine (MS CONTIN) 30 MG 12 hr tablet Take 1 tablet (30 mg total) by mouth every 12 (twelve) hours. 60 tablet 0  . morphine (MSIR) 30 MG tablet Take 1 tablet (30 mg total) by mouth every 4 (four) hours as needed for severe pain. 20 tablet 0  . nicotine (NICODERM CQ - DOSED IN MG/24 HOURS) 21 mg/24hr patch Place 1 patch (21 mg total) onto the skin daily. 28 patch 0  . pantoprazole (PROTONIX) 40 MG tablet Take 1 tablet (40 mg total) by mouth 2 (two) times daily. (Patient taking differently: Take 40 mg by mouth daily. ) 60 tablet 0  . polyethylene glycol powder (GLYCOLAX/MIRALAX) powder Take 2 doses now and then twice daily as  needed for constipation (Patient taking differently: Take 1 Container by mouth daily. ) 255 g 3  . promethazine (PHENERGAN) 25 MG tablet Take 25 mg by mouth every 6 (six) hours.    . senna (SENOKOT) 8.6 MG TABS tablet Take 1 tablet (8.6 mg total) by mouth daily as needed for mild constipation. 120 each 0  . tiZANidine (ZANAFLEX) 4 MG tablet Take 4-8 mg by mouth every 8 (eight) hours as needed for muscle spasms.    Marland Kitchen venlafaxine XR (EFFEXOR-XR) 75 MG 24 hr capsule Take 75 mg by mouth daily.  0   No current facility-administered medications for this visit.     OBJECTIVE: Middle-aged African-American woman who appears older than stated age.  Note that EPIC was down at the time of this visit and her vital signs will be entered subsequently  There were no vitals filed for this visit.   There is no height or weight on file to calculate BMI.   Wt Readings from Last 3 Encounters:  11/03/16 135 lb 12.9 oz (61.6 kg)  10/29/16 135 lb (61.2 kg)  10/18/16 140 lb (63.5 kg)      ECOG FS:3 - Symptomatic, >50% confined to bed  Ocular: Sclerae unicteric, pupils round and equal Ear-nose-throat: Oropharynx clear, somewhat dry Lymphatic: No cervical or supraclavicular adenopathy Lungs no rales or rhonchi Heart regular rate and rhythm Abd soft, distended,, positive bowel sounds Neuro: non-focal, well-oriented, appropriate affect Breasts: Deferred   LAB RESULTS:  CMP     Component Value Date/Time   NA 138 11/06/2016 0428   K 3.8 11/06/2016 0428   CL 100 (L) 11/06/2016 0428   CO2 29 11/06/2016 0428   GLUCOSE 110 (H) 11/06/2016 0428   BUN 9 11/06/2016 0428   CREATININE 0.61 11/06/2016 0428   CALCIUM 10.0 11/06/2016 0428   PROT 7.6 11/02/2016 1745   ALBUMIN 4.1 11/02/2016 1745   AST 16 11/02/2016 1745   ALT 12 (L) 11/02/2016 1745   ALKPHOS 143 (H) 11/02/2016 1745   BILITOT 1.2 11/02/2016 1745   GFRNONAA >60 11/06/2016 0428   GFRAA >60 11/06/2016 0428    No results found for:  TOTALPROTELP, ALBUMINELP, A1GS, A2GS, BETS, BETA2SER, GAMS, MSPIKE, SPEI  No results found for: KPAFRELGTCHN, LAMBDASER, KAPLAMBRATIO  Lab Results  Component Value Date   WBC 8.8 11/05/2016   NEUTROABS 11.9 (H) 10/04/2016   HGB 8.5 (L) 11/05/2016   HCT 25.8 (L) 11/05/2016   MCV 77.9 (L) 11/05/2016  PLT 403 (H) 11/05/2016      Chemistry      Component Value Date/Time   NA 138 11/06/2016 0428   K 3.8 11/06/2016 0428   CL 100 (L) 11/06/2016 0428   CO2 29 11/06/2016 0428   BUN 9 11/06/2016 0428   CREATININE 0.61 11/06/2016 0428      Component Value Date/Time   CALCIUM 10.0 11/06/2016 0428   ALKPHOS 143 (H) 11/02/2016 1745   AST 16 11/02/2016 1745   ALT 12 (L) 11/02/2016 1745   BILITOT 1.2 11/02/2016 1745        Lab Results  Component Value Date   CA2729 859.9 (H) 11/05/2016    No components found for: HGQUANT  Lab Results  Component Value Date   CEA1 483.5 (H) 11/05/2016   /  CEA  Date Value Ref Range Status  11/05/2016 483.5 (H) 0.0 - 4.7 ng/mL Final    Comment:    (NOTE)       Roche ECLIA methodology       Nonsmokers  <3.9                                     Smokers     <5.6 Performed At: [facility] [address] [place], [place] [id number] Mila Homer MD OK:[id number]      No visits with results within 3 Day(s) from this visit.  Latest known visit with results is:  Admission on 11/02/2016, Discharged on 11/07/2016  Component Date Value Ref Range Status  . WBC 11/02/2016 15.4* 4.0 - 10.5 K/uL Final  . RBC 11/02/2016 3.97  3.87 - 5.11 MIL/uL Final  . Hemoglobin 11/02/2016 10.1* 12.0 - 15.0 g/dL Final  . HCT 32/83/2999 29.8* 36.0 - 46.0 % Final  . MCV 11/02/2016 75.1* 78.0 - 100.0 fL Final  . MCH 11/02/2016 25.4* 26.0 - 34.0 pg Final  . MCHC 11/02/2016 33.9  30.0 - 36.0 g/dL Final  . RDW 92/12/4988 21.1* 11.5 - 15.5 % Final  . Platelets 11/02/2016 491* 150 - 400 K/uL Final  . Sodium 11/02/2016 138  135 - 145 mmol/L Final  .  Potassium 11/02/2016 3.0* 3.5 - 5.1 mmol/L Final  . Chloride 11/02/2016 99* 101 - 111 mmol/L Final  . CO2 11/02/2016 25  22 - 32 mmol/L Final  . Glucose, Bld 11/02/2016 112* 65 - 99 mg/dL Final  . BUN 83/24/7803 7  6 - 20 mg/dL Final  . Creatinine, Ser 11/02/2016 0.61  0.44 - 1.00 mg/dL Final  . Calcium 75/07/2376 10.1  8.9 - 10.3 mg/dL Final  . Total Protein 11/02/2016 7.6  6.5 - 8.1 g/dL Final  . Albumin 27/69/7044 4.1  3.5 - 5.0 g/dL Final  . AST 44/83/1279 16  15 - 41 U/L Final  . ALT 11/02/2016 12* 14 - 54 U/L Final  . Alkaline Phosphatase 11/02/2016 143* 38 - 126 U/L Final  . Total Bilirubin 11/02/2016 1.2  0.3 - 1.2 mg/dL Final  . GFR calc non Af Amer 11/02/2016 >60  >60 mL/min Final  . GFR calc Af Amer 11/02/2016 >60  >60 mL/min Final   Comment: (NOTE) The eGFR has been calculated using the CKD EPI equation. This calculation has not been validated in all clinical situations. eGFR's persistently <60 mL/min signify possible Chronic Kidney Disease.   . Anion gap 11/02/2016 14  5 - 15 Final  . D-Dimer, Quant 11/02/2016 >20.00* 0.00 -  0.50 ug/mL-FEU Final   Comment: REPEATED TO VERIFY (NOTE) At the manufacturer cut-off of 0.50 ug/mL FEU, this assay has been documented to exclude PE with a sensitivity and negative predictive value of 97 to 99%.  At this time, this assay has not been approved by the FDA to exclude DVT/VTE. Results should be correlated with clinical presentation.   . Troponin i, poc 11/02/2016 0.00  0.00 - 0.08 ng/mL Final  . Comment 3 11/02/2016          Final   Comment: Due to the release kinetics of cTnI, a negative result within the first hours of the onset of symptoms does not rule out myocardial infarction with certainty. If myocardial infarction is still suspected, repeat the test at appropriate intervals.   . Sodium 11/03/2016 140  135 - 145 mmol/L Final  . Potassium 11/03/2016 3.9  3.5 - 5.1 mmol/L Final   Comment: DELTA CHECK NOTED NO VISIBLE  HEMOLYSIS   . Chloride 11/03/2016 104  101 - 111 mmol/L Final  . CO2 11/03/2016 26  22 - 32 mmol/L Final  . Glucose, Bld 11/03/2016 106* 65 - 99 mg/dL Final  . BUN 11/03/2016 7  6 - 20 mg/dL Final  . Creatinine, Ser 11/03/2016 0.51  0.44 - 1.00 mg/dL Final  . Calcium 11/03/2016 9.5  8.9 - 10.3 mg/dL Final  . GFR calc non Af Amer 11/03/2016 >60  >60 mL/min Final  . GFR calc Af Amer 11/03/2016 >60  >60 mL/min Final   Comment: (NOTE) The eGFR has been calculated using the CKD EPI equation. This calculation has not been validated in all clinical situations. eGFR's persistently <60 mL/min signify possible Chronic Kidney Disease.   . Anion gap 11/03/2016 10  5 - 15 Final  . WBC 11/03/2016 10.8* 4.0 - 10.5 K/uL Final  . RBC 11/03/2016 3.56* 3.87 - 5.11 MIL/uL Final  . Hemoglobin 11/03/2016 9.1* 12.0 - 15.0 g/dL Final  . HCT 11/03/2016 27.5* 36.0 - 46.0 % Final  . MCV 11/03/2016 77.2* 78.0 - 100.0 fL Final  . MCH 11/03/2016 25.6* 26.0 - 34.0 pg Final  . MCHC 11/03/2016 33.1  30.0 - 36.0 g/dL Final  . RDW 11/03/2016 21.3* 11.5 - 15.5 % Final  . Platelets 11/03/2016 378  150 - 400 K/uL Final  . Iron 11/03/2016 63  28 - 170 ug/dL Final  . TIBC 11/03/2016 266  250 - 450 ug/dL Final  . Saturation Ratios 11/03/2016 24  10.4 - 31.8 % Final  . UIBC 11/03/2016 203  ug/dL Final   Performed at [facility], [place] [address] [place], [place] [address]  . Ferritin 11/03/2016 150  11 - 307 ng/mL Final   Performed at [facility], [place] [address] [place], [place] [address]  . Heparin Unfractionated 11/03/2016 <0.10* 0.30 - 0.70 IU/mL Final   Comment:        IF HEPARIN RESULTS ARE BELOW EXPECTED VALUES, AND PATIENT DOSAGE HAS BEEN CONFIRMED, SUGGEST FOLLOW UP TESTING OF ANTITHROMBIN III LEVELS.   Marland Kitchen Heparin Unfractionated 11/04/2016 0.75* 0.30 - 0.70 IU/mL Final   Comment:        IF HEPARIN RESULTS ARE BELOW EXPECTED VALUES, AND PATIENT DOSAGE HAS BEEN CONFIRMED, SUGGEST FOLLOW UP  TESTING OF ANTITHROMBIN III LEVELS.   . WBC 11/04/2016 9.5  4.0 - 10.5 K/uL Final  . RBC 11/04/2016 3.36* 3.87 - 5.11 MIL/uL Final  . Hemoglobin 11/04/2016 8.5* 12.0 - 15.0 g/dL Final  . HCT 11/04/2016 26.0*  36.0 - 46.0 % Final  . MCV 11/04/2016 77.4* 78.0 - 100.0 fL Final  . MCH 11/04/2016 25.3* 26.0 - 34.0 pg Final  . MCHC 11/04/2016 32.7  30.0 - 36.0 g/dL Final  . RDW 11/04/2016 21.0* 11.5 - 15.5 % Final  . Platelets 11/04/2016 390  150 - 400 K/uL Final  . Heparin Unfractionated 11/04/2016 0.96* 0.30 - 0.70 IU/mL Final   Comment:        IF HEPARIN RESULTS ARE BELOW EXPECTED VALUES, AND PATIENT DOSAGE HAS BEEN CONFIRMED, SUGGEST FOLLOW UP TESTING OF ANTITHROMBIN III LEVELS.   Marland Kitchen Vitamin B-12 11/04/2016 881  180 - 914 pg/mL Final   Comment: (NOTE) This assay is not validated for testing neonatal or myeloproliferative syndrome specimens for Vitamin B12 levels. Performed at [facility], [place] [address] [place], [place] [address]   . Folate 11/04/2016 10.6  >5.9 ng/mL Final   Performed at [place] [address], [place], [place] [address]  . Retic Ct Pct 11/04/2016 4.3* 0.4 - 3.1 % Final  . RBC. 11/04/2016 3.47* 3.87 - 5.11 MIL/uL Final  . Retic Count, Absolute 11/04/2016 149.2  19.0 - 186.0 K/uL Final  . WBC 11/05/2016 8.8  4.0 - 10.5 K/uL Final  . RBC 11/05/2016 3.31* 3.87 - 5.11 MIL/uL Final  . Hemoglobin 11/05/2016 8.5* 12.0 - 15.0 g/dL Final  . HCT 11/05/2016 25.8* 36.0 - 46.0 % Final  . MCV 11/05/2016 77.9* 78.0 - 100.0 fL Final  . MCH 11/05/2016 25.7* 26.0 - 34.0 pg Final  . MCHC 11/05/2016 32.9  30.0 - 36.0 g/dL Final  . RDW 11/05/2016 20.8* 11.5 - 15.5 % Final  . Platelets 11/05/2016 403* 150 - 400 K/uL Final  . Weight 11/05/2016 2172.85  oz Final  . Height 11/05/2016 66  in Final  . BP 11/05/2016 105/56  mmHg Final  . Sodium 11/05/2016 138  135 - 145 mmol/L Final  . Potassium 11/05/2016 4.1  3.5 - 5.1 mmol/L Final  . Chloride 11/05/2016 103  101  - 111 mmol/L Final  . CO2 11/05/2016 27  22 - 32 mmol/L Final  . Glucose, Bld 11/05/2016 103* 65 - 99 mg/dL Final  . BUN 11/05/2016 11  6 - 20 mg/dL Final  . Creatinine, Ser 11/05/2016 0.66  0.44 - 1.00 mg/dL Final  . Calcium 11/05/2016 9.3  8.9 - 10.3 mg/dL Final  . GFR calc non Af Amer 11/05/2016 >60  >60 mL/min Final  . GFR calc Af Amer 11/05/2016 >60  >60 mL/min Final   Comment: (NOTE) The eGFR has been calculated using the CKD EPI equation. This calculation has not been validated in all clinical situations. eGFR's persistently <60 mL/min signify possible Chronic Kidney Disease.   . Anion gap 11/05/2016 8  5 - 15 Final  . CEA 11/05/2016 483.5* 0.0 - 4.7 ng/mL Final   Comment: (NOTE)       Roche ECLIA methodology       Nonsmokers  <3.9                                     Smokers     <5.6 Performed At: [facility] [place], [place] [id number] Lindon Romp MD EH:[id number]   . CA 27.29 11/05/2016 859.9* 0.0 - 38.6 U/mL Final   Comment: (NOTE) Specimen was diluted in order to obtain results. Results were repeated. [employer] [facility]/ACS  methodology Performed At: [facility] [place], [place] [id number] Lindon Romp MD [id number]   . Sodium 11/06/2016 138  135 - 145 mmol/L Final  . Potassium 11/06/2016 3.8  3.5 - 5.1 mmol/L Final  . Chloride 11/06/2016 100* 101 - 111 mmol/L Final  . CO2 11/06/2016 29  22 - 32 mmol/L Final  . Glucose, Bld 11/06/2016 110* 65 - 99 mg/dL Final  . BUN 11/06/2016 9  6 - 20 mg/dL Final  . Creatinine, Ser 11/06/2016 0.61  0.44 - 1.00 mg/dL Final  . Calcium 11/06/2016 10.0  8.9 - 10.3 mg/dL Final  . GFR calc non Af Amer 11/06/2016 >60  >60 mL/min Final  . GFR calc Af Amer 11/06/2016 >60  >60 mL/min Final   Comment: (NOTE) The eGFR has been calculated using the CKD EPI equation. This calculation has not been validated in all clinical situations. eGFR's persistently <60 mL/min signify possible  Chronic Kidney Disease.   . Anion gap 11/06/2016 9  5 - 15 Final     Lab Results  Component Value Date   IRON 63 11/03/2016   TIBC 266 11/03/2016   IRONPCTSAT 24 11/03/2016   (Iron and TIBC)  Lab Results  Component Value Date   FERRITIN 150 11/03/2016    Urinalysis    Component Value Date/Time   COLORURINE STRAW (A) 10/18/2016 2353   APPEARANCEUR CLEAR 10/18/2016 2353   LABSPEC 1.013 10/18/2016 2353   PHURINE 6.0 10/18/2016 2353   GLUCOSEU NEGATIVE 10/18/2016 2353   HGBUR NEGATIVE 10/18/2016 2353   BILIRUBINUR NEGATIVE 10/18/2016 2353   KETONESUR NEGATIVE 10/18/2016 2353   PROTEINUR NEGATIVE 10/18/2016 2353   UROBILINOGEN 0.2 02/20/2013 1609   NITRITE NEGATIVE 10/18/2016 2353   LEUKOCYTESUR NEGATIVE 10/18/2016 2353     STUDIES: Dg Chest 2 View  Result Date: 11/02/2016 CLINICAL DATA:  Chronic central chest pain, worsened EXAM: CHEST  2 VIEW COMPARISON:  Head CT 10/30/2016, radiograph 10/04/2016, CT 10/09/2016 FINDINGS: No acute pulmonary infiltrate, consolidation, or pleural effusion is seen. Surgical clips over the left chest. Post mastectomy changes on the left. Stable cardiomediastinal silhouette. Stable compression deformities of the thoracic and lumbar spine. Partially visualized left proximal humerus fracture. Possible healing left tenth rib fracture. Right tenth rib fracture. IMPRESSION: 1. No acute infiltrate or edema. 2. Stable appearance of spinal compression deformities. Acute right tenth rib fracture. Possible healing left tenth rib fracture. Electronically Signed   By: Donavan Foil M.D.   On: 11/02/2016 15:28   Ct Angio Chest Pe W And/or Wo Contrast  Result Date: 11/02/2016 CLINICAL DATA:  66 year old female with chronic chest wall pain and lung cancer EXAM: CT ANGIOGRAPHY CHEST WITH CONTRAST TECHNIQUE: Multidetector CT imaging of the chest was performed using the standard protocol during bolus administration of intravenous contrast. Multiplanar CT image  reconstructions and MIPs were obtained to evaluate the vascular anatomy. CONTRAST:  100 mL Isovue 370 COMPARISON:  Recent prior imaging including PET-CT 11/02/2016 ; prior chest CT 10/09/2016 FINDINGS: Cardiovascular: Aberrant right subclavian artery. No evidence of aortic aneurysm. Adequate opacification of the pulmonary arteries to the segmental level. Positive for a small filling defects within the postero basal segmental right lower lobe pulmonary artery. No additional pulmonary emboli are identified. The heart is normal in size. No pericardial effusion. Mediastinum/Nodes: Unremarkable CT appearance of the thyroid gland. No suspicious mediastinal or hilar adenopathy. No soft tissue mediastinal mass. The thoracic esophagus is unremarkable. Lungs/Pleura: 1 cm peripherally spiculated nodule in the right lower lobe. Mild  dependent atelectasis in the right lower lobe. No new focal airspace consolidation, pleural effusion, pneumothorax or pulmonary edema. Upper Abdomen: The visualized portion of the upper abdomen is unremarkable. Musculoskeletal: Surgical changes of prior left mastectomy and breast reconstruction. Stable T3 compression fracture. Compression fracture involving the inferior endplate of T8 is new compared to 10/09/2016. Nondisplaced pathologic fracture through the left aspect of the manubrium again identified. Incompletely imaged right tenth rib fracture. Known multifocal osseous metastatic disease is better demonstrated on the recent prior PET-CT. Review of the MIP images confirms the above findings. IMPRESSION: 1. Positive for isolated segmental pulmonary embolus in the right lower lobe postero basal segmental pulmonary artery. The solitary small pulmonary embolus may or may not be contributing to the patient's described clinical symptoms. 2. New T8 compression fracture compared to the CT scan of the chest dated 10/09/2016. 3. Stable T3 compression fracture, right tenth rib a pathologic fracture and  manubrial pathologic fracture. 4. Multifocal osseous metastatic disease better demonstrated on recent PET-CT. 5. Additional ancillary findings as above without interval change over the last 3 days. Electronically Signed   By: Jacqulynn Cadet M.D.   On: 11/02/2016 20:46   Mr Jeri Cos VV Contrast  Result Date: 11/06/2016 CLINICAL DATA:  Metastatic cancer, unknown primary. Headache. Acute pulmonary embolus. EXAM: MRI HEAD WITHOUT AND WITH CONTRAST TECHNIQUE: Multiplanar, multiecho pulse sequences of the brain and surrounding structures were obtained without and with intravenous contrast. CONTRAST:  55m MULTIHANCE GADOBENATE DIMEGLUMINE 529 MG/ML IV SOLN COMPARISON:  CT head 09/22/2015. FINDINGS: Brain: No evidence for acute infarction, hemorrhage, mass lesion, hydrocephalus, or extra-axial fluid. Borderline cerebral and cerebellar atrophy. Mild subcortical and periventricular T2 and FLAIR hyperintensities, likely chronic microvascular ischemic change. Post infusion, there is abnormal pachymeningeal enhancement over the LEFT frontal region adjacent to an area of abnormal marrow signal, concerning for metastatic disease. No significant edema in the adjacent frontal cortex. No parenchymal metastases are observed. Vascular: Flow voids are maintained throughout the carotid, basilar, and vertebral arteries. There are no areas of chronic hemorrhage. Skull and upper cervical spine: In the LEFT frontal bone, there is an area of osseous destruction, and abnormal signal, proximally is 7 x 20 mm cross-section as seen on series 7, image 18. This is adjacent to the area of abnormal dural enhancement. No other definite areas of calvarial disease. There is abnormal marrow signal in the distal clivus, suspected subcentimeter foci within C2, and there is abnormal signal replacing much of C4, and possibly C5. MRI of the cervical spine is recommended for further evaluation. No definite epidural tumor. Sinuses/Orbits: Minor paranasal  sinus disease.  No orbital findings. Other: None. Compared with prior CT, the LEFT frontal bone was normal   in 2017. 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 No brain edema or shift. No parenchymal metastases are observed. Concern for metastatic disease to the distal clivus, C2, C4, and C5. MRI of the cervical spine recommended for further evaluation. Borderline atrophy.  Mild small vessel disease. A call has been placed to the ordering provider. Electronically Signed   By: JStaci RighterM.D.   On: 11/06/2016 17:28   Nm Pet Image Initial (pi) Skull Base To Thigh  Result Date: 10/30/2016 CLINICAL DATA:  Initial treatment strategy for right lower lobe lung nodule. Remote history of breast cancer. Findings of osseous metastatic disease. EXAM: NUCLEAR MEDICINE PET SKULL BASE TO THIGH TECHNIQUE: 6.4 mCi F-18 FDG was injected intravenously. Full-ring PET imaging was  performed from the skull base to thigh after the radiotracer. CT data was obtained and used for attenuation correction and anatomic localization. FASTING BLOOD GLUCOSE:  Value: 119 mg/dl COMPARISON:  Chest CT from 10/09/2016 FINDINGS: NECK Oval-shaped subcutaneous lesion just below the right submandibular gland measures 1.7 by 1.6 cm on image 34/4 and has no accentuated metabolic activity. CHEST Focal interstitial accentuation with localized air cyst in the right upper lobe, image 20/7, maximum SUV 1.6. The medial right lower lobe lesion measures 9 mm in diameter on image 29/7 and has maximum standard uptake value of 3.3. There is some faint peripheral ground-glass opacity in the superior segment right lower lobe posteriorly with very low-grade accentuated activity. Probably inflammatory. Aberrant right subclavian artery passes behind the esophagus. Aortic atherosclerotic calcification. ABDOMEN/PELVIS Hypermetabolic right adrenal mass 1.6 by 2.7 cm on image 97/4, maximum  SUV 10.6. Focal calcifications in the pancreatic head, similar to 10/01/16 but increased from 05/23/2012. Maximum SUV in this region is 3.4 compared to typical pancreatic parenchymal activity of 2.1. Aortoiliac atherosclerotic vascular disease. SKELETON Widespread osseous metastatic disease. Index lesion involving the left glenoid and coracoid has a maximum SUV of 10.6. Index lesion in the anterior wall of the right acetabulum 12.5. Focal activity in the left pectoralis major muscle, maximum SUV 5.2, compatible with small metastatic deposit or possibly physiologic activity. Likely pathologic fracture the left proximal humerus. The most part the lesions are either lytic or indistinct on the CT data. Pathologic fracture of the right tenth rib. Suspected pathologic fracture of the left upper sternal manubrium. IMPRESSION: 1. Widespread hypermetabolic and lytic osseous metastatic disease, including pathologic fractures the left proximal humerus, right tenth rib, and left upper manubrium. 2. The right lower lobe nodule has a maximum standard uptake value of 3.3, favoring malignancy. 3. Hypermetabolic right adrenal mass, compatible with metastatic disease. 4. Subtle accentuated activity in the pancreatic head adjacent to chronic calcifications in this region. This could reflect low grade inflammation in setting of chronic calcific pancreatitis. Calcified pancreatic mass is considered less likely given the somewhat chronic appearance. 5. Questionable small metastatic focus in the left pectoralis major muscle. 6. Aortoiliac atherosclerotic vascular disease. Aberrant right subclavian artery passes behind the esophagus. Electronically Signed   By: Van Clines M.D.   On: 10/30/2016 13:35   Ct Biopsy  Result Date: 11/03/2016 INDICATION: 66 year old with the diffuse metastatic bone disease and history of breast cancer. Patient also has a concerning right lung nodule and right adrenal lesion. Tissue diagnosis is needed.  Recent diagnosis of pulmonary embolism. IV heparin was stopped prior to the procedure. EXAM: CT-GUIDED BIOPSY OF RIGHT PUBIC BONE LESION MEDICATIONS: None. ANESTHESIA/SEDATION: Moderate (conscious) sedation was employed during this procedure. A total of Versed 4.0 mg and Fentanyl 100 mcg was administered intravenously. Moderate Sedation Time: 18 minutes. The patient's level of consciousness and vital signs were monitored continuously by radiology nursing throughout the procedure under my direct supervision. FLUOROSCOPY TIME:  None COMPLICATIONS: None immediate. PROCEDURE: Informed written consent was obtained from the patient after a thorough discussion of the procedural risks, benefits and alternatives. All questions were addressed. A timeout was performed prior to the initiation of the procedure. Patient was placed supine on the CT scanner. Images of the pelvis were obtained. The destructive and lucent lesion in the anterior right pubic bone was targeted. Anterior pelvis was prepped and draped in sterile fashion. Skin and soft tissues were anesthetized with 1% lidocaine. 61 gauge needle was directed into the anterior aspect of the  lesion with CT guidance. A total of 4 fine-needle aspirations were performed with [place] and [place] needles. Small core samples were actually obtained from the aspirate needles along with aspirate material. Aspiration was also obtained from the 17 gauge needle as it was removed. Bandage placed over the puncture site. FINDINGS: Lucent lesion involving the anterior right pubic bone with some bone destruction along the anterior cortex. Biopsy needles were confirmed within the lucent lesion. No complicating features on the post biopsy imaging. IMPRESSION: CT-guided biopsy of the right pubic bone lesion. Electronically Signed   By: Markus Daft M.D.   On: 11/03/2016 17:11    ELIGIBLE FOR AVAILABLE RESEARCH PROTOCOL: no  ASSESSMENT: 66 y.o. [place] woman with remote history of breast  cancer, now with stage IV breast cancer involving bones, left axilla, Right adrenal gland, brain and possibly RLL lung, presenting July 2018             (a) CA 27-29 and CEA are elevated  (1) s/p remote left mastectomy with implant reconstruction; did not receive radiation or anti-estrogens  (2) METASTATIC WORKUP July 2018             (a) chest CT 10/09/2016 shows RLL 1.0 cm nodule, multiple lytic bone lesions/ compression fractures             (b) PET scan 10/30/2016 finds RLL lesion SUV = 3.3; right adrenal 2.7 cm mass with SUV 10.6; left pectoralis major lesion with SUV 5.2; multiple lytic bone lesions as noted before             (c) brain MRI 11/06/2016 shows left frontal pachymeningeal enhancement, skull lesions             (d) CA 27-29 was 859.9 and CEA 483 at baseline 11/05/2016             (e) 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  We reviewed the results of the bone marrow biopsy, now that we have the prognostic panel and unfortunately it shows triple negative disease. I have discussed the case with pathology. Dr. Lyndon Code tells me that fine-needle aspiration specimens generally include very little bone survey are not routinely decalcified. The point is that we can rely on the ER/PR and HER-2 readings in this case: He does not believe biopsying a soft tissue area elsewhere would give [place] any different results.  Accordingly we are stopping anastrozole and discussed chemotherapy. Talon understands her cancer is not curable but it is potentially controllable. Unfortunately chemotherapy is the only available systemic treatment. We discussed in general the  possible toxicities, side effects and complications of the chemotherapy agents we might use. Warren also understands that given her general comorbidities we would not be able to give her full dose treatment in many cases and therefore the effectiveness of chemotherapy would be compromised.  In short there is a real possibility that chemotherapy would primarily make her miserable and not significantly prolong her life and certainly not prolong her quality of life. After this discussion she was very clear that she did not want chemotherapy.  We discussed the area of the brain of concern and she would be willing to receive radiation to that area if we  felt it would be helpful. I think it would be in terms of prevention of seizures, possibly strokes, and generally I think her quality of life would be minimally impacted by the treatment.  We discussed hospice and palliative care. She is interested in this approach and understands the goal is not tumor control but keeping her as fit as possible as long as possible and in every case comfortable.  Accordingly we reviewed her medications in detail. She is getting significant benefit from the Robaxin and I was glad to refill that for her. I been some insurance issues regarding this. I am dropping her MS Contin to 30 mg 3 times a day and keeping the MSIR 30 mg up to 4 times a day as needed.  She does not have a bowel prophylaxis regimen in place. I asked her to start MiraLAX daily and a stool softener 2 tablets daily. She will let us know if this is not effective.  She requested a hospital bed and a bedside commode which I was glad to order for her through home health. We also placing a referral to in home palliative care and if and when she completes radiation we can move to full hospice at that point.  [place] was appropriately distressed and tearful regarding the overall situation. I quoted her a 50-50 chance of being alive in 6 months and she understands this  does not mean she will live 6 months or only lives 6 months. I do think we can make a significant positive impact in her quality of life.   Her case should be presented at the brain tumor conference 11/23/2016. She will let me know within the next week or so whether or not she has received the items she has requested and whether or not her pain is controlled. She will then see me again in approximately one month.   Chauncey Cruel, MD   11/18/2016 8:03 PM Medical Oncology and Hematology [facility] [address] [place], [place] [address] Tel. [phone]    Fax. [phone]

## 2016-11-15 NOTE — Progress Notes (Unsigned)
The prognostic panel on the bone biopsy 11/03/2016 shows a triple negative metastatic adenocarcinoma.  We are proceeding with denosumab/Xgeva as scheduled. However we will obtain a biopsy of a right adrenal mass or left pectoralis major lesion to confirm estrogen receptor negativity, given the fact that decalcification can make the prognostic panel somewhat questionable when the results are negative.  She already has an appointment with me 11/18/2016 and we will discuss this in detail at that time.

## 2016-11-18 ENCOUNTER — Telehealth: Payer: Self-pay | Admitting: Physician Assistant

## 2016-11-18 ENCOUNTER — Ambulatory Visit (HOSPITAL_BASED_OUTPATIENT_CLINIC_OR_DEPARTMENT_OTHER): Payer: Medicare Other | Admitting: Oncology

## 2016-11-18 ENCOUNTER — Ambulatory Visit: Payer: Medicare Other

## 2016-11-18 VITALS — BP 116/52 | HR 111 | Temp 100.0°F | Resp 17 | Ht 66.0 in | Wt 130.1 lb

## 2016-11-18 DIAGNOSIS — C50912 Malignant neoplasm of unspecified site of left female breast: Secondary | ICD-10-CM | POA: Diagnosis not present

## 2016-11-18 DIAGNOSIS — C773 Secondary and unspecified malignant neoplasm of axilla and upper limb lymph nodes: Secondary | ICD-10-CM | POA: Diagnosis not present

## 2016-11-18 DIAGNOSIS — C7951 Secondary malignant neoplasm of bone: Secondary | ICD-10-CM

## 2016-11-18 DIAGNOSIS — C7931 Secondary malignant neoplasm of brain: Secondary | ICD-10-CM | POA: Insufficient documentation

## 2016-11-18 DIAGNOSIS — C7971 Secondary malignant neoplasm of right adrenal gland: Secondary | ICD-10-CM | POA: Diagnosis not present

## 2016-11-18 NOTE — Telephone Encounter (Signed)
Robaxin is not prescribed by this office I called to advise the pt of this but there was no answer and no voice mail.

## 2016-11-19 ENCOUNTER — Other Ambulatory Visit: Payer: Self-pay

## 2016-11-19 DIAGNOSIS — C7931 Secondary malignant neoplasm of brain: Secondary | ICD-10-CM

## 2016-11-19 DIAGNOSIS — C799 Secondary malignant neoplasm of unspecified site: Secondary | ICD-10-CM

## 2016-11-19 DIAGNOSIS — C7951 Secondary malignant neoplasm of bone: Secondary | ICD-10-CM

## 2016-11-20 NOTE — Telephone Encounter (Signed)
No answer no voice mail.  Will wait for further communication from the pt

## 2016-11-23 ENCOUNTER — Ambulatory Visit: Payer: Medicare Other | Admitting: Radiation Oncology

## 2016-11-24 NOTE — Telephone Encounter (Signed)
Patient husband left message with answering service on 11/23/16 at 5:51pm stating tht he needs to know is there any further steps the patient can take to help her. I spoke to patient and she is unsure as to why her husband called.

## 2016-11-25 ENCOUNTER — Other Ambulatory Visit: Payer: Self-pay

## 2016-11-25 ENCOUNTER — Telehealth: Payer: Self-pay

## 2016-11-25 DIAGNOSIS — C799 Secondary malignant neoplasm of unspecified site: Secondary | ICD-10-CM

## 2016-11-25 DIAGNOSIS — C7931 Secondary malignant neoplasm of brain: Secondary | ICD-10-CM

## 2016-11-25 DIAGNOSIS — C7951 Secondary malignant neoplasm of bone: Secondary | ICD-10-CM

## 2016-11-25 NOTE — Telephone Encounter (Signed)
I spoke with the wife and she states she has no questions or concerns at this time.

## 2016-11-25 NOTE — Telephone Encounter (Signed)
Referral for dme and palliative care orders have been faxed to [facility]

## 2016-12-01 ENCOUNTER — Other Ambulatory Visit: Payer: Self-pay | Admitting: *Deleted

## 2016-12-01 NOTE — Telephone Encounter (Signed)
"  Emily Cohen calling for my wife.  She missed a call on 11-25-2016 at 12:30 pm.  Was this for an appointment or is anything pending or outstanding for her?  We both have short term memory problems." (No nothing pending.  Do you have any home needs or care needs at this time?) No, [facility] has already delivered a hospital bed, commode and equipment.  She has a Designer, jewellery that comes once a week to the home.  What is the next appointment for?  I will need to arrange transportation."  Instructed to arrange transportation to arrive 12-22-2016 at 9:00 to begin registation process for injection and provider visit.  No further questions.

## 2016-12-02 ENCOUNTER — Telehealth: Payer: Self-pay

## 2016-12-02 NOTE — Telephone Encounter (Signed)
Received a call from Gilliam, her primary care giver and CNA.  She is there 2 hours/day, 7 days/ week.  The pt needs more assistance at home. She is falling almost every day, she is incontinent, she is not preparing herself any food, she is not eating (like 1 bite of a sandwich).    She did get a hospital bed, she did get a shower chair. She was not contacted by [facility].  She does have a roommate who does not help. He [place] on [place] and she lives on the other. He is bipolar.  She is getting an assessment from [facility] next week to see if she can have services of [facility] for another year. &/or more assistance  Per OV note dated 8/8 she was supposed to have been presented at the brain tumor conference on 8/13. She has not heard back if she will get radiation to her brain.   Called hospice and they do not have record of referral. Their fax is [phone]. Per telephone note of 11/18/16 gave Manus Gunning, referral intake person, a VO for referral. Asked hospice provider to assist and Dr Jana Hakim will be attending.

## 2016-12-03 NOTE — Telephone Encounter (Signed)
This RN called to Emily Cohen - post verifying the status of possible radiation.  Pt will not be obtaining radiation per outcome of pt being presented at the Brain tumor board per [place] RN.  Per call was transferred to [facility] for Emily Cohen.  Message left informing Emily Cohen to proceed with Hospice services .  This RN's direct return call number left for contact if needed.

## 2016-12-04 ENCOUNTER — Telehealth: Payer: Self-pay

## 2016-12-04 NOTE — Telephone Encounter (Signed)
Pt requests [facility] instead of [facility] due to location.

## 2016-12-04 NOTE — Telephone Encounter (Signed)
Emily Cohen with [facility] called that pt requested hospice of  for her service provider. Emily Cohen is forwarding the referral. This is an FYI call and hospice [place] may be calling.

## 2016-12-04 NOTE — Telephone Encounter (Signed)
Thank you :)

## 2016-12-09 ENCOUNTER — Telehealth: Payer: Self-pay | Admitting: *Deleted

## 2016-12-09 NOTE — Telephone Encounter (Signed)
This RN received VM from " this is Emily Cohen's husband and I need a call because of how quickly she is declining " " I need a call from the doctor to explain this to me better "  Return call number given as [phone].  This RN returned call and spoke to Emily Cohen - note he immediately asked " why can I not speak to the doctor ?"  This RN informed him- the MD was with patients in the office and I am calling to get more information which may be helpful.  Emily Cohen stated " I just don't understand why Emily Cohen is declining so fast "   " I know the doctor said 3 months but I don't understand why you are not giving her medications or treatment to keep her alive " " I need a phone consultation "  Per call this RN allowed Emily Cohen to state his concerns including asking him to tell me what he has been seeing over the last few weeks-  Emily Cohen stated concerns that the " cancer wasn't treated soon enough - that doctor ( not named ) should have checked her sooner "  " how did this happen so fast when just a few weeks ago we were walking around the store and now she can barely talk- and can't bare her weight or even hold a glass "  Note - Emily Cohen's voice became more agitated and terse in nature with demands of " I just need to hear from the horse's mouth what is happening and why so fast "  " hospice came Monday and picked her up and now she can barely talk "-  " I got a wheelchair and someone needs to go get her and give her medicine to keep her alive "  This RN validated his concerns and feelings- as well as discussed need for MD review of his concerns so his questions can be addressed best.  Emily Cohen stated understanding and call ended.  This RN then called to [facility] and spoke with the attending nurse - Arbie Cookey - per above.   She stated Emily Cohen also called them- with same concerns and abruptness in questions - Arbie Cookey spoke with him as well and advised him to come the facility so he could meet with staff to have his  questions better addressed. Arbie Cookey did state pt is declining with probable terminal agitation.  This RN then discussed above with MD- who verified per visit with pt 11/18/2016 that pt did not want life sustaining therapy if it would not improve quality.  This RN returned call to Emily Cohen to discuss his concerns further due to MD not available at this time for phone consultation.  This RN stated concerns were reviewed with the doctor who wanted this nurse to inform him of discussion per visit 11/18/2016-  This RN read from the dictation choices given to patient regarding therapy.  Informed him that the radiation would not be of benefit to improve her quality of life as well as reading that the patient stating to MD she did not want chemotherapy that would not benefit her.  Emily Cohen then became tearful stating " she has stated several times that she wanted to die over the past few months "  This RN again gave affirmation of changes occurring very quickly to him - and validation of his feelings .  Emily Cohen then stated " I respect that you are honoring what Emily Cohen said and that she could make this decision "  This RN  encouraged Emily Cohen to go to [facility] - though it may be difficult - to be present during this time. Encouraged him to talk to the staff and obtain support and understanding as patient will likely pass soon.  Emily Cohen stated understanding and appreciation - then stated " I got another call " with this call ending.

## 2016-12-12 DEATH — deceased

## 2016-12-22 ENCOUNTER — Ambulatory Visit: Payer: Medicare Other

## 2016-12-22 ENCOUNTER — Ambulatory Visit: Payer: Medicare Other | Admitting: Oncology

## 2018-10-07 IMAGING — CR DG RIBS W/ CHEST 3+V*L*
5 series · 5 of 5 positions shown · non-contrast
Comparison: None.

CLINICAL DATA: Left below diaphragm anterior to lateral rib pain x
1 month with some SOB.(Pt. Fell in April 2016 and injured left
humerus.) No new fall, says patient.

EXAM:
LEFT RIBS AND CHEST - 3+ VIEW

[rib pa obl]
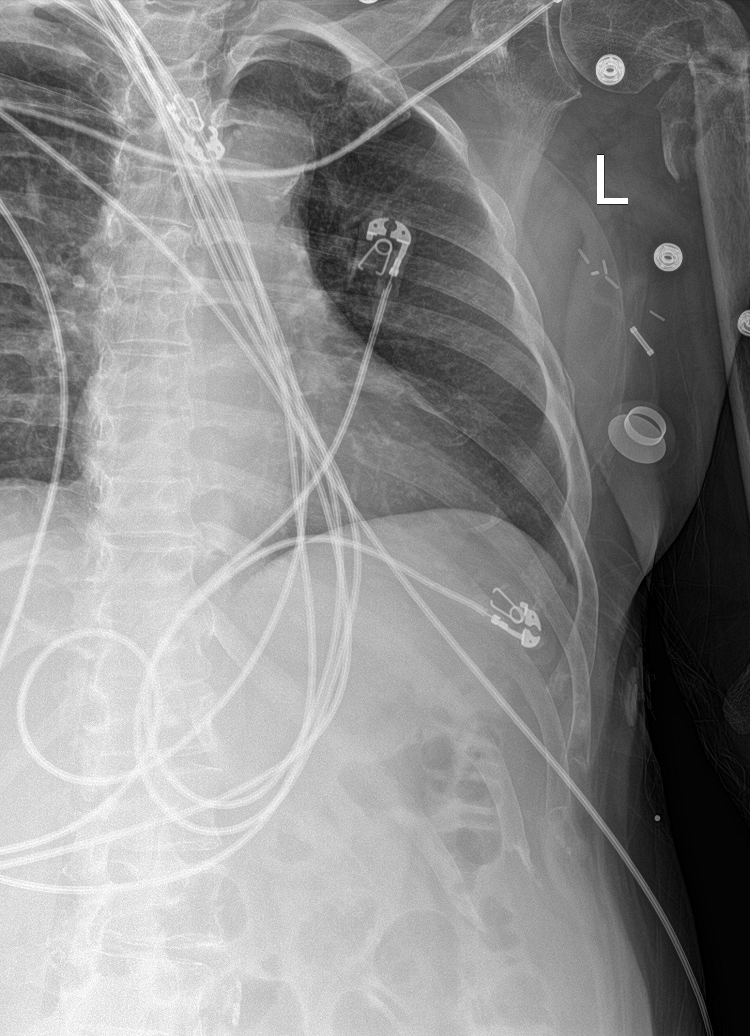

[rib pa]
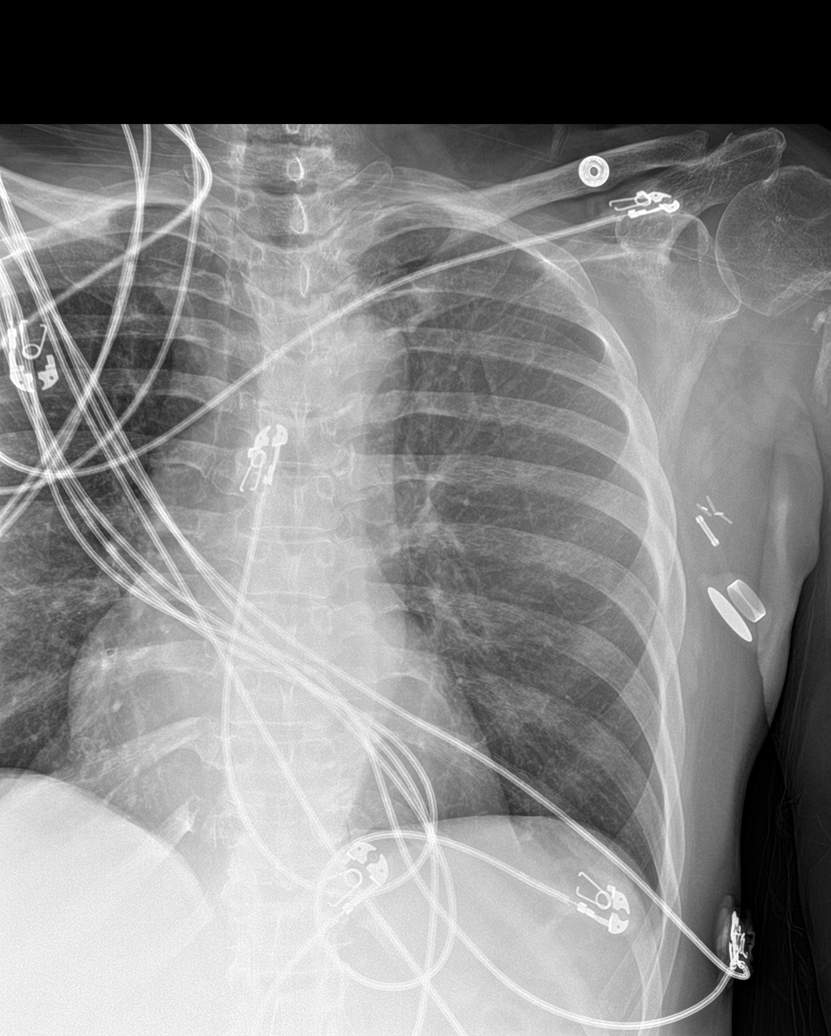

[chest ap]
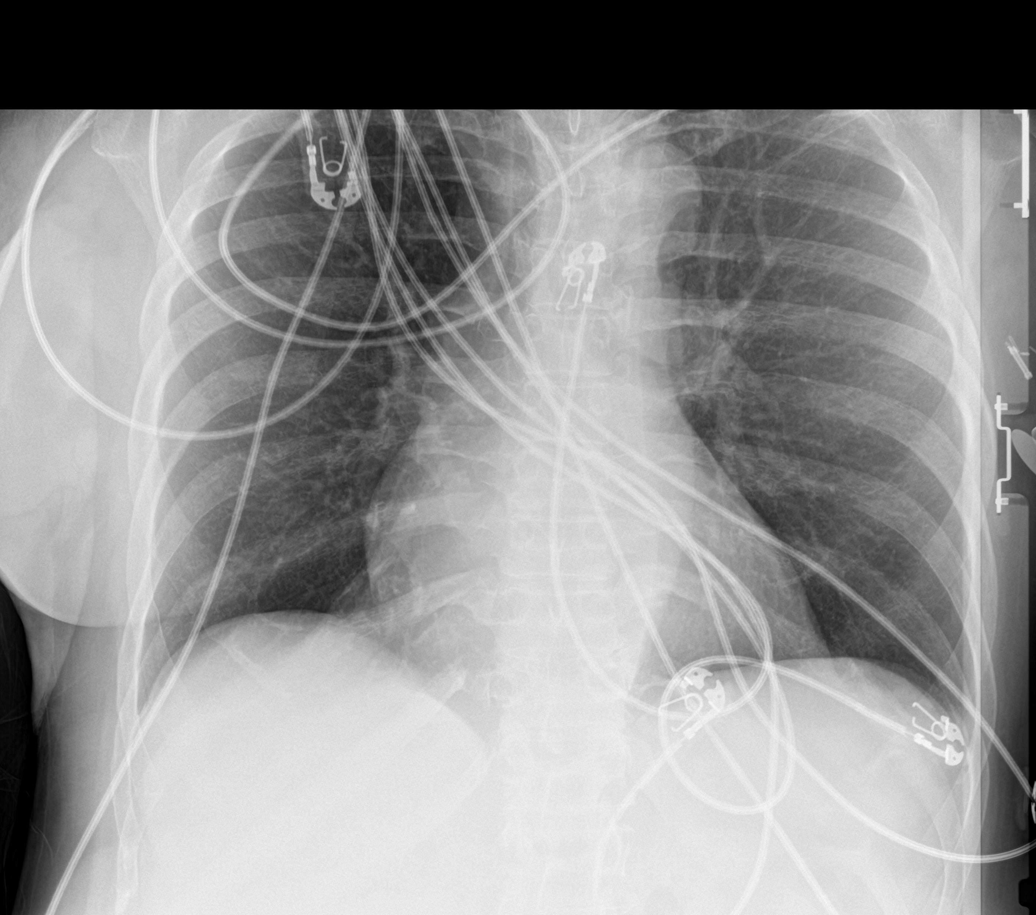

[rib ap]
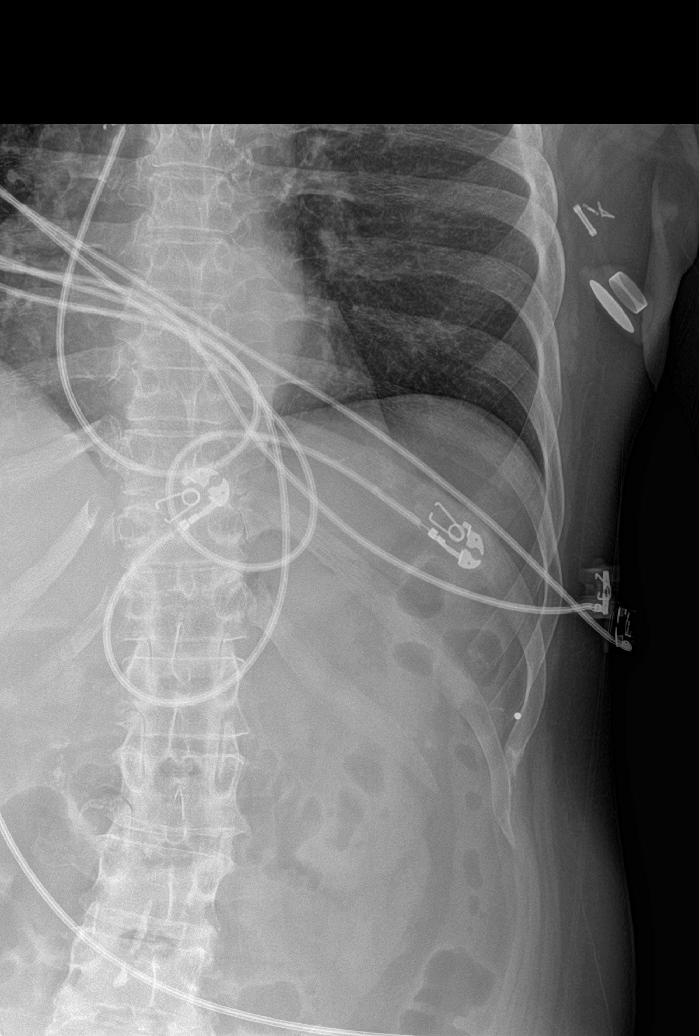

[rib ap obl]
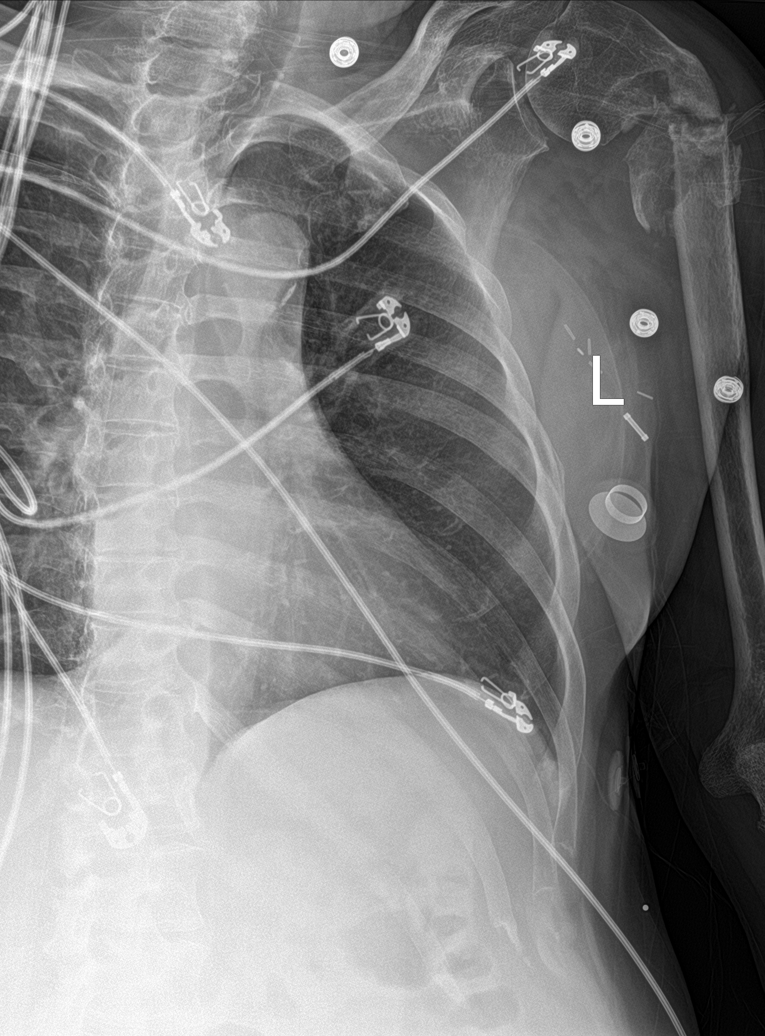

[5 of 5 positions shown; findings below may reference images not displayed]

FINDINGS: No rib fracture or rib lesion.

Heart, mediastinum and hila are unremarkable. Lungs are clear. No
pleural effusion or pneumothorax.
IMPRESSION: Negative.
